# Patient Record
Sex: Female | Born: 1975 | Race: Black or African American | Hispanic: No | Marital: Married | Smoking: Never smoker
Health system: Southern US, Community
[De-identification: ages and names within clinical notes are randomized; demographics above are authoritative.]

## PROBLEM LIST (undated history)

## (undated) DIAGNOSIS — C50919 Malignant neoplasm of unspecified site of unspecified female breast: Secondary | ICD-10-CM

## (undated) DIAGNOSIS — D219 Benign neoplasm of connective and other soft tissue, unspecified: Secondary | ICD-10-CM

## (undated) DIAGNOSIS — D649 Anemia, unspecified: Secondary | ICD-10-CM

## (undated) HISTORY — PX: BREAST RECONSTRUCTION: SHX9

## (undated) HISTORY — PX: TUBAL LIGATION: SHX77

## (undated) HISTORY — PX: MASTECTOMY: SHX3

## (undated) HISTORY — PX: BREAST REDUCTION SURGERY: SHX8

## (undated) HISTORY — PX: GASTRIC BYPASS: SHX52

---
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
## 2012-01-25 ENCOUNTER — Encounter (HOSPITAL_COMMUNITY): Payer: Self-pay | Admitting: *Deleted

## 2012-01-25 ENCOUNTER — Emergency Department (HOSPITAL_COMMUNITY)
Admission: EM | Admit: 2012-01-25 | Discharge: 2012-01-25 | Disposition: A | Discharge status: Home / Self Care | Payer: Self-pay | Attending: Emergency Medicine | Admitting: Emergency Medicine

## 2012-01-25 DIAGNOSIS — Z853 Personal history of malignant neoplasm of breast: Secondary | ICD-10-CM | POA: Insufficient documentation

## 2012-01-25 DIAGNOSIS — N390 Urinary tract infection, site not specified: Secondary | ICD-10-CM | POA: Insufficient documentation

## 2012-01-25 HISTORY — DX: Malignant neoplasm of unspecified site of unspecified female breast: C50.919

## 2012-01-25 LAB — URINE MICROSCOPIC-ADD ON

## 2012-01-25 LAB — PREGNANCY, URINE: Preg Test, Ur: NEGATIVE

## 2012-01-25 LAB — URINALYSIS, ROUTINE W REFLEX MICROSCOPIC
Bilirubin Urine: NEGATIVE
Glucose, UA: NEGATIVE mg/dL
Ketones, ur: NEGATIVE mg/dL
Nitrite: NEGATIVE
Protein, ur: NEGATIVE mg/dL
Specific Gravity, Urine: 1.018 (ref 1.005–1.030)
Urobilinogen, UA: 1 mg/dL (ref 0.0–1.0)
pH: 7 (ref 5.0–8.0)

## 2012-01-25 MED ORDER — CEPHALEXIN 500 MG PO CAPS: 500.0000 mg | ORAL_CAPSULE | Freq: Four times a day (QID) | ORAL | Status: AC

## 2012-01-25 MED ORDER — PHENAZOPYRIDINE HCL 100 MG PO TABS
200.0000 mg | ORAL_TABLET | Freq: Once | ORAL | Status: AC
  Administered 2012-01-25: 200 mg via ORAL
  Filled 2012-01-25: qty 2

## 2012-01-25 MED ORDER — PHENAZOPYRIDINE HCL 200 MG PO TABS: 200.0000 mg | ORAL_TABLET | Freq: Three times a day (TID) | ORAL | Status: AC

## 2012-01-25 MED ORDER — CEPHALEXIN 250 MG PO CAPS
500.0000 mg | ORAL_CAPSULE | Freq: Once | ORAL | Status: AC
  Administered 2012-01-25: 500 mg via ORAL
  Filled 2012-01-25: qty 2

## 2012-01-25 NOTE — ED Provider Notes (Signed)
History    This chart was scribed for Suzi Roots, MD, MD by Smitty Pluck. The patient was seen in room STRE6 and the patient's care was started at 11:53AM.   CSN: [id number]  Arrival date & time 01/25/12  1123   First MD Initiated Contact with Patient 01/25/12 1149      Chief Complaint  Patient presents with  . Urinary Tract Infection    (Consider location/radiation/quality/duration/timing/severity/associated sxs/prior treatment) Patient is a 36 y.o. female presenting with urinary tract infection. The history is provided by the patient.  Urinary Tract Infection   Cheryl Holloway is a 36 y.o. female who presents to the Emergency Department complaining of moderate dysuria onset 1 week ago. Denies back pain, vomiting and nausea. Reports LMP was beginning of this month. Pt reports having vaginal spotting. Denies abdominal pain. Reports increased pressure when she voids. Symptoms have been constant since onset without radiation. Denies burning while urinating. Denies any other pain.  No abd pain. No vaginal discharge. No nv. No flank pain. No fever or chills. Hx uncomplicated uti yrs ago.     Past Medical History  Diagnosis Date  . Breast cancer     Past Surgical History  Procedure Date  . Mastectomy   . Breast reconstruction   . Breast reduction surgery     No family history on file.  History  Substance Use Topics  . Smoking status: Never Smoker   . Smokeless tobacco: Not on file  . Alcohol Use: Yes    OB History    Grav Para Term Preterm Abortions TAB SAB Ect Mult Living                  Review of Systems  Constitutional: Negative for fever and chills.  Respiratory: Negative for cough.   Gastrointestinal: Negative for nausea, vomiting and diarrhea.  Genitourinary: Positive for dysuria. Negative for urgency, frequency and flank pain.    Allergies  Review of patient's allergies indicates no known allergies.  Home Medications   Current Outpatient Rx    Name Route Sig Dispense Refill  . OMEGA-3 FATTY ACIDS 1000 MG PO CAPS Oral Take 1 g by mouth daily.    . IRON PO Oral Take 1 tablet by mouth daily.    . ADULT MULTIVITAMIN W/MINERALS CH Oral Take 1 tablet by mouth daily.    Marland Kitchen TAMOXIFEN CITRATE 20 MG PO TABS Oral Take 20 mg by mouth daily.    Marland Kitchen VITAMIN B-12 500 MCG PO TABS Oral Take 500 mcg by mouth daily.    Marland Kitchen VITAMIN E PO Oral Take 1 capsule by mouth daily.      BP 132/88  Pulse 69  Temp(Src) 98.2 F (36.8 C) (Oral)  Resp 16  SpO2 99%  Physical Exam  Nursing note and vitals reviewed. Constitutional: She is oriented to person, place, and time. She appears well-developed and well-nourished. No distress.  HENT:  Head: Normocephalic and atraumatic.  Eyes: Pupils are equal, round, and reactive to light.  Neck: Normal range of motion. Neck supple. No tracheal deviation present.  Cardiovascular: Normal rate.   Pulmonary/Chest: Effort normal. No respiratory distress.  Abdominal: Soft. Bowel sounds are normal. She exhibits no distension. There is no tenderness. There is no CVA tenderness.  Genitourinary:       No cva tenderness  Musculoskeletal: She exhibits no edema.  Neurological: She is alert and oriented to person, place, and time.  Skin: Skin is warm and dry.  Psychiatric: She has a  normal mood and affect. Her behavior is normal.    ED Course  Procedures (including critical care time) DIAGNOSTIC STUDIES: Oxygen Saturation is 99% on room air, normal by my interpretation.    COORDINATION OF CARE: 11:57AM EDP discusses pt ED treatment with pt.  12:41PM EDP orders medication: keflex 500 mg, pyridium 200 mg.  Labs Reviewed  URINALYSIS, ROUTINE W REFLEX MICROSCOPIC - Abnormal; Notable for the following:    APPearance CLOUDY (*)    Hgb urine dipstick TRACE (*)    Leukocytes, UA LARGE (*)    All other components within normal limits  URINE MICROSCOPIC-ADD ON - Abnormal; Notable for the following:    Squamous Epithelial / LPF  FEW (*)    Bacteria, UA FEW (*)    All other components within normal limits  PREGNANCY, URINE     MDM  I personally performed the services described in this documentation, which was scribed in my presence. The recorded information has been reviewed and considered. Suzi Roots, MD   Pos ua. Nkda. Keflex po. Pyridium po.   Discussed labs w pt.        Suzi Roots, MD 01/25/12 1314

## 2012-01-25 NOTE — ED Notes (Signed)
Patient states she has discomfort and pressure when she voids.  She reports sx for 1 week

## 2012-01-25 NOTE — Discharge Instructions (Signed)
Take antibiotic (keflex) as prescribed. Take pyridium as need for bladder spasm/pain. Take motrin or aleve as need. Follow up with primary care doctor in the next few days if symptoms fail to improve/resolve. Return to ER if worse, severe abdominal pain, vomiting, fevers, other concern.      Urinary Tract Infection Infections of the urinary tract can start in several places. A bladder infection (cystitis), a kidney infection (pyelonephritis), and a prostate infection (prostatitis) are different types of urinary tract infections (UTIs). They usually get better if treated with medicines (antibiotics) that kill germs. Take all the medicine until it is gone. You or your child may feel better in a few days, but TAKE ALL MEDICINE or the infection may not respond and may become more difficult to treat. HOME CARE INSTRUCTIONS   Drink enough water and fluids to keep the urine clear or pale yellow. Cranberry juice is especially recommended, in addition to large amounts of water.   Avoid caffeine, tea, and carbonated beverages. They tend to irritate the bladder.   Alcohol may irritate the prostate.   Only take over-the-counter or prescription medicines for pain, discomfort, or fever as directed by your caregiver.  To prevent further infections:  Empty the bladder often. Avoid holding urine for long periods of time.   After a bowel movement, women should cleanse from front to back. Use each tissue only once.   Empty the bladder before and after sexual intercourse.  FINDING OUT THE RESULTS OF YOUR TEST Not all test results are available during your visit. If your or your child's test results are not back during the visit, make an appointment with your caregiver to find out the results. Do not assume everything is normal if you have not heard from your caregiver or the medical facility. It is important for you to follow up on all test results. SEEK MEDICAL CARE IF:   There is back pain.   Your baby  is older than 3 months with a rectal temperature of 100.5 F (38.1 C) or higher for more than 1 day.   Your or your child's problems (symptoms) are no better in 3 days. Return sooner if you or your child is getting worse.  SEEK IMMEDIATE MEDICAL CARE IF:   There is severe back pain or lower abdominal pain.   You or your child develops chills.   You have a fever.   Your baby is older than 3 months with a rectal temperature of 102 F (38.9 C) or higher.   Your baby is 25 months old or younger with a rectal temperature of 100.4 F (38 C) or higher.   There is nausea or vomiting.   There is continued burning or discomfort with urination.  MAKE SURE YOU:   Understand these instructions.   Will watch your condition.   Will get help right away if you are not doing well or get worse.  Document Released: 05/12/2005 Document Revised: 07/22/2011 Document Reviewed: 12/15/2006 [facility] Patient Information 2012 [place], [place].

## 2013-08-20 ENCOUNTER — Emergency Department (HOSPITAL_COMMUNITY)
Admission: EM | Admit: 2013-08-20 | Discharge: 2013-08-20 | Disposition: A | Discharge status: Home / Self Care | Payer: Self-pay | Attending: Emergency Medicine | Admitting: Emergency Medicine

## 2013-08-20 ENCOUNTER — Encounter (HOSPITAL_COMMUNITY): Payer: Self-pay | Admitting: Emergency Medicine

## 2013-08-20 DIAGNOSIS — R61 Generalized hyperhidrosis: Secondary | ICD-10-CM | POA: Insufficient documentation

## 2013-08-20 DIAGNOSIS — C50919 Malignant neoplasm of unspecified site of unspecified female breast: Secondary | ICD-10-CM | POA: Insufficient documentation

## 2013-08-20 DIAGNOSIS — R634 Abnormal weight loss: Secondary | ICD-10-CM | POA: Insufficient documentation

## 2013-08-20 DIAGNOSIS — R5381 Other malaise: Secondary | ICD-10-CM | POA: Insufficient documentation

## 2013-08-20 DIAGNOSIS — R110 Nausea: Secondary | ICD-10-CM | POA: Insufficient documentation

## 2013-08-20 DIAGNOSIS — R5383 Other fatigue: Secondary | ICD-10-CM | POA: Insufficient documentation

## 2013-08-20 DIAGNOSIS — N644 Mastodynia: Secondary | ICD-10-CM | POA: Insufficient documentation

## 2013-08-20 DIAGNOSIS — Z3202 Encounter for pregnancy test, result negative: Secondary | ICD-10-CM | POA: Insufficient documentation

## 2013-08-20 LAB — URINALYSIS, ROUTINE W REFLEX MICROSCOPIC
BILIRUBIN URINE: NEGATIVE
GLUCOSE, UA: NEGATIVE mg/dL
Hgb urine dipstick: NEGATIVE
Ketones, ur: NEGATIVE mg/dL
LEUKOCYTES UA: NEGATIVE
NITRITE: NEGATIVE
PH: 6 (ref 5.0–8.0)
Protein, ur: NEGATIVE mg/dL
SPECIFIC GRAVITY, URINE: 1.017 (ref 1.005–1.030)
Urobilinogen, UA: 0.2 mg/dL (ref 0.0–1.0)

## 2013-08-20 LAB — COMPREHENSIVE METABOLIC PANEL
ALBUMIN: 2.8 g/dL — AB (ref 3.5–5.2)
ALK PHOS: 71 U/L (ref 39–117)
ALT: 53 U/L — ABNORMAL HIGH (ref 0–35)
AST: 114 U/L — AB (ref 0–37)
BUN: 9 mg/dL (ref 6–23)
CHLORIDE: 102 meq/L (ref 96–112)
CO2: 26 mEq/L (ref 19–32)
Calcium: 7.9 mg/dL — ABNORMAL LOW (ref 8.4–10.5)
Creatinine, Ser: 0.68 mg/dL (ref 0.50–1.10)
GFR calc Af Amer: >90 mL/min (ref >90)
GFR calc non Af Amer: >90 mL/min (ref >90)
Glucose, Bld: 99 mg/dL (ref 70–99)
POTASSIUM: 3.8 meq/L (ref 3.7–5.3)
Sodium: 140 mEq/L (ref 137–147)
TOTAL PROTEIN: 6.4 g/dL (ref 6.0–8.3)
Total Bilirubin: 0.5 mg/dL (ref 0.3–1.2)

## 2013-08-20 LAB — CBC WITH DIFFERENTIAL/PLATELET
BASOS ABS: 0 10*3/uL (ref 0.0–0.1)
BASOS PCT: 1 % (ref 0–1)
EOS ABS: 0 10*3/uL (ref 0.0–0.7)
Eosinophils Relative: 0 % (ref 0–5)
HCT: 31.5 % — ABNORMAL LOW (ref 36.0–46.0)
HEMOGLOBIN: 10.9 g/dL — AB (ref 12.0–15.0)
Lymphocytes Relative: 22 % (ref 12–46)
Lymphs Abs: 0.9 10*3/uL (ref 0.7–4.0)
MCH: 27.1 pg (ref 26.0–34.0)
MCHC: 34.6 g/dL (ref 30.0–36.0)
MCV: 78.4 fL (ref 78.0–100.0)
MONOS PCT: 8 % (ref 3–12)
Monocytes Absolute: 0.3 10*3/uL (ref 0.1–1.0)
NEUTROS ABS: 2.7 10*3/uL (ref 1.7–7.7)
NEUTROS PCT: 69 % (ref 43–77)
PLATELETS: 255 10*3/uL (ref 150–400)
RBC: 4.02 MIL/uL (ref 3.87–5.11)
RDW: 15.1 % (ref 11.5–15.5)
WBC: 3.9 10*3/uL — ABNORMAL LOW (ref 4.0–10.5)

## 2013-08-20 LAB — PREGNANCY, URINE: Preg Test, Ur: NEGATIVE

## 2013-08-20 LAB — LIPASE, BLOOD: LIPASE: 20 U/L (ref 11–59)

## 2013-08-20 NOTE — Discharge Instructions (Signed)
Fatigue °Fatigue is a feeling of tiredness, lack of energy, lack of motivation, or feeling tired all the time. Having enough rest, good nutrition, and reducing stress will normally reduce fatigue. Consult your caregiver if it persists. The nature of your fatigue will help your caregiver to find out its cause. The treatment is based on the cause.  °CAUSES  °There are many causes for fatigue. Most of the time, fatigue can be traced to one or more of your habits or routines. Most causes fit into one or more of three general areas. They are: °Lifestyle problems °· Sleep disturbances. °· Overwork. °· Physical exertion. °· Unhealthy habits. °· Poor eating habits or eating disorders. °· Alcohol and/or drug use . °· Lack of proper nutrition (malnutrition). °Psychological problems °· Stress and/or anxiety problems. °· Depression. °· Grief. °· Boredom. °Medical Problems or Conditions °· Anemia. °· Pregnancy. °· Thyroid gland problems. °· Recovery from major surgery. °· Continuous pain. °· Emphysema or asthma that is not well controlled °· Allergic conditions. °· Diabetes. °· Infections (such as mononucleosis). °· Obesity. °· Sleep disorders, such as sleep apnea. °· Heart failure or other heart-related problems. °· Cancer. °· Kidney disease. °· Liver disease. °· Effects of certain medicines such as antihistamines, cough and cold remedies, prescription pain medicines, heart and blood pressure medicines, drugs used for treatment of cancer, and some antidepressants. °SYMPTOMS  °The symptoms of fatigue include:  °· Lack of energy. °· Lack of drive (motivation). °· Drowsiness. °· Feeling of indifference to the surroundings. °DIAGNOSIS  °The details of how you feel help guide your caregiver in finding out what is causing the fatigue. You will be asked about your present and past health condition. It is important to review all medicines that you take, including prescription and non-prescription items. A thorough exam will be done.  You will be questioned about your feelings, habits, and normal lifestyle. Your caregiver may suggest blood tests, urine tests, or other tests to look for common medical causes of fatigue.  °TREATMENT  °Fatigue is treated by correcting the underlying cause. For example, if you have continuous pain or depression, treating these causes will improve how you feel. Similarly, adjusting the dose of certain medicines will help in reducing fatigue.  °HOME CARE INSTRUCTIONS  °· Try to get the required amount of good sleep every night. °· Eat a healthy and nutritious diet, and drink enough water throughout the day. °· Practice ways of relaxing (including yoga or meditation). °· Exercise regularly. °· Make plans to change situations that cause stress. Act on those plans so that stresses decrease over time. Keep your work and personal routine reasonable. °· Avoid street drugs and minimize use of alcohol. °· Start taking a daily multivitamin after consulting your caregiver. °SEEK MEDICAL CARE IF:  °· You have persistent tiredness, which cannot be accounted for. °· You have fever. °· You have unintentional weight loss. °· You have headaches. °· You have disturbed sleep throughout the night. °· You are feeling sad. °· You have constipation. °· You have dry skin. °· You have gained weight. °· You are taking any new or different medicines that you suspect are causing fatigue. °· You are unable to sleep at night. °· You develop any unusual swelling of your legs or other parts of your body. °SEEK IMMEDIATE MEDICAL CARE IF:  °· You are feeling confused. °· Your vision is blurred. °· You feel faint or pass out. °· You develop severe headache. °· You develop severe abdominal, pelvic, or   back pain. °· You develop chest pain, shortness of breath, or an irregular or fast heartbeat. °· You are unable to pass a normal amount of urine. °· You develop abnormal bleeding such as bleeding from the rectum or you vomit blood. °· You have thoughts  about harming yourself or committing suicide. °· You are worried that you might harm someone else. °MAKE SURE YOU:  °· Understand these instructions. °· Will watch your condition. °· Will get help right away if you are not doing well or get worse. °Document Released: 05/30/2007 Document Revised: 10/25/2011 Document Reviewed: 05/30/2007 °ExitCare® Patient Information ©2014 [employer]. ° ° ° °Emergency Department Resource Guide °1) Find a Doctor and Pay Out of Pocket °Although you won't have to find out who is covered by your insurance plan, it is a good idea to ask around and get recommendations. You will then need to call the office and see if the doctor you have chosen will accept you as a new patient and what types of options they offer for patients who are self-pay. Some doctors offer discounts or will set up payment plans for their patients who do not have insurance, but you will need to ask so you aren't surprised when you get to your appointment. ° °2) Contact Your Local Health Department °Not all health departments have doctors that can see patients for sick visits, but many do, so it is worth a call to see if yours does. If you don't know where your local health department is, you can check in your phone book. The CDC also has a tool to help you locate your state's health department, and many state websites also have listings of all of their local health departments. ° °3) Find a Walk-in Clinic °If your illness is not likely to be very severe or complicated, you may want to try a walk in clinic. These are popping up all over the country in pharmacies, drugstores, and shopping centers. They're usually staffed by nurse practitioners or physician assistants that have been trained to treat common illnesses and complaints. They're usually fairly quick and inexpensive. However, if you have serious medical issues or chronic medical problems, these are probably not your best option. ° °No Primary Care  Doctor: °- Call [facility] at  832-8000 - they can help you locate a primary care doctor that  accepts your insurance, provides certain services, etc. °- Physician Referral Service- 1-800-533-3463 ° °Chronic Pain Problems: °Organization         Address  Phone   Notes  ° [facility]  (336) 297-2271 Patients need to be referred by their primary care doctor.  ° °Medication Assistance: °Organization         Address  Phone   Notes  °[place] [facility] [address] °[place], [place] [address] (336) 641-8030 --Must be a resident of [place] °-- Must have NO insurance coverage whatsoever (no Medicaid/ Medicare, etc.) °-- The pt. MUST have a primary care doctor that directs their care regularly and follows them in the community °  °[facility]  (866) 331-1348   °[employer]  (888) 892-1162   ° °Agencies that provide inexpensive medical care: °Organization         Address  Phone   Notes  °[facility]  (336) 832-8035   °[facility]    (336) 832-7272   °[facility] [address] °[place], [place] [address] (336) 832-4777   °  [facility] [address], °[place] (336) 271-4999   °[facility]    (336) 373-0678   °[facility]    (336) 272-1050   °[facility] ° [address], [place] Phone:  (336) 832-4444, Fax:  (336) 832-4440 Hours of Operation:  9 am - 6 pm, M-F.  Also accepts Medicaid/Medicare and self-pay.  °[facility] ° [address], [place] Phone: (336) 832-3150, Fax: (336) 832-3151. Hours of Operation:  8:30 am - 5:30 pm, M-F.  Also accepts Medicaid and self-pay.  °[facility] [place] [address], [place] Phone: (336) 878-6027   °[facility] [address], [place], [place] (336)723-1848, Ext. 123 Mondays & Thursdays: 7-9 AM.  First 15 patients are seen on a first  come, first serve basis. °  ° °Medicaid-accepting [place] Providers: ° °Organization         Address  Phone   Notes  °[facility] [address], [place] (336) 641-2100 Also accepts self-pay patients.  °[facility] [address], [place] ° (336) 856-9996   °[facility] [address], [place] (336) 288-8857   °[facility] [address] [place] [address], [place] (336) 299-7000   °Veita Bland [address], [place]  ° (336) 373-1557 Only accepts [place] Access Medicaid patients after they have their name applied to their card.  ° °Self-Pay (no insurance) in [place]: ° °Organization         Address  Phone   Notes  °Sickle Cell Patients, [facility] [address], [place] (336) 832-1970   °[facility] [address], [place] (336) 832-4400   °[facility] ° [address], [place] (336) 992-4800   °[facility]/Dr. Osei-Bonsu ° [address] [place] [address], [place] or [address], [place] (336) 841-8500 Phone number for both [place] and [place] locations is the same.  °[facility] [address], [place] (336) 299-0000   °[facility] [address] [place] [address], [place] or [address] (336) 852-7530 °(336) 878-2260   °[facility] [address], [place] (336) 350-1642, phone; (336) 294-5005, fax Sees patients 1st and 3rd Saturday of every month.  Must not qualify for public or private insurance (i.e. Medicaid, Medicare, [place] Health Choice, Veterans' Benefits) • Household income should be no more than 200% of the poverty level •The clinic cannot treat you if you are pregnant or think you are pregnant • Sexually transmitted diseases are not treated at the clinic.  ° ° °Dental Care: °Organization          Address  Phone  Notes  °[facility] [address],  (336) 641-6152 Accepts children up to age 21 who are enrolled in Medicaid or [place] Health Choice; pregnant women with a Medicaid card; and children who have applied for Medicaid or [place] Health Choice, but were declined, whose parents can pay a reduced fee at time of service.  °[facility] [place]  [address], [place] (336) 641-7733 Accepts children up to age 21 who are enrolled in Medicaid or  Health Choice; pregnant women with a Medicaid card; and   children who have applied for Medicaid or [place] Health Choice, but were declined, whose parents can pay a reduced fee at time of service.  °[facility] ° [address], [place] (336) 641-4533 Patients are seen by appointment only. Walk-ins are not accepted. [facility] will see patients 18 years of age and older. °Monday - Tuesday (8am-5pm) °Most Wednesdays (8:30-5pm) °$30 per visit, cash only  °[facility] ° [address], [place] (336) 641-4533 Patients are seen by appointment only. Walk-ins are not accepted. [facility] will see patients 18 years of age and older. °One Wednesday Evening (Monthly: Volunteer Based).  $30 per visit, cash only  °[facility]  (919) 537-3737 for adults; Children under age 4, call Graduate Pediatric Dentistry at (919) [phone]. Children aged 4-14, please call (919) [phone] to request a pediatric application. ° Dental services are provided in all areas of dental care including fillings, crowns and bridges, complete and partial dentures, implants, gum treatment, root canals, and extractions. Preventive care is also provided. Treatment is provided to both adults and children. °Patients are selected via a lottery and there is often a waiting list. °  °[facility] [address], °[place] ° (336) 763-8833 [email or web address] °  °[facility] [address], [place], [place] (336)723-1848, Ext. 123 Second and Fourth Thursday of each month, opens at 6:30 AM; Clinic ends at 9 AM.  Patients are seen on a first-come first-served basis, and a limited number are seen during each clinic.  ° °[facility] ° [address], [place], [place] (336) 723-7904   Eligibility Requirements °You must have lived in [place], [place], or [place] counties for at least the last three months. °  You cannot be eligible for state or federal sponsored healthcare insurance, including Veterans Administration, Medicaid, or Medicare. °  You generally cannot be eligible for healthcare insurance through your employer.  °  How to apply: °Eligibility screenings are held every Tuesday and Wednesday afternoon from 1:00 pm until 4:00 pm. You do not need an appointment for the interview!  °[facility] [address], [place], [place] 336-631-2330   °[facility]  336-342-8273   °[facility]  336-703-3100   °[facility]  336-570-6415   ° °Behavioral Health Resources in the Community: °Intensive Outpatient Programs °Organization         Address  Phone  Notes  °[facility] [address], [place], [place] 336-878-6098   °[facility] [address], [place], [place] 336-832-9800   °[facility] [address], [place], [place] ° 336-882-2125   °[facility] [address],  °[place],  1-800-853-5163 or 336-641-4981   °Substance Abuse Resources °Organization         Address  Phone  Notes  °[facility]  336-882-2125   °[facility]  336-784-9470   °[facility]  336-285-9073   °[facility]  336-845-3988   °Residential & Outpatient Substance Abuse Program  1-800-659-3381   °Psychological  Services °Organization         Address  Phone  Notes  °[facility]  336- 832-9600   °[facility]  336- 378-7881   °[facility] [address], [place] 1-800-853-5163 or 336-641-4981   ° °Mobile Crisis Teams °Organization           Address  Phone  Notes  °[facility], Mobile Crisis Care Unit  1-877-626-1772   °Assertive °[facility] ° [address] [place], [place] 336-834-9664   °Sharon DeEsch [address] °[place] 336-554-5454   ° °Self-Help/Support Groups °Organization         Address  Phone             Notes  °[facility] - variety of support groups  336- 373-1402 Call for more information  °Narcotics Anonymous (NA), [facility] [address], °[place]  2 meetings at this location  ° °Residential Treatment Programs °Organization         Address  Phone  Notes  °[facility] [address],    °[place]  1-866-801-8205   °[facility] ° [address], [place], [place] 704-293-8524   °[facility] [address], [place] 336-845-3988 Admissions: 8am-3pm M-F  °[facility] [address]    °[place], [place] 336-841-1104   °[facility] [address], [place], [place] 336-379-7146   °[facility] [address]  °[place], [place] 336-285-9073   °[facility] - Intensive Outpatient [address], [place], [place] 336-852-3033   °[facility] [address]  °[place], [place] 1-877-615-2722 or 336-784-9470   °[facility] [address] [place], [place] 336-227-7417 Accepts Medicaid  °[facility] [address]  ° [place] 1-800-659-3381 Substance Abuse/Addiction Treatment  ° °[place] Behavioral Health Resources °Organization         Address  Phone  Notes  °[facility]  (888)  581-9988   °Julie Brannon, PhD [address] [place], [place]   (336) 349-5553 or (336) 951-0000   °[facility]   [address] °[place], [place] (336) 349-4454   °[facility] [address], [place], [place] (336) 342-8316 Insurance/Medicaid/sponsorship through [facility]  °[facility] [address]                                    [place], [place] (336) 342-8316 Therapy/tele-psych/case  °[facility] [address]  ° [place], [place] (336) 349-2233    °Dr. Arfeen  (336) 349-4544   °[facility] [place]  [employer] [place] [facility] 1) [address], [place] °2) [address], [place] °3)  [address], [place] (336) 349-3220 °(336) 342-7768 ° °(336) 342-8140   °[place] Child [facility] (336) 342-1394 or (336) 342-3537 (After Hours)    ° °  °

## 2013-08-20 NOTE — ED Notes (Signed)
Pt sts hx of breast CA and now having weight loss, breast pain, nausea and not feeling well

## 2013-08-20 NOTE — ED Provider Notes (Signed)
CSN: [id number]     Arrival date & time 08/20/13  0747 History   First MD Initiated Contact with Patient 08/20/13 [phone]     Chief Complaint  Patient presents with  . Weight Loss  . Breast Pain   (Consider location/radiation/quality/duration/timing/severity/associated sxs/prior Treatment) Patient is a 38 y.o. female presenting with general illness.  Illness Quality:  Fatigue Severity:  Moderate Onset quality:  Gradual Duration: several months. Timing:  Constant Progression:  Worsening Chronicity:  New Context:  Hx of breast CA, diagnosed 2011, remission 2012, has not followed up since moving to [place]. Relieved by:  Nothing Worsened by:  Nothing Associated symptoms: nausea   Associated symptoms: no abdominal pain, no chest pain, no fever, no shortness of breath and no vomiting   Associated symptoms comment:  Sweats, 15 lb weight loss over several months, bilateral breast pain   Past Medical History  Diagnosis Date  . Breast cancer    Past Surgical History  Procedure Laterality Date  . Mastectomy    . Breast reconstruction    . Breast reduction surgery     History reviewed. No pertinent family history. History  Substance Use Topics  . Smoking status: Never Smoker   . Smokeless tobacco: Not on file  . Alcohol Use: Yes   OB History   Grav Para Term Preterm Abortions TAB SAB Ect Mult Living                 Review of Systems  Constitutional: Negative for fever.  Respiratory: Negative for shortness of breath.   Cardiovascular: Negative for chest pain.  Gastrointestinal: Positive for nausea. Negative for vomiting and abdominal pain.  All other systems reviewed and are negative.    Allergies  Review of patient's allergies indicates no known allergies.  Home Medications   Current Outpatient Rx  Name  Route  Sig  Dispense  Refill  . fish oil-omega-3 fatty acids 1000 MG capsule   Oral   Take 1 g by mouth daily.         . IRON PO   Oral   Take 1 tablet by  mouth daily.         . Multiple Vitamin (MULTIVITAMIN WITH MINERALS) TABS   Oral   Take 1 tablet by mouth daily.         . vitamin B-12 (CYANOCOBALAMIN) 500 MCG tablet   Oral   Take 500 mcg by mouth daily.         Marland Kitchen VITAMIN E PO   Oral   Take 1 capsule by mouth daily.          BP 146/79  Pulse 64  Temp(Src) 98.2 F (36.8 C) (Oral)  Resp 17  Wt 177 lb (80.287 kg)  SpO2 98% Physical Exam  Nursing note and vitals reviewed. Constitutional: She is oriented to person, place, and time. She appears well-developed and well-nourished. No distress.  HENT:  Head: Normocephalic and atraumatic.  Mouth/Throat: Oropharynx is clear and moist.  Eyes: Conjunctivae are normal. Pupils are equal, round, and reactive to light. No scleral icterus.  Neck: Neck supple.  Cardiovascular: Normal rate, regular rhythm, normal heart sounds and intact distal pulses.   No murmur heard. Pulmonary/Chest: Effort normal and breath sounds normal. No stridor. No respiratory distress. She has no rales.  Abdominal: Soft. Bowel sounds are normal. She exhibits no distension. There is no tenderness.  Musculoskeletal: Normal range of motion.  Neurological: She is alert and oriented to person, place, and time.  Skin: Skin is warm and dry. No rash noted.  Psychiatric: She has a normal mood and affect. Her behavior is normal.    ED Course  Procedures (including critical care time) Labs Review Labs Reviewed  CBC WITH DIFFERENTIAL - Abnormal; Notable for the following:    WBC 3.9 (*)    Hemoglobin 10.9 (*)    HCT 31.5 (*)    All other components within normal limits  COMPREHENSIVE METABOLIC PANEL - Abnormal; Notable for the following:    Calcium 7.9 (*)    Albumin 2.8 (*)    AST 114 (*)    ALT 53 (*)    All other components within normal limits  LIPASE, BLOOD  URINALYSIS, ROUTINE W REFLEX MICROSCOPIC  PREGNANCY, URINE   Imaging Review No results found.  EKG Interpretation    Date/Time:  Monday  August 20 2013 08:00:06 EST Ventricular Rate:  67 PR Interval:  138 QRS Duration: 84 QT Interval:  376 QTC Calculation: 397 R Axis:   59 Text Interpretation:  Normal sinus rhythm Normal ECG No old tracing to compare Confirmed by [facility]  MD, TREY ([id number]) on 08/20/2013 10:50:10 AM            MDM   1. Fatigue    38 year old female with a history of breast cancer presenting with several months of fatigue and a 15 pound weight loss. She is primarily concerned that her cancer has returned. She is well appearing, in no distress, nontoxic.  Exam notable for post reconstructive breast changes with mild tenderness of her right breast primarily, without detectable masses. Her symptoms have all been present for several months. Her tests show mild anemia, for which she has been taking iron. Her LFTs are mildly elevated, but she has no right upper quadrant abdominal pain. She was advised to followup with the primary doctor as soon as possible to have these tests rechecked and for further chronic health maintenance, especially given her past history.  Houston Siren, MD 08/20/13 [phone]

## 2014-01-10 ENCOUNTER — Other Ambulatory Visit: Payer: Self-pay | Admitting: Internal Medicine

## 2014-01-10 DIAGNOSIS — Z853 Personal history of malignant neoplasm of breast: Secondary | ICD-10-CM

## 2014-01-10 DIAGNOSIS — N6315 Unspecified lump in the right breast, overlapping quadrants: Secondary | ICD-10-CM

## 2014-01-10 DIAGNOSIS — N6310 Unspecified lump in the right breast, unspecified quadrant: Principal | ICD-10-CM

## 2014-01-10 DIAGNOSIS — Z9889 Other specified postprocedural states: Secondary | ICD-10-CM

## 2014-01-10 DIAGNOSIS — R634 Abnormal weight loss: Secondary | ICD-10-CM

## 2014-01-15 ENCOUNTER — Ambulatory Visit
Admission: RE | Admit: 2014-01-15 | Discharge: 2014-01-15 | Disposition: A | Discharge status: Home / Self Care | Payer: 59 | Source: Ambulatory Visit | Attending: Internal Medicine | Admitting: Internal Medicine

## 2014-01-15 DIAGNOSIS — Z853 Personal history of malignant neoplasm of breast: Secondary | ICD-10-CM

## 2014-01-15 DIAGNOSIS — R634 Abnormal weight loss: Secondary | ICD-10-CM

## 2014-01-15 MED ORDER — IOHEXOL 300 MG/ML  SOLN
80.0000 mL | Freq: Once | INTRAMUSCULAR | Status: AC | PRN
  Administered 2014-01-15: 80 mL via INTRAVENOUS

## 2014-01-16 ENCOUNTER — Telehealth: Payer: Self-pay | Admitting: *Deleted

## 2014-01-16 NOTE — Telephone Encounter (Signed)
Called pt and confirmed 01/25/14 appt.  Mailed welcoming packet & intake form to pt.  Asked Dawn to call referring office to make them aware.  Took paperwork to HIM for chart for Dr. Marko Plume.

## 2014-01-18 ENCOUNTER — Other Ambulatory Visit: Payer: Self-pay

## 2014-01-24 ENCOUNTER — Other Ambulatory Visit: Payer: Self-pay | Admitting: *Deleted

## 2014-01-24 ENCOUNTER — Telehealth: Payer: Self-pay | Admitting: *Deleted

## 2014-01-24 DIAGNOSIS — Z853 Personal history of malignant neoplasm of breast: Secondary | ICD-10-CM | POA: Insufficient documentation

## 2014-01-24 NOTE — Telephone Encounter (Signed)
Left message for Katie at Dr. Glenna Durand office requesting past history records.

## 2014-01-25 ENCOUNTER — Other Ambulatory Visit: Payer: Self-pay | Admitting: Obstetrics and Gynecology

## 2014-01-25 ENCOUNTER — Ambulatory Visit: Payer: 59

## 2014-01-25 ENCOUNTER — Encounter: Payer: Self-pay | Admitting: Oncology

## 2014-01-25 ENCOUNTER — Other Ambulatory Visit (HOSPITAL_COMMUNITY)
Admission: RE | Admit: 2014-01-25 | Discharge: 2014-01-25 | Disposition: A | Discharge status: Home / Self Care | Payer: 59 | Source: Ambulatory Visit | Attending: Obstetrics and Gynecology | Admitting: Obstetrics and Gynecology

## 2014-01-25 ENCOUNTER — Other Ambulatory Visit (HOSPITAL_BASED_OUTPATIENT_CLINIC_OR_DEPARTMENT_OTHER): Payer: 59

## 2014-01-25 ENCOUNTER — Telehealth: Payer: Self-pay | Admitting: Oncology

## 2014-01-25 ENCOUNTER — Ambulatory Visit (HOSPITAL_BASED_OUTPATIENT_CLINIC_OR_DEPARTMENT_OTHER): Payer: 59 | Admitting: Oncology

## 2014-01-25 VITALS — BP 129/87 | HR 69 | Temp 98.4°F | Resp 18 | Wt 156.7 lb

## 2014-01-25 DIAGNOSIS — Z9884 Bariatric surgery status: Secondary | ICD-10-CM

## 2014-01-25 DIAGNOSIS — D539 Nutritional anemia, unspecified: Secondary | ICD-10-CM

## 2014-01-25 DIAGNOSIS — Z853 Personal history of malignant neoplasm of breast: Secondary | ICD-10-CM

## 2014-01-25 DIAGNOSIS — Z1151 Encounter for screening for human papillomavirus (HPV): Secondary | ICD-10-CM | POA: Insufficient documentation

## 2014-01-25 DIAGNOSIS — Z01419 Encounter for gynecological examination (general) (routine) without abnormal findings: Secondary | ICD-10-CM | POA: Insufficient documentation

## 2014-01-25 DIAGNOSIS — D649 Anemia, unspecified: Secondary | ICD-10-CM

## 2014-01-25 DIAGNOSIS — Z113 Encounter for screening for infections with a predominantly sexual mode of transmission: Secondary | ICD-10-CM | POA: Insufficient documentation

## 2014-01-25 DIAGNOSIS — N644 Mastodynia: Secondary | ICD-10-CM

## 2014-01-25 DIAGNOSIS — C50911 Malignant neoplasm of unspecified site of right female breast: Secondary | ICD-10-CM

## 2014-01-25 LAB — COMPREHENSIVE METABOLIC PANEL (CC13)
ALBUMIN: 3 g/dL — AB (ref 3.5–5.0)
ALK PHOS: 87 U/L (ref 40–150)
ALT: 62 U/L — ABNORMAL HIGH (ref 0–55)
ANION GAP: 9 meq/L (ref 3–11)
AST: 118 U/L — ABNORMAL HIGH (ref 5–34)
BUN: 8.5 mg/dL (ref 7.0–26.0)
CALCIUM: 7.6 mg/dL — AB (ref 8.4–10.4)
CHLORIDE: 105 meq/L (ref 98–109)
CO2: 25 meq/L (ref 22–29)
Creatinine: 0.8 mg/dL (ref 0.6–1.1)
GLUCOSE: 205 mg/dL — AB (ref 70–140)
POTASSIUM: 4.2 meq/L (ref 3.5–5.1)
SODIUM: 138 meq/L (ref 136–145)
TOTAL PROTEIN: 6.3 g/dL — AB (ref 6.4–8.3)
Total Bilirubin: 0.52 mg/dL (ref 0.20–1.20)

## 2014-01-25 LAB — CBC WITH DIFFERENTIAL/PLATELET
BASO%: 2.2 % — ABNORMAL HIGH (ref 0.0–2.0)
BASOS ABS: 0.1 10*3/uL (ref 0.0–0.1)
EOS ABS: 0 10*3/uL (ref 0.0–0.5)
EOS%: 0 % (ref 0.0–7.0)
HCT: 32 % — ABNORMAL LOW (ref 34.8–46.6)
LYMPH%: 32 % (ref 14.0–49.7)
MCH: 25.8 pg (ref 25.1–34.0)
MCHC: 33.1 g/dL (ref 31.5–36.0)
MCV: 77.9 fL — ABNORMAL LOW (ref 79.5–101.0)
MONO#: 0.3 10*3/uL (ref 0.1–0.9)
MONO%: 8.1 % (ref 0.0–14.0)
NEUT%: 57.7 % (ref 38.4–76.8)
NEUTROS ABS: 2.4 10*3/uL (ref 1.5–6.5)
Platelets: 301 10*3/uL (ref 145–400)
RBC: 4.11 10*6/uL (ref 3.70–5.45)
RDW: 16.5 % — AB (ref 11.2–14.5)
WBC: 4.1 10*3/uL (ref 3.9–10.3)
lymph#: 1.3 10*3/uL (ref 0.9–3.3)

## 2014-01-25 NOTE — Progress Notes (Signed)
[facility] NEW PATIENT EVALUATION   Name: Cheryl Holloway Date: 01/25/2014 MRN: [id number] DOB: April 29, 1976  REFERRING PHYSICIAN: K.Cheryl Holloway CC: Cheryl Holloway, Cheryl Holloway   REASON FOR REFERRAL: question of recurrent breast cancer   HISTORY OF PRESENT ILLNESS:Cheryl Holloway is a 38 y.o. female who is seen in consultation, together with her mother, at the request of  Cheryl Holloway, due to history of right breast cancer, weight loss, pain at right reconstructed breast and lab abnormalities. History is from partial records available from her breast cancer treatment in [place], Cheryl Holloway office notes and history from patient/ mother now. Outside records all reviewed by MD and will be scanned into this EMR.  ONCOLOGIC HISTORY Patient found mass in right breast adjacent to nipple in 04-2009 at age 38. This was 1.8 cm moderately differentiated invasive ductal carcinoma with micrometastais<24m in 1/3 sentinel nodes, associated high grade DCIS, surgical margins widely negative, ER+ PR +, HER 2 negative by FISH, angiolymphatic invasion present at right mastectomy with 3 sentinel nodes ([phone] [facility]-2009, [facility] staging was [id number])pMx.  [place] Cheryl Nettersradiation oncologist Cheryl ShLucy ChrisShe had adjuvant taxotere cytoxan x 4 cycles from Dec 2010 thru 09-26-2009, then tamoxifen from 2011 until "2 years ago", so ~ 2013 when she stopped this due to lack of insurance. She recalls that she tolerated tamoxifen without difficulty, apparently was on Effexor for hot flashes then. She also may have been on zometa q 6 months per outside records, but does not recall this. She tells me that she was BRCA 1 and 2 negative (not in records received). Oncotype Dx from 07-2009 had breast cancer recurrence score of 13. She had implant on right, with significant discomfort there  requiring [place] discomfort described as clawing or pulling under the implant for at least 2 years. In the few notes available from previous physicians, she used percocet for this discomfort. She has never had MRI evaluation of the present implant.   [facility] however only the diagnostic bilateral mammograms is available, as I have spoken directly with that office during patient's visit today. [name] is to follow up with me on Mon 01-28-14. The mammograms, read by Cheryl BeIsaiah Blakesdescribe extremely dense breast tissue and a 1.8 cm oval mass with circumscribed margin and calcification in right breast, in area of palpable mass upper outer reconstruction which patient has recently noticed. Patient tells me that radiologist was not clear if this was the implant and did not want to risk damaging the implant with needle biopsy then.  Patient continues to have regular menses, lasting 7 days and described as very heavy. She is established with Cheryl EvThurnell Loseor gyn.  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 Only labs otherwise from [place] are CBCs on chemo flow sheets, including counts day of first chemo with WBC 5.2, Hgb 9.4 and platelets 330k. She was transfused total of 6 units of PRBCs thru the 4 cycles of chemo. Last CBC [place] was 11-2009 (2 mo after completion of chemo) with WBC 7.7, Hgb 8.7 and plt 268k. On recent labs from Cheryl HuLysle RubensCMET was normal  with exception of alb 3.2 and AST 43, including normal glucose at 86, BUN 10, creat 0.73, AP 62. TSH on 01-10-14 was normal at 2.40. CEA was slightly elevated at 6.2. CBC from 01-10-14 had WBC 4.0, Hgb 9.8, MCV 82 and plt 225k. NOTE SHE IS POST  GASTRIC BYPASS in ~ 2009; she tells me that she has never had B12 injections or IV iron since the bypass. She is on oral iron and B12, which she may not absob due to the bypass. Weight prior to bypass was 230 lbs.      REVIEW OF SYSTEMS: Chest pains under right reconstructed breast >2 years, initially intermittent and now continuous, uses heating pad or motrin prn. Weight down 50 lbs in 3 months despite good appetite, including egg and sausage this AM and 6" chicken sub for lunch; drinks mostly water. No HA or other neurologic symptoms. No corrective lenses. Occasional minimal sinus symptoms. No difficulty hearing. No pain other than at reconstructed right breast. No respiratory or cardiac symptoms. LMP one month ago. No other bleeding. Bowels and bladder ok. Muscle spasms and cramps fingers and legs x 3 months. No hx blood clots.  No fevers, only occasional hot flashes, no night sweats. Remainder of full 10 point review of systems negative.   ALLERGIES: Review of patient's allergies indicates no known allergies.  PAST MEDICAL/ SURGICAL HISTORY:    G4P3 with children ages 32, 19, 67 Right mastectomy with reconstruction 07-02-2009 + additional surgeries for implant x 3. Left reduction mammoplasty Csections x3 Gastric bypass 2009, weight prior to surgery 230 lb  CURRENT MEDICATIONS: reviewed as listed now in EMR    SOCIAL HISTORY: moved to [place] from [place] area due to husband in [employer]; subsequently divorced, relocated to [facility] and has remarried. 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 Never smoker. Children are not in [place]. Mother has relocated to [facility] from [employer]:  Mother DM 3 children healthy 3 half siblings on father's side NO hx gyn cancer in maternal aunt as originally thought          PHYSICAL EXAM:  weight is 156 lb 11.2 oz (71.079 kg). Her oral temperature is 98.4 F (36.9 C). Her blood pressure is 129/87 and her pulse is 69. Her respiration is 18.   Alert, pleasant, cooperative lady looks stated age, not in any acute discomfort, well developed and appears well nourished. Good historian, mother very supportive.  HEENT: normal hair pattern. PERRL, not icteric. Mucous membranes and conjunctivae pale. Oral mucosa and posterior pharynx clear. Neck supple without thyroid mass or JVD.  RESPIRATORY: lungs clear to A and P anteriorly and posteriorly, no use of accessory muscles.  CARDIAC/ VASCULAR: heart RRR without murmur or gallop. Clear heart sounds. Peripheral pulses symmetrical and intact  ABDOMEN: soft, not tender, not distended, well healed surgical incisions. No appreciable HSM or mass. Normally active bowel sounds.  LYMPH NODES: No cervical, supraclavicular, axillary or inguinal adenopathy  BREASTS: right mastectomy with implant, no skin findings of concern, not tender to palpation. In Upper outer quadrant is smoothly rounded bulge ~ 2 cm diameter which is slightly compressible, not firm, feels cystic or possibly bulge in the implant, not tender. Left breast with well healed incisions from reduction mammoplasty, no dominant mass, no skin or nipple findings of concern. Axillae without mass or adenopathy appreciated.  NEUROLOGIC:CN, motor, sensory, cerebellar nonfocal  SKIN: no rash,ecchymosis, petechiae  MUSCULOSKELETAL: back nontender. No swelling either UE. LE without edema, cords,  tenderness. Nailbeds pale.. Good ROM right shoulder    LABORATORY DATA:  Results for orders placed in visit on 01/25/14 (from the past 48 hour(s))  CBC WITH DIFFERENTIAL     Status: Abnormal   Collection Time    01/25/14  3:21 PM      Result Value Ref Range   WBC 4.1  3.9 - 10.3 10e3/uL   NEUT# 2.4  1.5 - 6.5 10e3/uL   HGB 10.6 Repeated and Verified (*) 11.6 - 15.9 g/dL   HCT 32.0 (*) 34.8 - 46.6 %   Platelets 301  145 - 400 10e3/uL   MCV 77.9 (*) 79.5 - 101.0 fL   MCH 25.8  25.1 - 34.0 pg   MCHC 33.1  31.5 - 36.0 g/dL   RBC 4.11  3.70 - 5.45  10e6/uL   RDW 16.5 (*) 11.2 - 14.5 %   lymph# 1.3  0.9 - 3.3 10e3/uL   MONO# 0.3  0.1 - 0.9 10e3/uL   Eosinophils Absolute 0.0  0.0 - 0.5 10e3/uL   Basophils Absolute 0.1  0.0 - 0.1 10e3/uL   NEUT% 57.7  38.4 - 76.8 %   LYMPH% 32.0  14.0 - 49.7 %   MONO% 8.1  0.0 - 14.0 %   EOS% 0.0  0.0 - 7.0 %   BASO% 2.2 (*) 0.0 - 2.0 %  COMPREHENSIVE METABOLIC PANEL (HY85)     Status: Abnormal   Collection Time    01/25/14  3:22 PM      Result Value Ref Range   Sodium 138  136 - 145 mEq/L   Potassium 4.2  3.5 - 5.1 mEq/L   Chloride 105  98 - 109 mEq/L   CO2 25  22 - 29 mEq/L   Glucose 205 (*) 70 - 140 mg/dl   BUN 8.5  7.0 - 26.0 mg/dL   Creatinine 0.8  0.6 - 1.1 mg/dL   Total Bilirubin 0.52  0.20 - 1.20 mg/dL   Alkaline Phosphatase 87  40 - 150 U/L   AST 118 (*) 5 - 34 U/L   ALT 62 (*) 0 - 55 U/L   Total Protein 6.3 (*) 6.4 - 8.3 g/dL   Albumin 3.0 (*) 3.5 - 5.0 g/dL   Calcium 7.6 (*) 8.4 - 10.4 mg/dL   Anion Gap 9  3 - 11 mEq/L     Chemistries drawn just after patient had eaten; note glucose not elevated on recent labs by Cheryl Holloway  PATHOLOGY: From 06-27-2009 [facility] Pathology, [place], [place] (note first letter or digit of accession # difficult to read on faxed copy, may be 0?) information as above and to be scanned into this EMR  RADIOGRAPHY: CT CHEST, ABDOMEN, AND PELVIS WITH CONTRAST  TECHNIQUE:  Multidetector CT imaging of the chest, abdomen and pelvis was  performed following the standard protocol during bolus  administration of intravenous contrast.  CONTRAST: 18m OMNIPAQUE IOHEXOL 300 MG/ML SOLN. Small amount of  contrast extravasation within the right arm. Approximately 10-15 cc  of the saline infiltrated into the arm. The patient was evaluated  with Cheryl. MJobe Igo Normal pulses, capillary refill and sensation.  COMPARISON: None.  FINDINGS:  CT CHEST FINDINGS  Lungs are clear. No effusions. No nodules or masses.  Heart is normal size. Aorta is normal  caliber.  No mediastinal, hilar, or axillary adenopathy. Scattered nodularity  throughout the anterior mediastinum felt represent residual thymus.  Postoperative changes within the right breast. Chest wall soft  tissues are  unremarkable.  No acute bony abnormality or focal bone lesion.  CT ABDOMEN AND PELVIS FINDINGS  Diffuse fatty infiltration of the liver. No focal abnormality.  Gallbladder, spleen, pancreas, adrenals and left kidney are  unremarkable. There is mild prominence of the right renal collecting  system and proximal ureter. No visible stones in the ureter.  Uterus is mildly enlarged. Probable fibroids within the uterus  including exophytic fibroid anteriorly measuring approximately 4.8  cm. No free fluid, free air or adenopathy.  Moderate stool burden in the colon. Small bowel is decompressed.  Prior gastric bypass. Aorta is normal caliber.  Scattered mediastinal and retroperitoneal lymph nodes, none  pathologically enlarged.  No acute bony abnormality or focal bone lesion.  IMPRESSION:  No evidence of metastatic disease in the chest, abdomen or pelvis.  No acute cardiopulmonary disease.  Fatty infiltration of the liver.  Mild prominence of the right renal collecting system and proximal to  mid right ureter, difficult to follow in the pelvis. This may be  related to mass effect as the ureter passes into the pelvis related  to the right ovary and enlarged fibroid uterus. No visible stones.  Moderate stool in the colon.    Bilateral diagnostic mammograms [name]  01-11-14 to be scanned into EMR Right breast US report from 01-11-14 [name] not available today but has been requested.  CT CAP [facility] 10-10-09 to be scanned into EMR  PACs images for newest CT and reports of other imaging reviewed by MD now.   DISCUSSION:  Not clear at this point if any active cancer.  Primary problems from my perspective are 1) discomfort at reconstructed right breast with  new smoothly rounded area upper outer at edge of implant, 2) weight loss reported of 50 lbs in 3 months despite good appetite 3) anemia and 4) muscle spasms hands and legs. Right breast pain/ breast cancer hx: I will get right breast US report from [facility] and have ordered breast MRI, which will evaluate implant, chest wall behind implant and left breast as well as regional nodes. If not metastatic would suggest resuming tamoxifen. Weight loss: will follow weights on same scales. Note TSH normal and appetite excellent. ?if HIV testing appropriate. Anemia: she may be unable to absorb po iron and po B12 due to gastric bypass, and continues to have heavy menses. Due to time of visit today, she will need to come [facility] lab next week for iron studies and B12. If low we will begin parenteral supplementation. Muscle spasms: may be due to free water intake. Suggested gatorade for now.      IMPRESSION / PLAN:  1.history of T1N31mM0 right breast cancer diagnosed 06-2009 at age [age]and premenopausal, ER PR +, HER 2 -, BRCA reportedly negative. Post right mastectomy with three sentinel nodes, adjuvant taxotere carboplatin x 4 thru 09-2009, ~ 2  years of tamoxifen thru 2013. Reestablishing with medical oncology now.  2.discomfort related to right breast implant or other and new palpable area laterally: MRI breast 3.reported weight loss unclear etiology: see above 4.anemia: at least since 2010, with transfusions then. Check iron and B12 levels as above. 5.post gastric bypass 2009, at which time she weighed 230 lbs 6.slight elevation in CEA unclear etiology 7. Elevated glucose just after eating today, normal by other recent chemistries 8.elevated transaminases with fatty liver by CT. Note transfusions, ? Need hepatitis studies     Patient and her mother have had questions answered to their satisfaction and are in  agreement with plan above. They can contact this office for questions or concerns at any time  prior to next scheduled visit in ~ 2 weeks.  Time spent  60 min, including >50% discussion and coordination of care.  No antiemetics, treatment plan or consent needed at this time.  LIVESAY,LENNIS P, MD 01/25/2014 9:08 PM

## 2014-01-25 NOTE — Progress Notes (Signed)
Checked in new patient with no financial issues. She has appt card. She has not seen the dr yet.

## 2014-01-25 NOTE — Telephone Encounter (Signed)
per pof to sch pt appt-per LL to have Melissa to open sch for 6/26-Meliss gave me 1:30 time for pt-need to sch MRI @ GI closed for the day will sch on 6/15-adv pt i will call  with time & date for MRI-pt understood-gave pt copy of sch

## 2014-01-27 DIAGNOSIS — C50919 Malignant neoplasm of unspecified site of unspecified female breast: Secondary | ICD-10-CM | POA: Insufficient documentation

## 2014-01-27 DIAGNOSIS — Z9884 Bariatric surgery status: Secondary | ICD-10-CM | POA: Insufficient documentation

## 2014-01-27 DIAGNOSIS — C50911 Malignant neoplasm of unspecified site of right female breast: Secondary | ICD-10-CM | POA: Insufficient documentation

## 2014-01-27 DIAGNOSIS — Z9889 Other specified postprocedural states: Secondary | ICD-10-CM | POA: Insufficient documentation

## 2014-01-27 DIAGNOSIS — D500 Iron deficiency anemia secondary to blood loss (chronic): Secondary | ICD-10-CM | POA: Insufficient documentation

## 2014-01-28 ENCOUNTER — Telehealth: Payer: Self-pay | Admitting: Oncology

## 2014-01-28 NOTE — Telephone Encounter (Signed)
per pof to sch MRI-cld and sch w/GI-appt time 6/22 @4 -cld pt to give time & date-pt understood-adv of other appts as well

## 2014-01-29 LAB — CYTOLOGY - PAP

## 2014-01-30 ENCOUNTER — Other Ambulatory Visit (HOSPITAL_BASED_OUTPATIENT_CLINIC_OR_DEPARTMENT_OTHER): Payer: 59

## 2014-01-30 DIAGNOSIS — D539 Nutritional anemia, unspecified: Secondary | ICD-10-CM

## 2014-01-30 DIAGNOSIS — Z853 Personal history of malignant neoplasm of breast: Secondary | ICD-10-CM

## 2014-01-30 DIAGNOSIS — D649 Anemia, unspecified: Secondary | ICD-10-CM

## 2014-01-30 LAB — IRON AND TIBC CHCC
%SAT: 63 % — ABNORMAL HIGH (ref 21–57)
IRON: 103 ug/dL (ref 41–142)
TIBC: 164 ug/dL — AB (ref 236–444)
UIBC: 61 ug/dL — ABNORMAL LOW (ref 120–384)

## 2014-01-30 LAB — FERRITIN CHCC: FERRITIN: 95 ng/mL (ref 9–269)

## 2014-01-31 LAB — CANCER ANTIGEN 27.29: CA 27.29: 25 U/mL (ref 0–39)

## 2014-01-31 LAB — VITAMIN B12: Vitamin B-12: 741 pg/mL (ref 211–911)

## 2014-02-01 ENCOUNTER — Encounter: Payer: Self-pay | Admitting: Oncology

## 2014-02-01 NOTE — Progress Notes (Signed)
Medical Oncology  Preliminary [place] report from [facility] 01-11-2014 received and will be scanned into this EMR. In summary: 1.5 cm oval mass right breast, anechoic, appears to be directly associated with and possibly communicating with implant. No increase in vascularity by color flow imaging. "Probably benign. This is not in an area for which percutaneous biopsy is recommended, especially since it may represent a small implant herniation"  L.Avalyn Molino, MD

## 2014-02-04 ENCOUNTER — Ambulatory Visit
Admission: RE | Admit: 2014-02-04 | Discharge: 2014-02-04 | Disposition: A | Discharge status: Home / Self Care | Payer: 59 | Source: Ambulatory Visit | Attending: Oncology | Admitting: Oncology

## 2014-02-04 DIAGNOSIS — Z853 Personal history of malignant neoplasm of breast: Secondary | ICD-10-CM

## 2014-02-04 MED ORDER — GADOBENATE DIMEGLUMINE 529 MG/ML IV SOLN
14.0000 mL | Freq: Once | INTRAVENOUS | Status: AC | PRN
  Administered 2014-02-04: 14 mL via INTRAVENOUS

## 2014-02-07 ENCOUNTER — Other Ambulatory Visit: Payer: Self-pay | Admitting: Oncology

## 2014-02-08 ENCOUNTER — Telehealth: Payer: Self-pay

## 2014-02-08 ENCOUNTER — Telehealth: Payer: Self-pay | Admitting: Oncology

## 2014-02-08 ENCOUNTER — Ambulatory Visit (HOSPITAL_BASED_OUTPATIENT_CLINIC_OR_DEPARTMENT_OTHER): Payer: 59 | Admitting: Oncology

## 2014-02-08 ENCOUNTER — Encounter: Payer: Self-pay | Admitting: Oncology

## 2014-02-08 VITALS — BP 126/73 | HR 76 | Temp 98.2°F | Resp 18 | Ht 66.0 in | Wt 159.0 lb

## 2014-02-08 DIAGNOSIS — R7989 Other specified abnormal findings of blood chemistry: Secondary | ICD-10-CM

## 2014-02-08 DIAGNOSIS — Z170 Estrogen receptor positive status [ER+]: Secondary | ICD-10-CM

## 2014-02-08 DIAGNOSIS — R079 Chest pain, unspecified: Secondary | ICD-10-CM

## 2014-02-08 DIAGNOSIS — D649 Anemia, unspecified: Secondary | ICD-10-CM

## 2014-02-08 DIAGNOSIS — Z9884 Bariatric surgery status: Secondary | ICD-10-CM

## 2014-02-08 DIAGNOSIS — K7689 Other specified diseases of liver: Secondary | ICD-10-CM

## 2014-02-08 DIAGNOSIS — C50919 Malignant neoplasm of unspecified site of unspecified female breast: Secondary | ICD-10-CM

## 2014-02-08 DIAGNOSIS — C50911 Malignant neoplasm of unspecified site of right female breast: Secondary | ICD-10-CM

## 2014-02-08 NOTE — Telephone Encounter (Signed)
[facility] PT APPT SCHEDULE FOR AUG. APPT W/DR SANGER SCHEDULED BY [facility] - PT AWARE OF D/T.

## 2014-02-08 NOTE — Patient Instructions (Signed)
We will make referral to plastic surgeon for removal of the implant: Dr Lyndee Leo [facility] Friday July 10 @ 9:30. Office phone [phone], [place], [address]    You should be seen back by Lavone Orn to consider hepatitis testing if he feels appropriate.  Dr Marko Plume will see you back in 2-3 months to follow up this and the other issues.

## 2014-02-08 NOTE — Telephone Encounter (Signed)
Faxed office note dated 01-25-14, insurance card, demographics, Dr. Mariana Kaufman NPI number, and MRI of Breast from 02-05-14 attention Nichole.

## 2014-02-08 NOTE — Progress Notes (Signed)
OFFICE PROGRESS NOTE   02/08/2014   Physicians:K.Nanda Quinton Referral made to Dr Theodoro Kos  INTERVAL HISTORY:  Patient is seen, together with mother, in follow up of concerns from new patient consultation on 01-25-14, having had breast MRI 02-05-14 and further lab work resulted. The MRI was done because of history of right breast cancer, apparent changes at implant and ongoing significant discomfort at right reconstructed breast, but fortunately does not show any evidence of local recurrence on right and no findings of concern for malignancy in left breast or any adenopathy. The MRI was not ordered with silicone sensitive sequences, however there is probable capsular cyst or implant herniation in area of the palpable change laterally. CA 2729 from 01-30-14 returned in normal range at 25. Even with gastric bypass, B12 returned at 741, iron at 103, %sat 63, and ferritin 95.  Patient has no new or different complaints since seen last. The right chest still has pulling and painful discomfort at and just superior to the right breast reconstruction. She has been eating potatoes and has gained 2.5 lbs by our scales.She has no new pain.   ONCOLOGIC HISTORY Patient found mass in right breast adjacent to nipple in 04-2009 at age 38. 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 Path staging was [id number])pMx. [place] onc[place] JaTor Nettersradiation oncologist Essam ShLucy ChrisShe had adjuvant taxotere cytoxan x 4 cycles from Dec 2010 thru 09-26-2009, then tamoxifen from 2011 until "2 years ago", so ~  2013 when she stopped this due to lack of insurance. She recalls that she tolerated tamoxifen without difficulty, apparently was on Effexor for hot flashes then. She also may have been on zometa q 6 months per outside records, but does not recall this. She tells me that she was BRCA 1 and 2 negative (not in records received). Oncotype Dx from 07-2009 had breast cancer recurrence score of 13.  She had implant on right, with significant discomfort there requiring [place] discomfort described as clawing or pulling under the implant for at least 2 years. In the few notes available from previous physicians, she used percocet for this discomfort. She has never had MRI evaluation of the present implant.   Review of systems as above, also: No fever, no SOB, no change in left breast, appetite still good. Remainder of 10 point Review of Systems negative.  Objective:  Vital signs in last 24 hours:  BP 126/73  Pulse 76  Temp(Src) 98.2 F (36.8 C) (Oral)  Resp 18  Ht 5' 6" (1.676 m)  Wt 159 lb (72.122 kg)  BMI 25.68 kg/m2  LMP 12/30/2013   Alert, oriented and appropriate. Ambulatory without difficulty, NAD. No alopecia  HEENT:PERRL, sclerae not icteric. Oral mucosa moist without lesions, posterior pharynx clear.  Neck supple. No JVD.  Lymphatics:no cervical,suraclavicular, axillary or inguinal adenopathy Resp: clear to auscultation bilaterally and normal percussion bilaterally Cardio: regular rate and rhythm. No gallop. GI: soft, nontender, not distended, no mass or organomegaly. Normally active bowel sounds.  Musculoskeletal/ Extremities: without pitting edema, cords, tenderness. No swelling UE. Neuro: no peripheral neuropathy. Otherwise nonfocal Skin without rash, ecchymosis, petechiae  Breasts: Right reconstruction with unchanged smooth bulge laterally, no skin findings of concern. Left breast with well healed scars from reduction  mammoplasty, without dominant mass, skin or nipple findings. Axillae benign. Lab Results:  Results for orders placed in visit on 01/30/14  FERRITIN [facility]      Result Value Ref Range   Ferritin 95  9 - 269 ng/ml  IRON AND TIBC [facility]      Result Value Ref Range   Iron 103  41 - 142 ug/dL   TIBC 164 (*) 236 - 444 ug/dL   UIBC 61 (*) 120 - 384 ug/dL   %SAT 63 (*) 21 - 57 %  VITAMIN B12      Result Value Ref Range   Vitamin B-12 741  211 - 911 pg/mL  CANCER ANTIGEN 27.29      Result Value Ref Range   CA 27.29 25  0 - 39 U/mL     Studies/Results: BILATERAL BREAST MRI WITH AND WITHOUT CONTRAST 02-05-14.  FINDINGS:  Breast composition: Patient is status post right mastectomy with  implant reconstruction. The left breast demonstrates heterogeneous  fibroglandular tissue.  Background parenchymal enhancement: There is a moderate degree of  background parenchymal enhancement with scattered foci of non mass  enhancement with benign MR features in the left breast.  Right breast: Status post right mastectomy with silicone implant  reconstruction. Silicone sensitive sequences are not included in  today's study to evaluate for implant integrity. In the region of  palpable concern along the upper-outer implant capsule, note is made  of a probable capsular cyst or implant herniation (a form of  intracapsular rupture) which is a benign finding. No abnormal  enhancement.  Left breast: No abnormal mass or suspicious enhancement.  Lymph nodes: No abnormal appearing lymph nodes.  Ancillary findings: None.  IMPRESSION:  1. Status post right mastectomy without MRI findings of new or  recurrent malignancy.  2. 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 This is a benign  finding. If further evaluation is clinically indicated or if there  is desire for implant replacement, noncontrast breast MR with  implant protocol may be considered.   3. No axillary adenopathy.  RECOMMENDATION:  1. There is a probable intracapsular rupture along the upper-outer  implant capsule the region of palpable concern. If further  evaluation is clinically indicated or if there is desire for implant  replacement, a noncontrast breast MR with implant protocol may be  considered.  2. Recommend annual diagnostic mammography.   MRI report discussed now as above.  Medications: I have reviewed the patient's current medications. OK to continue po iron and B12, tho absorption may not be adequate due to the gastric bypass, but levels all ok now.  DISCUSSION: Patient and mother are relieved that we have not found any evidence of cancer. It seems likely that local discomfort is due to the breast implant, which she wants removed; she does not want any other reconstruction now. I have spoken with general surgery, with recommendation that this be removed by plastic surgery, and have made referral to Dr Theodoro Kos, new patient appointment 02-22-14 @ 0930 ([address], phone [phone], fax [phone]). I have recommended that she see Dr Lysle Rubens again, for consideration of hepatitis testing, with elevated LFTs and evidence of fatty infiltration of liver by CT 01-15-14. We have mentioned possible resumption of tamoxifen after above. I will see her back in 2-3 months, at  which time we can consider the tamoxifen. She will need to continue follow up with medical oncology for the history of breast cancer.  Assessment/Plan: 1.No evidence of recurrent or second primary breast cancer by MRI, but suggestion of implant rupture. It seems likely that present right chest discomfort is related to the implant, which she wants removed. Referral to Dr Migdalia Dk as above. She does not want further reconstruction now. 2.T1N32m M0 right breast cancer diagnosed 06-2009 at age 38 ER PR +, HER 2-, BRCA reportedly negative. Treated with right mastectomy with 3 sentinel nodes, adjuvant  taxotere, carboplatin x 4 thru 09-2009 and ~ 2 years of tamoxifen thru 2013. [facility]. 3.recent weight loss unclear etiology, tho she has gained 2.5 lbs by our scales in past 2 weeks 4.post gastric bypass 2009, at which time she weighed 230 lbs 5.anemia since 2010: B12 and iron normal. Will follow up at next visit. 6.elevated LFTs which may be related to fatty liver, but has not had hepatitis testing (?)   Patient and mother had all questions answered to their satisfaction and are in agreement with plan above. They understand that I will let other physicians know these findings and plan.    LIVESAY,LENNIS P, MD   02/08/2014, 9:30 PM

## 2014-02-11 NOTE — Telephone Encounter (Signed)
Faxed Office note dated 02-08-14 to Dr. Steele Sizer at [phone] attention Elmyra Ricks for appointment 02-22-14 as requested by Dr. Marko Plume.

## 2014-02-18 ENCOUNTER — Encounter: Payer: Self-pay | Admitting: *Deleted

## 2014-02-18 NOTE — Progress Notes (Signed)
[place] Psychosocial Distress Screening Clinical Social Work  Clinical Social Work was referred by distress screening protocol.  The patient scored a 5 on the Psychosocial Distress Thermometer which indicates moderate distress. Clinical Social Worker phoned pt to assess for distress and other psychosocial needs. Pt was not aware of resources available to assist her, such as Support Groups, [employer] and CSWs. CSW discussed resources in detail. Pt felt since her appointment things had improved somewhat. Pt reports to have a good support network and was interested to hear about resources at [facility]. She agrees to phone CSWs as needed.   ONCBCN DISTRESS SCREENING 02/08/2014  Screening Type Initial Screening  Elta [place] the number that describes how much distress you have been experiencing in the past week 5  Practical problem type Work/school  Emotional problem type Nervousness/Anxiety;Adjusting to illness;Adjusting to appearance changes  Spiritual/Religous concerns type Relating to God  Physical Problem type Pain;Sleep/insomnia;Loss of appetitie;Constipation/diarrhea;Tingling hands/feet;Skin dry/itchy;Swollen arms/legs  Physician notified of physical symptoms Yes  Other Distaress screening of level 5 sends a referral to [place] Worker follow up needed: No.  Loren Racer, LCSW Clinical Social Worker [facility] [place] for [facility] Wednesday, Thursday and Friday Phone: [phone] Fax: [phone]

## 2014-02-22 DIAGNOSIS — T8543XA Leakage of breast prosthesis and implant, initial encounter: Secondary | ICD-10-CM | POA: Insufficient documentation

## 2014-03-18 ENCOUNTER — Other Ambulatory Visit: Payer: Self-pay | Admitting: Oncology

## 2014-03-18 DIAGNOSIS — C50911 Malignant neoplasm of unspecified site of right female breast: Secondary | ICD-10-CM

## 2014-03-18 DIAGNOSIS — D649 Anemia, unspecified: Secondary | ICD-10-CM

## 2014-03-19 ENCOUNTER — Telehealth: Payer: Self-pay | Admitting: *Deleted

## 2014-03-19 NOTE — Telephone Encounter (Signed)
Received office visit note and lab from Dr. Glenna Durand office dated 02/14/14. Placed on Dr. Mariana Kaufman desk for review.

## 2014-03-19 NOTE — Telephone Encounter (Signed)
Message copied by Christa See on Tue Mar 19, 2014  9:17 AM ------      Message from: Gordy Levan      Created: Mon Mar 18, 2014  3:41 PM       To see LL on 8-5.      Please call Dr Denton Ar Husain's office and see if they have seen her since my last visit 6-26 - I'd like note and lab results if so.      thanks ------

## 2014-03-20 ENCOUNTER — Encounter: Payer: Self-pay | Admitting: Oncology

## 2014-03-20 ENCOUNTER — Ambulatory Visit (HOSPITAL_BASED_OUTPATIENT_CLINIC_OR_DEPARTMENT_OTHER): Payer: 59 | Admitting: Oncology

## 2014-03-20 ENCOUNTER — Other Ambulatory Visit (HOSPITAL_BASED_OUTPATIENT_CLINIC_OR_DEPARTMENT_OTHER): Payer: 59

## 2014-03-20 VITALS — BP 121/81 | HR 69 | Temp 98.3°F | Resp 18 | Ht 66.0 in | Wt 155.9 lb

## 2014-03-20 DIAGNOSIS — D649 Anemia, unspecified: Secondary | ICD-10-CM

## 2014-03-20 DIAGNOSIS — R634 Abnormal weight loss: Secondary | ICD-10-CM

## 2014-03-20 DIAGNOSIS — Z853 Personal history of malignant neoplasm of breast: Secondary | ICD-10-CM

## 2014-03-20 DIAGNOSIS — C50919 Malignant neoplasm of unspecified site of unspecified female breast: Secondary | ICD-10-CM

## 2014-03-20 DIAGNOSIS — C50911 Malignant neoplasm of unspecified site of right female breast: Secondary | ICD-10-CM

## 2014-03-20 LAB — COMPREHENSIVE METABOLIC PANEL (CC13)
ALBUMIN: 2.7 g/dL — AB (ref 3.5–5.0)
ALK PHOS: 67 U/L (ref 40–150)
ALT: 28 U/L (ref 0–55)
ANION GAP: 8 meq/L (ref 3–11)
AST: 33 U/L (ref 5–34)
BUN: 7 mg/dL (ref 7.0–26.0)
CHLORIDE: 110 meq/L — AB (ref 98–109)
CO2: 27 meq/L (ref 22–29)
Calcium: 8.2 mg/dL — ABNORMAL LOW (ref 8.4–10.4)
Creatinine: 0.7 mg/dL (ref 0.6–1.1)
GLUCOSE: 103 mg/dL (ref 70–140)
POTASSIUM: 4.2 meq/L (ref 3.5–5.1)
SODIUM: 145 meq/L (ref 136–145)
TOTAL PROTEIN: 5.9 g/dL — AB (ref 6.4–8.3)
Total Bilirubin: 0.32 mg/dL (ref 0.20–1.20)

## 2014-03-20 LAB — CBC WITH DIFFERENTIAL/PLATELET
BASO%: 0.6 % (ref 0.0–2.0)
BASOS ABS: 0 10*3/uL (ref 0.0–0.1)
EOS ABS: 0 10*3/uL (ref 0.0–0.5)
EOS%: 0.2 % (ref 0.0–7.0)
HCT: 31.1 % — ABNORMAL LOW (ref 34.8–46.6)
HEMOGLOBIN: 9.8 g/dL — AB (ref 11.6–15.9)
LYMPH#: 1.6 10*3/uL (ref 0.9–3.3)
LYMPH%: 38.3 % (ref 14.0–49.7)
MCH: 25.6 pg (ref 25.1–34.0)
MCHC: 31.4 g/dL — ABNORMAL LOW (ref 31.5–36.0)
MCV: 81.5 fL (ref 79.5–101.0)
MONO#: 0.3 10*3/uL (ref 0.1–0.9)
MONO%: 7.9 % (ref 0.0–14.0)
NEUT#: 2.2 10*3/uL (ref 1.5–6.5)
NEUT%: 53 % (ref 38.4–76.8)
Platelets: 270 10*3/uL (ref 145–400)
RBC: 3.81 10*6/uL (ref 3.70–5.45)
RDW: 15.7 % — ABNORMAL HIGH (ref 11.2–14.5)
WBC: 4.2 10*3/uL (ref 3.9–10.3)

## 2014-03-20 NOTE — Progress Notes (Signed)
OFFICE PROGRESS NOTE   03/20/2014   Physicians:K.Turner Daniels Sanger, Rolm Bookbinder   INTERVAL HISTORY:  Patient is seen, alone for visit, in follow up of history of right breast cancer, with ongoing problems related to right breast implant. She had consultation with Dr Migdalia Dk, who agrees with removing the right implant, and is to see Dr Donne Hazel on 03-21-14 as she is interested in left mastectomy at time of right implant removal. She would like to resume tamoxifen, this stopped by patient ~ 2 years ago due to lack of insurance then.  She also saw Dr Lysle Rubens in 02-2014, with notation that hepatitis screen and HIV are negative; he feels LFT changes are related to fatty liver. Patient continues to experience frequent bowel movements, as often as 5x daily for past ~ 4 months, generally immediately after she eats, tho not watery stools. She has had 3 bowel movements by time of early afternoon visit here today. Appetite at times is good, including good po intake for 3 meals yesterday, but usually 1-2 days weekly she has no appetite and has to force herself to eat broth. She has lost 3 lbs since I saw her 02-08-14, BMI 25.2. Note TSH normal by Dr Lysle Rubens 01-10-2014. She denies overt bleeding or increased symptoms from anemia.   ONCOLOGIC HISTORY Patient found mass in right breast adjacent to nipple in 04-2009 at age 80. This was 1.8 cm moderately differentiated invasive ductal carcinoma with micrometastais<37m in 1/3 sentinel nodes, associated high grade DCIS, surgical margins widely negative, ER+ PR +, HER 2 negative by FISH, angiolymphatic invasion present at right mastectomy with 3 sentinel nodes ([phone]-2009, [facility]. Path staging was [id number])pMx. [place] JaTor Nettersradiation oncologist Essam ShLucy ChrisShe had adjuvant taxotere  cytoxan x 4 cycles from Dec 2010 thru 09-26-2009, then tamoxifen from 2011 until "2 years ago", so ~ 2013 when she stopped this due to lack of insurance. She recalls that she tolerated tamoxifen without difficulty, apparently was on Effexor for hot flashes then. She also may have been on zometa q 6 months per outside records, but does not recall this. She tells me that she was BRCA 1 and 2 negative (not in records received). Oncotype Dx from 07-2009 had breast cancer recurrence score of 13.  She had implant on right, with significant discomfort there requiring total of [place] had continuous discomfort described as clawing or pulling under the implant for at least 2 years. In the few notes available from previous physicians, she used percocet for this discomfort. MRI breast bilateral 01-25-14 showed probable intracapsular rupture of right silicone implant and no evidence of malignancy or adenopathy bilaterally.    Review of systems as above, also: No GERD or epigastric pain. Bladder ok. No swelling LE. No SOB, cough, chest pain. Remainder of 10 point Review of Systems negative.  Objective:  Vital signs in last 24 hours:  BP 121/81  Pulse 69  Temp(Src) 98.3 F (36.8 C) (Oral)  Resp 18  Ht 5' 6"  (1.676 m)  Wt 155 lb 14.4 oz (70.716 kg)  BMI 25.18 kg/m2 weight is down 3 lbs.  Alert, oriented and appropriate. Easily ambulatory, in no apparent distress.   HEENT:PERRL, sclerae not icteric. Oral mucosa moist without lesions, posterior pharynx clear.  Neck supple. No JVD.  Lymphatics:no cervical,suraclavicular, axillaryl adenopathy Resp: clear to auscultation bilaterally and normal  percussion bilaterally Cardio: regular rate and rhythm. No gallop. GI: soft, nontender including epigastrium, not distended, no mass or organomegaly. Normally active bowel sounds.  Musculoskeletal/ Extremities: without pitting edema, cords, tenderness Neuro: nonfocal. PSYCH  normal mood and affect Skin without rash, ecchymosis, petechiae Right reconstructed breast with soft asymmetry laterally as previously, related to implant, otherwise no skin findings of concern and no evidence of local recurrence. Left breast without dominant mass, skin or nipple findings. Axillae benign.   Lab Results:  Results for orders placed in visit on 03/20/14  CBC WITH DIFFERENTIAL      Result Value Ref Range   WBC 4.2  3.9 - 10.3 10e3/uL   NEUT# 2.2  1.5 - 6.5 10e3/uL   HGB 9.8 (*) 11.6 - 15.9 g/dL   HCT 31.1 (*) 34.8 - 46.6 %   Platelets 270  145 - 400 10e3/uL   MCV 81.5  79.5 - 101.0 fL   MCH 25.6  25.1 - 34.0 pg   MCHC 31.4 (*) 31.5 - 36.0 g/dL   RBC 3.81  3.70 - 5.45 10e6/uL   RDW 15.7 (*) 11.2 - 14.5 %   lymph# 1.6  0.9 - 3.3 10e3/uL   MONO# 0.3  0.1 - 0.9 10e3/uL   Eosinophils Absolute 0.0  0.0 - 0.5 10e3/uL   Basophils Absolute 0.0  0.0 - 0.1 10e3/uL   NEUT% 53.0  38.4 - 76.8 %   LYMPH% 38.3  14.0 - 49.7 %   MONO% 7.9  0.0 - 14.0 %   EOS% 0.2  0.0 - 7.0 %   BASO% 0.6  0.0 - 2.0 %  COMPREHENSIVE METABOLIC PANEL (DP82)      Result Value Ref Range   Sodium 145  136 - 145 mEq/L   Potassium 4.2  3.5 - 5.1 mEq/L   Chloride 110 (*) 98 - 109 mEq/L   CO2 27  22 - 29 mEq/L   Glucose 103  70 - 140 mg/dl   BUN 7.0  7.0 - 26.0 mg/dL   Creatinine 0.7  0.6 - 1.1 mg/dL   Total Bilirubin 0.32  0.20 - 1.20 mg/dL   Alkaline Phosphatase 67  40 - 150 U/L   AST 33  5 - 34 U/L   ALT 28  0 - 55 U/L   Total Protein 5.9 (*) 6.4 - 8.3 g/dL   Albumin 2.7 (*) 3.5 - 5.0 g/dL   Calcium 8.2 (*) 8.4 - 10.4 mg/dL   Anion Gap 8  3 - 11 mEq/L    Hemoglobin electrophoresis pending Hemoccult cards x 3 pending.  Studies/Results:  No results found.  Medications: I have reviewed the patient's current medications. Patient states she has taken fish oil x 2 years, with GI symptoms only in last 4 months, however I have asked her to stop fish oil to see if that helps symptoms.  DISCUSSION:  Patient will keep apt with Dr Donne Hazel on 03-21-14. She will call this office in a week to let me know if GI symptoms have improved off of fish oil; if not, she needs referral to [facility] GI.   Assessment/Plan: 1.T1N9m M0 right breast cancer diagnosed 06-2009 at age 38 ER PR +, HER 2-, BRCA reportedly negative. Treated with right mastectomy with 3 sentinel nodes, adjuvant taxotere, carboplatin x 4 thru 09-2009 and ~ 2 years of tamoxifen thru 2013. [facility] Will resume tamoxifen in near future. 2.chronic discomfort right breast since silicone implant, which appears  to have some leak by MRI. Dr Migdalia Dk plans to remove implant without further reconstruction, possibly to coordinate this with left mastectomy. 3.recent weight loss unclear etiology tho frequent bowel movements and intermittent anorexia. Trial off fish oil, but if not resolved suggest referral to GI. HIV negative per Dr Lysle Rubens. 4.post gastric bypass 2009, at which time she weighed 230 lbs  5.anemia since 2010: B12 and iron normal. Hgb lower today. Hemoglobin electrophoresis pending, hemoccults pending (tho not iron deficient by last labs). Has not had bone marrow exam. Not more symptomatic. 6.previously elevated LFTs which may be related to fatty liver, hepatitis negative per Dr Lysle Rubens. LFTs improved today.  Will follow up GI symptoms off fish oil, results of hgb electrophoresis and HCs pending, and schedule for right breast implant removal/ possible left mastectomy. Will decide when to resume tamoxifen and my next visit pending above.   Williamson Cavanah P, MD   03/20/2014, 2:16 PM

## 2014-03-21 ENCOUNTER — Ambulatory Visit (INDEPENDENT_AMBULATORY_CARE_PROVIDER_SITE_OTHER): Payer: 59 | Admitting: General Surgery

## 2014-03-21 ENCOUNTER — Other Ambulatory Visit (INDEPENDENT_AMBULATORY_CARE_PROVIDER_SITE_OTHER): Payer: Self-pay | Admitting: General Surgery

## 2014-03-21 ENCOUNTER — Encounter (INDEPENDENT_AMBULATORY_CARE_PROVIDER_SITE_OTHER): Payer: Self-pay | Admitting: General Surgery

## 2014-03-21 VITALS — BP 120/80 | HR 68 | Temp 97.8°F | Ht 65.0 in | Wt 152.0 lb

## 2014-03-21 DIAGNOSIS — C50919 Malignant neoplasm of unspecified site of unspecified female breast: Secondary | ICD-10-CM

## 2014-03-21 DIAGNOSIS — C50911 Malignant neoplasm of unspecified site of right female breast: Secondary | ICD-10-CM

## 2014-03-21 NOTE — Progress Notes (Addendum)
Patient ID: Cheryl Holloway, female   DOB: 06-30-1976, 38 y.o.   MRN: [id number]  Chief Complaint  Patient presents with  . Other    HPI Cheryl Holloway is a 38 y.o. female.  Referred by Dr Theodoro Kos HPI This is a 38 year old female who presents as a new consult for possible left prophylactic mastectomy. Her history involves a right breast cancer at age 66. She had no family history. She states that she was tested for BRCA1 and 2 and this was negative. She underwent a right mastectomy and sentinel lymph node biopsy for a T1 N36micM0 cancer. This was estrogen and progesterone receptor positive and HER-2/neu was not amplified. She then had adjuvant chemotherapy. She had tamoxifen until 2011 and she stopped this due to her insurance. She did tolerate this before. Her oncotype previously was 13. She did not receive radiation. She is at a total of 4 surgeries on the right breast due to issues with an implant. She has significant discomfort there and felt like she had a mass there now. She very much wants this implant removed. She is on since undergone an MRI which shows probable rupture of the implant. She has been seen by Dr. Migdalia Dk who is planning on removing the implant. She does not want any more construction. The patient desires to have a left mastectomy also.  Past Medical History  Diagnosis Date  . Breast cancer     Past Surgical History  Procedure Laterality Date  . Mastectomy    . Breast reconstruction    . Breast reduction surgery    . Gastric bypass      History reviewed. No pertinent family history.  Social History History  Substance Use Topics  . Smoking status: Never Smoker   . Smokeless tobacco: Not on file  . Alcohol Use: Yes    No Known Allergies  Current Outpatient Prescriptions  Medication Sig Dispense Refill  . fish oil-omega-3 fatty acids 1000 MG capsule Take 1 g by mouth daily.      . Ibuprofen-Diphenhydramine HCl (ADVIL PM) 200-25 MG CAPS Take 1 capsule by  mouth at bedtime as needed.      . IRON PO Take 1 tablet by mouth daily.      . Multiple Vitamin (MULTIVITAMIN WITH MINERALS) TABS Take 1 tablet by mouth daily.      Marland Kitchen OVER THE COUNTER MEDICATION 1 tablet daily of  Potassium      . vitamin B-12 (CYANOCOBALAMIN) 500 MCG tablet Take 500 mcg by mouth daily.      Marland Kitchen VITAMIN E PO Take 400 Units by mouth daily.        No current facility-administered medications for this visit.    Review of Systems Review of Systems  Constitutional: Negative for fever, chills and unexpected weight change.  HENT: Negative for congestion, hearing loss, sore throat, trouble swallowing and voice change.   Eyes: Negative for visual disturbance.  Respiratory: Negative for cough and wheezing.   Cardiovascular: Negative for chest pain, palpitations and leg swelling.  Gastrointestinal: Negative for nausea, vomiting, abdominal pain, diarrhea, constipation, blood in stool, abdominal distention and anal bleeding.  Genitourinary: Negative for hematuria, vaginal bleeding and difficulty urinating.  Musculoskeletal: Negative for arthralgias.  Skin: Negative for rash and wound.  Neurological: Negative for seizures, syncope and headaches.  Hematological: Negative for adenopathy. Does not bruise/bleed easily.  Psychiatric/Behavioral: Negative for confusion.    Blood pressure 120/80, pulse 68, temperature 97.8 F (36.6 C), height $RemoveBe'5\' 5"'MGQnhBROK$  (1.651 m), weight  152 lb (68.947 kg).  Physical Exam Physical Exam  Vitals reviewed. Constitutional: She appears well-developed and well-nourished.  Neck: Neck supple.  Cardiovascular: Normal rate, regular rhythm and normal heart sounds.   Pulmonary/Chest: Effort normal and breath sounds normal. She has no wheezes. She has no rales. Right breast exhibits tenderness. Right breast exhibits no mass. Left breast exhibits no inverted nipple, no mass, no nipple discharge, no skin change and no tenderness. Breasts are asymmetrical (right sided  implant in place).    Lymphadenopathy:    She has no cervical adenopathy.    She has no axillary adenopathy.       Right: No supraclavicular adenopathy present.       Left: No supraclavicular adenopathy present.    Data Reviewed EXAM:  BILATERAL BREAST MRI WITH AND WITHOUT CONTRAST  TECHNIQUE:  Multiplanar, multisequence MR images of both breasts were obtained  prior to and following the intravenous administration of 58ml of  MultiHance.  THREE-DIMENSIONAL MR IMAGE RENDERING ON INDEPENDENT WORKSTATION:  Three-dimensional MR images were rendered by post-processing of the  original MR data on an independent workstation. The  three-dimensional MR images were interpreted, and findings are  reported in the following complete MRI report for this study. Three  dimensional images were evaluated at the independent DynaCad  workstation  COMPARISON: Previous exams  FINDINGS:  Breast composition: Patient is status post right mastectomy with  implant reconstruction. The left breast demonstrates heterogeneous  fibroglandular tissue.  Background parenchymal enhancement: There is a moderate degree of  background parenchymal enhancement with scattered foci of non mass  enhancement with benign MR features in the left breast.  Right breast: Status post right mastectomy with silicone implant  reconstruction. Silicone sensitive sequences are not included in  today's study to evaluate for implant integrity. In the region of  palpable concern along the upper-outer implant capsule, note is made  of a probable capsular cyst or implant herniation (a form of  intracapsular rupture) which is a benign finding. No abnormal  enhancement.  Left breast: No abnormal mass or suspicious enhancement.  Lymph nodes: No abnormal appearing lymph nodes.  Ancillary findings: None.  IMPRESSION:  1. Status post right mastectomy without MRI findings of new or  recurrent malignancy.  2. 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 This is a benign  finding. If further evaluation is clinically indicated or if there  is desire for implant replacement, noncontrast breast MR with  implant protocol may be considered.  3. No axillary adenopathy.  RECOMMENDATION:  1. There is a probable intracapsular rupture along the upper-outer  implant capsule the region of palpable concern. If further  evaluation is clinically indicated or if there is desire for implant  replacement, a noncontrast breast MR with implant protocol may be  considered.  2. Recommend annual diagnostic mammography.  BI-RADS CATEGORY 2: Benign.   Assessment    [id number] right breast cancer Ruptured implant     Plan    Left prophylactic mastectomy  On discussion with her today due to her fact that her genetic testing is negative there is non-oncologic indication to do a left mastectomy. I do not think this will increase her survival. I also told her this will not prevent her from developing breast cancer on the left side there is up to a 5% chance that she could still have breast cancer after a mastectomy. However she is going to have the right implant removed  and she has decided that she would like to have symmetry. I think it is reasonable given her age to consider this. We discussed a left total mastectomy with removal of the nipple areolar complex. Again she does not desire reconstruction. We discussed the usage of drains as well as some of the issues postoperatively. The risks include but are not limited to bleeding, infection, reoperation for hematoma, this may not be completely flat and may have some wrinkles and some excess skin. Were going to check with her insurance and then I will try to schedule this in combination with Dr. Migdalia Dk.    I dont think sentinel node is indicated given normal exam and mri.  Risk of lymphedema from surgery is more than risk of having an invasive  cancer.    Rana Adorno 03/21/2014, 10:17 AM

## 2014-03-22 LAB — HEMOGLOBINOPATHY EVALUATION
HEMOGLOBIN OTHER: 0 %
Hgb A2 Quant: 3.2 % (ref 2.2–3.2)
Hgb A: 95.9 % — ABNORMAL LOW (ref 96.8–97.8)
Hgb F Quant: 0.9 % (ref 0.0–2.0)
Hgb S Quant: 0 %

## 2014-03-26 ENCOUNTER — Other Ambulatory Visit: Payer: Self-pay

## 2014-03-26 DIAGNOSIS — D649 Anemia, unspecified: Secondary | ICD-10-CM

## 2014-03-26 DIAGNOSIS — C50911 Malignant neoplasm of unspecified site of right female breast: Secondary | ICD-10-CM

## 2014-03-28 ENCOUNTER — Other Ambulatory Visit: Payer: Self-pay | Admitting: Oncology

## 2014-03-28 ENCOUNTER — Telehealth: Payer: Self-pay | Admitting: *Deleted

## 2014-03-28 ENCOUNTER — Ambulatory Visit (HOSPITAL_BASED_OUTPATIENT_CLINIC_OR_DEPARTMENT_OTHER): Payer: 59

## 2014-03-28 DIAGNOSIS — D649 Anemia, unspecified: Secondary | ICD-10-CM

## 2014-03-28 DIAGNOSIS — C50911 Malignant neoplasm of unspecified site of right female breast: Secondary | ICD-10-CM

## 2014-03-28 LAB — FECAL OCCULT BLOOD, GUAIAC: Occult Blood: POSITIVE

## 2014-03-28 NOTE — Telephone Encounter (Signed)
Patient called and left VM that she dropped off stool sample at the lab and wanted to make sure they got it. Call patient back and let her know that Dr. Marko Plume is out of the office today and we will followup with her regarding results tomorrow or early next week. Patient appreciate of call.

## 2014-03-29 ENCOUNTER — Other Ambulatory Visit: Payer: Self-pay | Admitting: Oncology

## 2014-03-29 DIAGNOSIS — Z9884 Bariatric surgery status: Secondary | ICD-10-CM

## 2014-03-29 DIAGNOSIS — D649 Anemia, unspecified: Secondary | ICD-10-CM

## 2014-03-29 NOTE — Progress Notes (Signed)
Medical Oncology  Hemoccult cards returned, one positive for occult blood. RN to be in touch with patient to let her know that I am making referral to [facility]. Also need to know how diarrhea is off of fish oil, and if surgery is scheduled. POF entered for GI referral and for return apt to myself in Sept with lab.  Godfrey Pick, MD

## 2014-04-01 ENCOUNTER — Telehealth: Payer: Self-pay

## 2014-04-01 ENCOUNTER — Other Ambulatory Visit: Payer: Self-pay | Admitting: Oncology

## 2014-04-01 NOTE — Telephone Encounter (Signed)
Told Ms. Plater the information as noted below by Dr. Marko Plume.  Told her that her Hgb electrophoresis lab results were normalTold Ms. Plater to call Dr. Mariana Kaufman nurse next week if she has not received an appointment with [facility]. Ms. Gershon Mussel states that the diarrhea is better off fish ooil.  She is having diarrhea 4-6 times a week instead of 5-6 times a day.  She will remain off the fish oil . Her breast surgery is 04-25-14 with Dr. Migdalia Dk and Donne Hazel.

## 2014-04-01 NOTE — Telephone Encounter (Signed)
Message copied by Baruch Merl on Mon Apr 01, 2014  2:53 PM ------      Message from: Gordy Levan      Created: Fri Mar 29, 2014  5:50 PM       Labs seen and need follow up: please let her know that one of the stool cards does have some blood. I would like her to see GI and have put in referral to [facility] since PCP is with [facility]. Please ask how diarrhea is off fish oil, and if breast surgery is scheduled. ------

## 2014-04-02 ENCOUNTER — Telehealth: Payer: Self-pay | Admitting: Dietician

## 2014-04-02 NOTE — Telephone Encounter (Signed)
Brief Outpatient Oncology Nutrition Note  Patient has been identified to be at risk on malnutrition screen.  Wt Readings from Last 10 Encounters:  03/21/14 152 lb (68.947 kg)  03/20/14 155 lb 14.4 oz (70.716 kg)  02/08/14 159 lb (72.122 kg)  01/25/14 156 lb 11.2 oz (71.079 kg)  08/20/13 177 lb (80.287 kg)      Called patient due to weight loss.  Patient with hx or right breast cancer at age 38.  Patient of Dr. Marko Plume.  Patient for elective mastectomy of left breast and removal of implant on right in September.  Patient was not available.  Message left with contact information for the [facility].  Antonieta Iba, RD, LDN

## 2014-04-03 ENCOUNTER — Telehealth: Payer: Self-pay | Admitting: Oncology

## 2014-04-03 NOTE — Telephone Encounter (Signed)
, °

## 2014-04-10 ENCOUNTER — Encounter (INDEPENDENT_AMBULATORY_CARE_PROVIDER_SITE_OTHER): Payer: Self-pay

## 2014-04-17 ENCOUNTER — Encounter (HOSPITAL_BASED_OUTPATIENT_CLINIC_OR_DEPARTMENT_OTHER): Payer: Self-pay | Admitting: *Deleted

## 2014-04-23 ENCOUNTER — Other Ambulatory Visit: Payer: Self-pay | Admitting: Plastic Surgery

## 2014-04-23 NOTE — H&P (Signed)
Cheryl Holloway is an 38 y.o. female.   Chief Complaint: Left breast cancer HPI:The patient is a 37 yrs old bf here for a history and physical for removal of a ruptured right silicone breast implant. 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 She was treated with a right mastectomy with 3 sentinel nodes. She received chemotherapy followed by tamoxifen and no radiation. She underwent a right breast reconstruction with silicone implant placement and NAC recontruction. She is not sure about the size. She had a left breast reduction/mastopexy. She complains of tightness, clawing and pain in the right breast area. She had 3 surgeries to try to help the issue but still has the pain. At this point she wants to have a left mastecomy and removal of the right implant without reconstruction. She appears to have some pectoralis coverage of the implant. There is some distortion and hard areas noted at the right breast. She does not want any implants and understands she may be losing her NAP as well.  Past Medical History  Diagnosis Date  . Breast cancer   . Anemia     takes iron    Past Surgical History  Procedure Laterality Date  . Breast reconstruction    . Breast reduction surgery    . Gastric bypass    . Tubal ligation    . Cesarean section      x3  . Mastectomy      right    No family history on file. Social History:  reports that she has never smoked. She does not have any smokeless tobacco history on file. She reports that she drinks alcohol. She reports that she does not use illicit drugs.  Allergies: No Known Allergies   (Not in a hospital admission)  No results found for this or any previous visit (from the past 48 hour(s)). No results found.  Review of Systems  Constitutional: Negative.   HENT: Negative.   Eyes: Negative.   Respiratory: Negative.   Cardiovascular: Negative.    Gastrointestinal: Negative.   Genitourinary: Negative.   Musculoskeletal: Negative.   Skin: Negative.     Last menstrual period 03/27/2014. Physical Exam  Constitutional: She is oriented to person, place, and time. She appears well-developed and well-nourished.  HENT:  Head: Normocephalic and atraumatic.  Eyes: Conjunctivae and EOM are normal. Pupils are equal, round, and reactive to light.  Cardiovascular: Normal rate.   Respiratory: Effort normal.  Musculoskeletal: Normal range of motion.  Neurological: She is alert and oriented to person, place, and time.  Skin: Skin is warm.  Psychiatric: She has a normal mood and affect. Her behavior is normal. Judgment and thought content normal.     Assessment/Plan Removal of right breast implant and capsulectomy (No implant being placed).  [place] 04/23/2014, 11:40 AM

## 2014-04-24 ENCOUNTER — Encounter (HOSPITAL_BASED_OUTPATIENT_CLINIC_OR_DEPARTMENT_OTHER)
Admission: RE | Admit: 2014-04-24 | Discharge: 2014-04-24 | Disposition: A | Discharge status: Home / Self Care | Payer: 59 | Source: Ambulatory Visit | Attending: General Surgery | Admitting: General Surgery

## 2014-04-24 LAB — BASIC METABOLIC PANEL
Anion gap: 9 (ref 5–15)
BUN: 7 mg/dL (ref 6–23)
CHLORIDE: 107 meq/L (ref 96–112)
CO2: 24 meq/L (ref 19–32)
CREATININE: 0.73 mg/dL (ref 0.50–1.10)
Calcium: 8.3 mg/dL — ABNORMAL LOW (ref 8.4–10.5)
GFR calc Af Amer: >90 mL/min (ref >90)
GFR calc non Af Amer: >90 mL/min (ref >90)
GLUCOSE: 119 mg/dL — AB (ref 70–99)
Potassium: 4.4 mEq/L (ref 3.7–5.3)
Sodium: 140 mEq/L (ref 137–147)

## 2014-04-24 LAB — CBC WITH DIFFERENTIAL/PLATELET
BASOS PCT: 1 % (ref 0–1)
Basophils Absolute: 0 10*3/uL (ref 0.0–0.1)
EOS ABS: 0 10*3/uL (ref 0.0–0.7)
Eosinophils Relative: 0 % (ref 0–5)
HEMATOCRIT: 27.7 % — AB (ref 36.0–46.0)
Hemoglobin: 9 g/dL — ABNORMAL LOW (ref 12.0–15.0)
Lymphocytes Relative: 34 % (ref 12–46)
Lymphs Abs: 1.3 10*3/uL (ref 0.7–4.0)
MCH: 25.4 pg — AB (ref 26.0–34.0)
MCHC: 32.5 g/dL (ref 30.0–36.0)
MCV: 78 fL (ref 78.0–100.0)
MONO ABS: 0.4 10*3/uL (ref 0.1–1.0)
MONOS PCT: 9 % (ref 3–12)
Neutro Abs: 2.2 10*3/uL (ref 1.7–7.7)
Neutrophils Relative %: 56 % (ref 43–77)
Platelets: 228 10*3/uL (ref 150–400)
RBC: 3.55 MIL/uL — ABNORMAL LOW (ref 3.87–5.11)
RDW: 15.5 % (ref 11.5–15.5)
WBC: 3.9 10*3/uL — ABNORMAL LOW (ref 4.0–10.5)

## 2014-04-25 ENCOUNTER — Ambulatory Visit (HOSPITAL_BASED_OUTPATIENT_CLINIC_OR_DEPARTMENT_OTHER): Payer: 59 | Admitting: Anesthesiology

## 2014-04-25 ENCOUNTER — Encounter (HOSPITAL_COMMUNITY)
Admission: RE | Disposition: A | Discharge status: Home / Self Care | Payer: Self-pay | Source: Ambulatory Visit | Attending: General Surgery

## 2014-04-25 ENCOUNTER — Encounter (HOSPITAL_BASED_OUTPATIENT_CLINIC_OR_DEPARTMENT_OTHER): Payer: 59 | Admitting: Anesthesiology

## 2014-04-25 ENCOUNTER — Encounter (HOSPITAL_BASED_OUTPATIENT_CLINIC_OR_DEPARTMENT_OTHER): Payer: Self-pay | Admitting: *Deleted

## 2014-04-25 ENCOUNTER — Inpatient Hospital Stay (HOSPITAL_BASED_OUTPATIENT_CLINIC_OR_DEPARTMENT_OTHER)
Admission: RE | Admit: 2014-04-25 | Discharge: 2014-04-29 | DRG: 584 | Disposition: A | Discharge status: Home / Self Care | Payer: 59 | Source: Ambulatory Visit | Attending: General Surgery | Admitting: General Surgery

## 2014-04-25 DIAGNOSIS — Z9010 Acquired absence of unspecified breast and nipple: Secondary | ICD-10-CM

## 2014-04-25 DIAGNOSIS — Z4001 Encounter for prophylactic removal of breast: Secondary | ICD-10-CM | POA: Diagnosis not present

## 2014-04-25 DIAGNOSIS — Z853 Personal history of malignant neoplasm of breast: Secondary | ICD-10-CM

## 2014-04-25 DIAGNOSIS — D62 Acute posthemorrhagic anemia: Secondary | ICD-10-CM | POA: Diagnosis not present

## 2014-04-25 DIAGNOSIS — Z9221 Personal history of antineoplastic chemotherapy: Secondary | ICD-10-CM | POA: Diagnosis not present

## 2014-04-25 DIAGNOSIS — T8549XA Other mechanical complication of breast prosthesis and implant, initial encounter: Secondary | ICD-10-CM | POA: Diagnosis present

## 2014-04-25 DIAGNOSIS — Z9884 Bariatric surgery status: Secondary | ICD-10-CM | POA: Diagnosis not present

## 2014-04-25 DIAGNOSIS — Y831 Surgical operation with implant of artificial internal device as the cause of abnormal reaction of the patient, or of later complication, without mention of misadventure at the time of the procedure: Secondary | ICD-10-CM | POA: Diagnosis present

## 2014-04-25 DIAGNOSIS — Z01812 Encounter for preprocedural laboratory examination: Secondary | ICD-10-CM

## 2014-04-25 HISTORY — DX: Anemia, unspecified: D64.9

## 2014-04-25 HISTORY — PX: BREAST IMPLANT REMOVAL: SHX5361

## 2014-04-25 HISTORY — PX: SIMPLE MASTECTOMY WITH AXILLARY SENTINEL NODE BIOPSY: SHX6098

## 2014-04-25 LAB — CBC
HCT: 20.5 % — ABNORMAL LOW (ref 36.0–46.0)
HEMOGLOBIN: 6.6 g/dL — AB (ref 12.0–15.0)
MCH: 25.1 pg — ABNORMAL LOW (ref 26.0–34.0)
MCHC: 32.2 g/dL (ref 30.0–36.0)
MCV: 77.9 fL — AB (ref 78.0–100.0)
PLATELETS: 166 10*3/uL (ref 150–400)
RBC: 2.63 MIL/uL — AB (ref 3.87–5.11)
RDW: 15.6 % — ABNORMAL HIGH (ref 11.5–15.5)
WBC: 9.5 10*3/uL (ref 4.0–10.5)

## 2014-04-25 LAB — PREPARE RBC (CROSSMATCH)

## 2014-04-25 LAB — MRSA PCR SCREENING: MRSA by PCR: NEGATIVE

## 2014-04-25 LAB — POCT HEMOGLOBIN-HEMACUE: Hemoglobin: 6.4 g/dL — CL (ref 12.0–15.0)

## 2014-04-25 LAB — ABO/RH: ABO/RH(D): A POS

## 2014-04-25 SURGERY — SIMPLE MASTECTOMY
Anesthesia: General | Laterality: Right

## 2014-04-25 MED ORDER — FENTANYL CITRATE 0.05 MG/ML IJ SOLN
INTRAMUSCULAR | Status: AC
  Filled 2014-04-25: qty 2

## 2014-04-25 MED ORDER — SODIUM CHLORIDE 0.9 % IR SOLN
Status: DC | PRN
  Administered 2014-04-25: 09:00:00

## 2014-04-25 MED ORDER — CEFAZOLIN SODIUM-DEXTROSE 2-3 GM-% IV SOLR
INTRAVENOUS | Status: AC
  Filled 2014-04-25: qty 50

## 2014-04-25 MED ORDER — SODIUM CHLORIDE 0.9 % IV SOLN
INTRAVENOUS | Status: DC
  Administered 2014-04-25: 100 mL/h via INTRAVENOUS
  Administered 2014-04-26 (×2): via INTRAVENOUS

## 2014-04-25 MED ORDER — MIDAZOLAM HCL 5 MG/5ML IJ SOLN
INTRAMUSCULAR | Status: DC | PRN
  Administered 2014-04-25: 2 mg via INTRAVENOUS

## 2014-04-25 MED ORDER — SUCCINYLCHOLINE CHLORIDE 20 MG/ML IJ SOLN
INTRAMUSCULAR | Status: DC | PRN
  Administered 2014-04-25: 100 mg via INTRAVENOUS

## 2014-04-25 MED ORDER — PROPOFOL 10 MG/ML IV BOLUS
INTRAVENOUS | Status: DC | PRN
  Administered 2014-04-25: 20 mg via INTRAVENOUS
  Administered 2014-04-25: 180 mg via INTRAVENOUS

## 2014-04-25 MED ORDER — BUPIVACAINE-EPINEPHRINE 0.25% -1:200000 IJ SOLN
INTRAMUSCULAR | Status: DC | PRN
  Administered 2014-04-25: 5 mL

## 2014-04-25 MED ORDER — ONDANSETRON HCL 4 MG/2ML IJ SOLN
4.0000 mg | Freq: Four times a day (QID) | INTRAMUSCULAR | Status: DC | PRN
  Administered 2014-04-25 – 2014-04-27 (×4): 4 mg via INTRAVENOUS
  Filled 2014-04-25 (×4): qty 2

## 2014-04-25 MED ORDER — LIDOCAINE HCL (CARDIAC) 20 MG/ML IV SOLN
INTRAVENOUS | Status: DC | PRN
  Administered 2014-04-25: 40 mg via INTRAVENOUS
  Administered 2014-04-25: 60 mg via INTRAVENOUS

## 2014-04-25 MED ORDER — HYDROMORPHONE HCL PF 1 MG/ML IJ SOLN
INTRAMUSCULAR | Status: AC
  Filled 2014-04-25: qty 1

## 2014-04-25 MED ORDER — CEFAZOLIN SODIUM-DEXTROSE 2-3 GM-% IV SOLR
2.0000 g | Freq: Three times a day (TID) | INTRAVENOUS | Status: AC
  Administered 2014-04-25 – 2014-04-26 (×3): 2 g via INTRAVENOUS
  Filled 2014-04-25 (×4): qty 50

## 2014-04-25 MED ORDER — BUPIVACAINE-EPINEPHRINE (PF) 0.25% -1:200000 IJ SOLN
INTRAMUSCULAR | Status: AC
  Filled 2014-04-25: qty 30

## 2014-04-25 MED ORDER — FENTANYL CITRATE 0.05 MG/ML IJ SOLN
50.0000 ug | INTRAMUSCULAR | Status: DC | PRN
Start: 2014-04-25 — End: 2014-04-25
  Administered 2014-04-25: 100 ug via INTRAVENOUS

## 2014-04-25 MED ORDER — LACTATED RINGERS IV SOLN
INTRAVENOUS | Status: DC
  Administered 2014-04-25: 12:00:00 via INTRAVENOUS

## 2014-04-25 MED ORDER — ACETAMINOPHEN 10 MG/ML IV SOLN
INTRAVENOUS | Status: AC
  Filled 2014-04-25: qty 100

## 2014-04-25 MED ORDER — LABETALOL HCL 5 MG/ML IV SOLN
INTRAVENOUS | Status: DC | PRN
  Administered 2014-04-25: 5 mg via INTRAVENOUS
  Administered 2014-04-25: 10 mg via INTRAVENOUS
  Administered 2014-04-25: 5 mg via INTRAVENOUS

## 2014-04-25 MED ORDER — MIDAZOLAM HCL 2 MG/2ML IJ SOLN
INTRAMUSCULAR | Status: AC
Start: 2014-04-25 — End: 2014-04-25
  Filled 2014-04-25: qty 2

## 2014-04-25 MED ORDER — OXYCODONE HCL 5 MG/5ML PO SOLN
5.0000 mg | Freq: Once | ORAL | Status: AC | PRN
Start: 2014-04-25 — End: 2014-04-25

## 2014-04-25 MED ORDER — ONDANSETRON HCL 4 MG/2ML IJ SOLN: 4.0000 mg | Freq: Once | INTRAMUSCULAR | Status: DC | PRN

## 2014-04-25 MED ORDER — DEXAMETHASONE SODIUM PHOSPHATE 4 MG/ML IJ SOLN: INTRAMUSCULAR | Status: DC | PRN

## 2014-04-25 MED ORDER — DEXAMETHASONE SODIUM PHOSPHATE 4 MG/ML IJ SOLN
INTRAMUSCULAR | Status: DC | PRN
  Administered 2014-04-25: 10 mg via INTRAVENOUS

## 2014-04-25 MED ORDER — OXYCODONE HCL 5 MG PO TABS
5.0000 mg | ORAL_TABLET | Freq: Once | ORAL | Status: AC | PRN
  Administered 2014-04-25: 5 mg via ORAL

## 2014-04-25 MED ORDER — FENTANYL CITRATE 0.05 MG/ML IJ SOLN
INTRAMUSCULAR | Status: DC | PRN
  Administered 2014-04-25: 50 ug via INTRAVENOUS
  Administered 2014-04-25: 25 ug via INTRAVENOUS
  Administered 2014-04-25 (×2): 50 ug via INTRAVENOUS
  Administered 2014-04-25: 25 ug via INTRAVENOUS
  Administered 2014-04-25 (×4): 50 ug via INTRAVENOUS

## 2014-04-25 MED ORDER — MIDAZOLAM HCL 2 MG/2ML IJ SOLN
INTRAMUSCULAR | Status: AC
  Filled 2014-04-25: qty 2

## 2014-04-25 MED ORDER — METOCLOPRAMIDE HCL 5 MG/ML IJ SOLN
INTRAMUSCULAR | Status: DC | PRN
  Administered 2014-04-25: 10 mg via INTRAVENOUS

## 2014-04-25 MED ORDER — ACETAMINOPHEN 10 MG/ML IV SOLN
INTRAVENOUS | Status: DC | PRN
  Administered 2014-04-25: 1000 mg via INTRAVENOUS

## 2014-04-25 MED ORDER — ONDANSETRON HCL 4 MG/2ML IJ SOLN
INTRAMUSCULAR | Status: DC | PRN
  Administered 2014-04-25: 4 mg via INTRAVENOUS

## 2014-04-25 MED ORDER — SODIUM CHLORIDE 0.9 % IV SOLN: Freq: Once | INTRAVENOUS | Status: DC

## 2014-04-25 MED ORDER — BUPIVACAINE HCL (PF) 0.25 % IJ SOLN
INTRAMUSCULAR | Status: AC
  Filled 2014-04-25: qty 30

## 2014-04-25 MED ORDER — MIDAZOLAM HCL 2 MG/2ML IJ SOLN
1.0000 mg | INTRAMUSCULAR | Status: DC | PRN
  Administered 2014-04-25: 2 mg via INTRAVENOUS

## 2014-04-25 MED ORDER — HYDROMORPHONE HCL PF 1 MG/ML IJ SOLN
0.2500 mg | INTRAMUSCULAR | Status: DC | PRN
  Administered 2014-04-25 (×3): 0.5 mg via INTRAVENOUS

## 2014-04-25 MED ORDER — ACETAMINOPHEN 325 MG PO TABS: 650.0000 mg | ORAL_TABLET | Freq: Four times a day (QID) | ORAL | Status: DC | PRN

## 2014-04-25 MED ORDER — DIAZEPAM 2 MG PO TABS: 2.0000 mg | ORAL_TABLET | Freq: Three times a day (TID) | ORAL | Status: DC | PRN

## 2014-04-25 MED ORDER — HYDROMORPHONE HCL PF 1 MG/ML IJ SOLN
1.0000 mg | INTRAMUSCULAR | Status: DC | PRN
  Administered 2014-04-25 – 2014-04-28 (×14): 1 mg via INTRAVENOUS
  Administered 2014-04-28: 0.5 mg via INTRAVENOUS
  Administered 2014-04-29 (×2): 1 mg via INTRAVENOUS
  Filled 2014-04-25 (×17): qty 1

## 2014-04-25 MED ORDER — CEFAZOLIN SODIUM-DEXTROSE 2-3 GM-% IV SOLR
2.0000 g | INTRAVENOUS | Status: AC
  Administered 2014-04-25: 2 g via INTRAVENOUS

## 2014-04-25 MED ORDER — 0.9 % SODIUM CHLORIDE (POUR BTL) OPTIME
TOPICAL | Status: DC | PRN
  Administered 2014-04-25: 1000 mL

## 2014-04-25 MED ORDER — ACETAMINOPHEN 650 MG RE SUPP: 650.0000 mg | Freq: Four times a day (QID) | RECTAL | Status: DC | PRN

## 2014-04-25 MED ORDER — LACTATED RINGERS IV SOLN
INTRAVENOUS | Status: DC
Start: 2014-04-25 — End: 2014-04-25
  Administered 2014-04-25 (×2): via INTRAVENOUS

## 2014-04-25 MED ORDER — FENTANYL CITRATE 0.05 MG/ML IJ SOLN
INTRAMUSCULAR | Status: AC
  Filled 2014-04-25: qty 4

## 2014-04-25 MED ORDER — FENTANYL CITRATE 0.05 MG/ML IJ SOLN
INTRAMUSCULAR | Status: AC
  Filled 2014-04-25: qty 6

## 2014-04-25 MED ORDER — OXYCODONE HCL 5 MG PO TABS
5.0000 mg | ORAL_TABLET | ORAL | Status: DC | PRN
  Administered 2014-04-25 – 2014-04-26 (×4): 5 mg via ORAL
  Administered 2014-04-26 – 2014-04-28 (×5): 10 mg via ORAL
  Filled 2014-04-25 (×2): qty 1
  Filled 2014-04-25 (×2): qty 2
  Filled 2014-04-25 (×3): qty 1
  Filled 2014-04-25 (×3): qty 2

## 2014-04-25 SURGICAL SUPPLY — 93 items
APPLIER CLIP 11 MED OPEN (CLIP)
APPLIER CLIP 9.375 MED OPEN (MISCELLANEOUS)
BAG DECANTER FOR FLEXI CONT (MISCELLANEOUS) IMPLANT
BINDER BREAST LRG (GAUZE/BANDAGES/DRESSINGS) ×4 IMPLANT
BINDER BREAST MEDIUM (GAUZE/BANDAGES/DRESSINGS) ×4 IMPLANT
BINDER BREAST XLRG (GAUZE/BANDAGES/DRESSINGS) IMPLANT
BINDER BREAST XXLRG (GAUZE/BANDAGES/DRESSINGS) IMPLANT
BIOPATCH RED 1 DISK 7.0 (GAUZE/BANDAGES/DRESSINGS) ×6 IMPLANT
BIOPATCH RED 1IN DISK 7.0MM (GAUZE/BANDAGES/DRESSINGS) ×2
BLADE CLIPPER SURG (BLADE) IMPLANT
BLADE HEX COATED 2.75 (ELECTRODE) IMPLANT
BLADE SURG 10 STRL SS (BLADE) ×8 IMPLANT
BLADE SURG 15 STRL LF DISP TIS (BLADE) ×6 IMPLANT
BLADE SURG 15 STRL SS (BLADE) ×6
BNDG GAUZE ELAST 4 BULKY (GAUZE/BANDAGES/DRESSINGS) IMPLANT
CANISTER SUCT 1200ML W/VALVE (MISCELLANEOUS) ×8 IMPLANT
CHLORAPREP W/TINT 26ML (MISCELLANEOUS) ×4 IMPLANT
CLIP APPLIE 11 MED OPEN (CLIP) IMPLANT
CLIP APPLIE 9.375 MED OPEN (MISCELLANEOUS) IMPLANT
CLOSURE WOUND 1/2 X4 (GAUZE/BANDAGES/DRESSINGS) ×2
COVER MAYO STAND STRL (DRAPES) ×4 IMPLANT
COVER TABLE BACK 60X90 (DRAPES) ×4 IMPLANT
DECANTER SPIKE VIAL GLASS SM (MISCELLANEOUS) IMPLANT
DERMABOND ADVANCED (GAUZE/BANDAGES/DRESSINGS) ×6
DERMABOND ADVANCED .7 DNX12 (GAUZE/BANDAGES/DRESSINGS) ×6 IMPLANT
DRAIN CHANNEL 19F RND (DRAIN) ×8 IMPLANT
DRAPE LAPAROSCOPIC ABDOMINAL (DRAPES) ×8 IMPLANT
DRSG PAD ABDOMINAL 8X10 ST (GAUZE/BANDAGES/DRESSINGS) ×12 IMPLANT
ELECT BLADE 4.0 EZ CLEAN MEGAD (MISCELLANEOUS) ×4
ELECT BLADE 6.5 .24CM SHAFT (ELECTRODE) IMPLANT
ELECT REM PT RETURN 9FT ADLT (ELECTROSURGICAL) ×4
ELECTRODE BLDE 4.0 EZ CLN MEGD (MISCELLANEOUS) ×2 IMPLANT
ELECTRODE REM PT RTRN 9FT ADLT (ELECTROSURGICAL) ×2 IMPLANT
EVACUATOR SILICONE 100CC (DRAIN) ×8 IMPLANT
GAUZE SPONGE 4X4 12PLY STRL (GAUZE/BANDAGES/DRESSINGS) ×4 IMPLANT
GLOVE BIO SURGEON STRL SZ 6.5 (GLOVE) ×9 IMPLANT
GLOVE BIO SURGEON STRL SZ7 (GLOVE) ×4 IMPLANT
GLOVE BIO SURGEONS STRL SZ 6.5 (GLOVE) ×3
GLOVE BIOGEL PI IND STRL 7.0 (GLOVE) ×4 IMPLANT
GLOVE BIOGEL PI IND STRL 7.5 (GLOVE) ×4 IMPLANT
GLOVE BIOGEL PI IND STRL 8 (GLOVE) ×2 IMPLANT
GLOVE BIOGEL PI INDICATOR 7.0 (GLOVE) ×4
GLOVE BIOGEL PI INDICATOR 7.5 (GLOVE) ×4
GLOVE BIOGEL PI INDICATOR 8 (GLOVE) ×2
GLOVE ECLIPSE 6.5 STRL STRAW (GLOVE) ×8 IMPLANT
GLOVE EXAM NITRILE MD LF STRL (GLOVE) ×4 IMPLANT
GOWN STRL REUS W/ TWL LRG LVL3 (GOWN DISPOSABLE) ×10 IMPLANT
GOWN STRL REUS W/ TWL XL LVL3 (GOWN DISPOSABLE) ×2 IMPLANT
GOWN STRL REUS W/TWL LRG LVL3 (GOWN DISPOSABLE) ×10
GOWN STRL REUS W/TWL XL LVL3 (GOWN DISPOSABLE) ×2
IV NS 1000ML (IV SOLUTION)
IV NS 1000ML BAXH (IV SOLUTION) IMPLANT
IV NS 500ML (IV SOLUTION)
IV NS 500ML BAXH (IV SOLUTION) IMPLANT
KIT FILL SYSTEM UNIVERSAL (SET/KITS/TRAYS/PACK) IMPLANT
NDL SAFETY ECLIPSE 18X1.5 (NEEDLE) IMPLANT
NEEDLE HYPO 18GX1.5 SHARP (NEEDLE)
NEEDLE HYPO 25X1 1.5 SAFETY (NEEDLE) ×4 IMPLANT
NS IRRIG 1000ML POUR BTL (IV SOLUTION) ×4 IMPLANT
PACK BASIN DAY SURGERY FS (CUSTOM PROCEDURE TRAY) ×4 IMPLANT
PENCIL BUTTON HOLSTER BLD 10FT (ELECTRODE) ×4 IMPLANT
PIN SAFETY STERILE (MISCELLANEOUS) ×4 IMPLANT
SHEET MEDIUM DRAPE 40X70 STRL (DRAPES) ×4 IMPLANT
SLEEVE SCD COMPRESS KNEE MED (MISCELLANEOUS) ×4 IMPLANT
SPONGE GAUZE 4X4 12PLY STER LF (GAUZE/BANDAGES/DRESSINGS) IMPLANT
SPONGE LAP 18X18 X RAY DECT (DISPOSABLE) ×16 IMPLANT
SPONGE LAP 4X18 X RAY DECT (DISPOSABLE) IMPLANT
STAPLER VISISTAT 35W (STAPLE) ×4 IMPLANT
STOCKINETTE IMPERVIOUS LG (DRAPES) IMPLANT
STRIP CLOSURE SKIN 1/2X4 (GAUZE/BANDAGES/DRESSINGS) ×6 IMPLANT
SUT ETHILON 2 0 FS 18 (SUTURE) ×8 IMPLANT
SUT MNCRL AB 3-0 PS2 18 (SUTURE) ×8 IMPLANT
SUT MNCRL AB 4-0 PS2 18 (SUTURE) ×8 IMPLANT
SUT MON AB 5-0 PS2 18 (SUTURE) ×8 IMPLANT
SUT PDS AB 2-0 CT2 27 (SUTURE) IMPLANT
SUT PROLENE 3 0 PS 2 (SUTURE) IMPLANT
SUT SILK 2 0 SH (SUTURE) ×4 IMPLANT
SUT SILK 3 0 PS 1 (SUTURE) ×4 IMPLANT
SUT VIC AB 3-0 SH 27 (SUTURE) ×2
SUT VIC AB 3-0 SH 27X BRD (SUTURE) ×2 IMPLANT
SUT VICRYL 3-0 CR8 SH (SUTURE) ×4 IMPLANT
SUT VICRYL 4-0 PS2 18IN ABS (SUTURE) ×4 IMPLANT
SWAB COLLECTION DEVICE MRSA (MISCELLANEOUS) IMPLANT
SYR 50ML LL SCALE MARK (SYRINGE) IMPLANT
SYR BULB IRRIGATION 50ML (SYRINGE) ×4 IMPLANT
SYR CONTROL 10ML LL (SYRINGE) ×4 IMPLANT
TOWEL OR 17X24 6PK STRL BLUE (TOWEL DISPOSABLE) ×8 IMPLANT
TOWEL OR NON WOVEN STRL DISP B (DISPOSABLE) IMPLANT
TUBE ANAEROBIC SPECIMEN COL (MISCELLANEOUS) IMPLANT
TUBE CONNECTING 20'X1/4 (TUBING) ×1
TUBE CONNECTING 20X1/4 (TUBING) ×3 IMPLANT
UNDERPAD 30X30 INCONTINENT (UNDERPADS AND DIAPERS) ×8 IMPLANT
YANKAUER SUCT BULB TIP NO VENT (SUCTIONS) ×4 IMPLANT

## 2014-04-25 NOTE — H&P (View-Only) (Signed)
Cheryl Holloway is an 38 y.o. female.   Chief Complaint: Left breast cancer HPI:The patient is a 38 yrs old bf here for a history and physical for removal of a ruptured right silicone breast implant. 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 She was treated with a right mastectomy with 3 sentinel nodes. She received chemotherapy followed by tamoxifen and no radiation. She underwent a right breast reconstruction with silicone implant placement and NAC recontruction. She is not sure about the size. She had a left breast reduction/mastopexy. She complains of tightness, clawing and pain in the right breast area. She had 3 surgeries to try to help the issue but still has the pain. At this point she wants to have a left mastecomy and removal of the right implant without reconstruction. She appears to have some pectoralis coverage of the implant. There is some distortion and hard areas noted at the right breast. She does not want any implants and understands she may be losing her NAP as well.  Past Medical History  Diagnosis Date  . Breast cancer   . Anemia     takes iron    Past Surgical History  Procedure Laterality Date  . Breast reconstruction    . Breast reduction surgery    . Gastric bypass    . Tubal ligation    . Cesarean section      x3  . Mastectomy      right    No family history on file. Social History:  reports that she has never smoked. She does not have any smokeless tobacco history on file. She reports that she drinks alcohol. She reports that she does not use illicit drugs.  Allergies: No Known Allergies   (Not in a hospital admission)  No results found for this or any previous visit (from the past 48 hour(s)). No results found.  Review of Systems  Constitutional: Negative.   HENT: Negative.   Eyes: Negative.   Respiratory: Negative.   Cardiovascular: Negative.    Gastrointestinal: Negative.   Genitourinary: Negative.   Musculoskeletal: Negative.   Skin: Negative.     Last menstrual period 03/27/2014. Physical Exam  Constitutional: She is oriented to person, place, and time. She appears well-developed and well-nourished.  HENT:  Head: Normocephalic and atraumatic.  Eyes: Conjunctivae and EOM are normal. Pupils are equal, round, and reactive to light.  Cardiovascular: Normal rate.   Respiratory: Effort normal.  Musculoskeletal: Normal range of motion.  Neurological: She is alert and oriented to person, place, and time.  Skin: Skin is warm.  Psychiatric: She has a normal mood and affect. Her behavior is normal. Judgment and thought content normal.     Assessment/Plan Removal of right breast implant and capsulectomy (No implant being placed).  [place] 04/23/2014, 11:40 AM

## 2014-04-25 NOTE — Progress Notes (Signed)
OK per Dr Linna Caprice to start IV on right side

## 2014-04-25 NOTE — Progress Notes (Signed)
2Assisted Dr. Linna Caprice with left, ultrasound guided, pectoralis block. Side rails up, monitors on throughout procedure. See vital signs in flow sheet. Tolerated Procedure well.

## 2014-04-25 NOTE — Op Note (Addendum)
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 female born on 06/18/1976, is here for treatment of a ruptured right breast implant with capsular contracture.    PROCEDURE DETAILS:  The patient was taken to the operating room on the morning of the surgery and given a general anesthetic. General surgery performed a left mastectomy.  The patient was then rendered to the plastic surgery service.  New drapes were placed around the right breast.  Local anesthetic was injected into the incision site from the previous surgery. The #10 blade was used to dissect down to the capsule.  The capsule was freed from the soft tissue, chest wall and superficial tissue.  The implant and capsule was removed completely.  Hemostasis was achieved with electrocautery.  The pocket was irrigated with antibiotic solution.  The pectoralis muscle was very thin and was preserved.  The excess skin was excised and the edges brought together.  The deep layers were closed with 3-0 Monocryl.  A drain was placed and secured with a 3-0 Silk.  The following tissue was closed with 4-0 and 5-0 Monocryl.  Dermabond was applied.  The patient tolerated the procedure well.  There were no complications.  The patient was allowed to wake from anesthesia and taken to the recovery room in stable condition.  The implant was a 528 cc silicone implant.

## 2014-04-25 NOTE — Anesthesia Postprocedure Evaluation (Signed)
  Anesthesia Post-op Note  Patient: Recruitment consultant  Procedure(s) Performed: Procedure(s): LEFT TOTAL  MASTECTOMY (PROPHYLACTIC) (Left) REMOVAL BREAST IMPLANT WITH CAPSULECTOMY (Right)  Patient Location: PACU  Anesthesia Type:General and block  Level of Consciousness: awake and alert   Airway and Oxygen Therapy: Patient Spontanous Breathing  Post-op Pain: moderate  Post-op Assessment: Post-op Vital signs reviewed, Patient's Cardiovascular Status Stable and Respiratory Function Stable  Post-op Vital Signs: Reviewed  Filed Vitals:   04/25/14 1306  BP: 131/73  Pulse: 68  Temp: 36.4 C  Resp: 18    Complications: No apparent anesthesia complications

## 2014-04-25 NOTE — H&P (Signed)
This is a 38 year old female who presents as a new consult for possible left prophylactic mastectomy. Her history involves a right breast cancer at age 69. She had no family history. She states that she was tested for BRCA1 and 2 and this was negative. 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 This was estrogen and progesterone receptor positive and HER-2/neu was not amplified. She then had adjuvant chemotherapy. She had tamoxifen until 2011 and she stopped this due to her insurance. She did tolerate this before. Her oncotype previously was 13. She did not receive radiation. She is at a total of 4 surgeries on the right breast due to issues with an implant. She has significant discomfort there and felt like she had a mass there now. She very much wants this implant removed. She is on since undergone an MRI which shows probable rupture of the implant. She has been seen by Dr. SMigdalia Dkwho is planning on removing the implant. She does not want any more reconstruction. The patient desires to have a left mastectomy also.   Past Medical History   Diagnosis  Date   .  Breast cancer     Past Surgical History   Procedure  Laterality  Date   .  Mastectomy     .  Breast reconstruction     .  Breast reduction surgery     .  Gastric bypass     History reviewed. No pertinent family history.  Social History  History   Substance Use Topics   .  Smoking status:  Never Smoker   .  Smokeless tobacco:  Not on file   .  Alcohol Use:  Yes   No Known Allergies  Current Outpatient Prescriptions   Medication  Sig  Dispense  Refill   .  fish oil-omega-3 fatty acids 1000 MG capsule  Take 1 g by mouth daily.     .  Ibuprofen-Diphenhydramine HCl (ADVIL PM) 200-25 MG CAPS  Take 1 capsule by mouth at bedtime as needed.     .  IRON PO  Take 1 tablet by mouth daily.     .  Multiple Vitamin (MULTIVITAMIN WITH MINERALS) TABS  Take 1 tablet by mouth daily.     .Marland Kitchen OVER THE  COUNTER MEDICATION  1 tablet daily of Potassium     .  vitamin B-12 (CYANOCOBALAMIN) 500 MCG tablet  Take 500 mcg by mouth daily.     .Marland Kitchen VITAMIN E PO  Take 400 Units by mouth daily.      No current facility-administered medications for this visit.   Review of Systems  Review of Systems  Constitutional: Negative for fever, chills and unexpected weight change.  HENT: Negative for congestion, hearing loss, sore throat, trouble swallowing and voice change.  Eyes: Negative for visual disturbance.  Respiratory: Negative for cough and wheezing.  Cardiovascular: Negative for chest pain, palpitations and leg swelling.  Gastrointestinal: Negative for nausea, vomiting, abdominal pain, diarrhea, constipation, blood in stool, abdominal distention and anal bleeding.   Physical Exam  Physical Exam  Vitals reviewed.  Constitutional: She appears well-developed and well-nourished.  Neck: Neck supple.  Cardiovascular: Normal rate, regular rhythm and normal heart sounds.  Pulmonary/Chest: Effort normal and breath sounds normal. She has no wheezes. She has no rales. Right breast exhibits tenderness. Right breast exhibits no mass. Left breast exhibits no inverted nipple, no mass, no nipple discharge, no skin change and no tenderness. Breasts  are asymmetrical (right sided implant in place).    Lymphadenopathy:  She has no cervical adenopathy.  She has no axillary adenopathy.  Right: No supraclavicular adenopathy present.  Left: No supraclavicular adenopathy present.   Data Reviewed  EXAM:  BILATERAL BREAST MRI WITH AND WITHOUT CONTRAST  TECHNIQUE:  Multiplanar, multisequence MR images of both breasts were obtained  prior to and following the intravenous administration of 52m of  MultiHance.  THREE-DIMENSIONAL MR IMAGE RENDERING ON INDEPENDENT WORKSTATION:  Three-dimensional MR images were rendered by post-processing of the  original MR data on an independent workstation. The  three-dimensional MR  images were interpreted, and findings are  reported in the following complete MRI report for this study. Three  dimensional images were evaluated at the independent DynaCad  workstation  COMPARISON: Previous exams  FINDINGS:  Breast composition: Patient is status post right mastectomy with  implant reconstruction. The left breast demonstrates heterogeneous  fibroglandular tissue.  Background parenchymal enhancement: There is a moderate degree of  background parenchymal enhancement with scattered foci of non mass  enhancement with benign MR features in the left breast.  Right breast: Status post right mastectomy with silicone implant  reconstruction. Silicone sensitive sequences are not included in  today's study to evaluate for implant integrity. In the region of  palpable concern along the upper-outer implant capsule, note is made  of a probable capsular cyst or implant herniation (a form of  intracapsular rupture) which is a benign finding. No abnormal  enhancement.  Left breast: No abnormal mass or suspicious enhancement.  Lymph nodes: No abnormal appearing lymph nodes.  Ancillary findings: None.  IMPRESSION:  1. Status post right mastectomy without MRI findings of new or  recurrent malignancy.  2. 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 This is a benign  finding. If further evaluation is clinically indicated or if there  is desire for implant replacement, noncontrast breast MR with  implant protocol may be considered.  3. No axillary adenopathy.  RECOMMENDATION:  1. There is a probable intracapsular rupture along the upper-outer  implant capsule the region of palpable concern. If further  evaluation is clinically indicated or if there is desire for implant  replacement, a noncontrast breast MR with implant protocol may be  considered.  2. Recommend annual diagnostic mammography.  BI-RADS CATEGORY 2: Benign.     Assessment  T1N122m0 right breast cancer  Ruptured implant   Plan  Left prophylactic mastectomy  On discussion with her today due to her fact that her genetic testing is negative there is non-oncologic indication to do a left mastectomy. I do not think this will increase her survival. I also told her this will not prevent her from developing breast cancer on the left side there is up to a 5% chance that she could still have breast cancer after a mastectomy. However she is going to have the right implant removed and she has decided that she would like to have symmetry. I think it is reasonable given her age to consider this. We discussed a left total mastectomy with removal of the nipple areolar complex. Again she does not desire reconstruction. We discussed the usage of drains as well as some of the issues postoperatively. The risks include but are not limited to bleeding, infection, reoperation for hematoma, this may not be completely flat and may have some wrinkles and some excess skin.  I dont think sentinel node is indicated given normal exam  and mri. Risk of lymphedema from surgery is more than risk of having an invasive cancer.

## 2014-04-25 NOTE — Progress Notes (Signed)
Received report from Sport and exercise psychologist at [facility]. PRBCs prepared per MD orders. Awaiting pt to arrive to unit via EMS.

## 2014-04-25 NOTE — Transfer of Care (Signed)
Immediate Anesthesia Transfer of Care Note  Patient: Cheryl Holloway  Procedure(s) Performed: Procedure(s) (LRB): LEFT TOTAL  MASTECTOMY (PROPHYLACTIC) (Left) REMOVAL BREAST IMPLANT WITH CAPSULECTOMY (Right)  Patient Location: PACU  Anesthesia Type: General  Level of Consciousness: awake, alert  and oriented  Airway & Oxygen Therapy: Patient Spontanous Breathing and Patient connected to face mask oxygen  Post-op Assessment: Report given to PACU RN and Post -op Vital signs reviewed and stable  Post vital signs: Reviewed and stable  Complications: No apparent anesthesia complications

## 2014-04-25 NOTE — Interval H&P Note (Signed)
History and Physical Interval Note:  04/25/2014 7:26 AM  Cheryl Holloway  has presented today for surgery, with the diagnosis of history right breast cancer,ruptured right breast implant  The various methods of treatment have been discussed with the patient and family. After consideration of risks, benefits and other options for treatment, the patient has consented to  Procedure(s): LEFT TOTAL  MASTECTOMY (PROPHYLACTIC) (Left) REMOVAL BREAST IMPLANTS and capsulectomy (Right) as a surgical intervention .  The patient's history has been reviewed, patient examined, no change in status, stable for surgery.  I have reviewed the patient's chart and labs.  Questions were answered to the patient's satisfaction.     SANGER,CLAIRE

## 2014-04-25 NOTE — Brief Op Note (Signed)
04/25/2014  9:47 AM  PATIENT:  Cheryl Holloway  38 y.o. female  PRE-OPERATIVE DIAGNOSIS:  History right breast cancer,ruptured right breast implant  POST-OPERATIVE DIAGNOSIS:  History right breast cancer,ruptured right breast implant  PROCEDURE:  Procedure(s): LEFT TOTAL  MASTECTOMY (PROPHYLACTIC) (Left) REMOVAL BREAST IMPLANT WITH CAPSULECTOMY (Right)  SURGEON:  Surgeon(s) and Role: Panel 1:    * Rolm Bookbinder, MD - Primary  Panel 2:    * Theodoro Kos, DO - Primary  PHYSICIAN ASSISTANT: Shawn Rayburn, PA  ASSISTANTS: none   ANESTHESIA:   general  EBL:  Total I/O In: 1300 [I.V.:1300] Out: -   BLOOD ADMINISTERED:none  DRAINS: one  LOCAL MEDICATIONS USED:  MARCAINE     SPECIMEN:  Source of Specimen:  implant, capsule and skin  DISPOSITION OF SPECIMEN:  PATHOLOGY  COUNTS:  YES  TOURNIQUET:  * No tourniquets in log *  DICTATION: .Dragon Dictation  PLAN OF CARE: PACU and possible overnight stay  PATIENT DISPOSITION:  PACU - hemodynamically stable.   Delay start of Pharmacological VTE agent (>24hrs) due to surgical blood loss or risk of bleeding: no

## 2014-04-25 NOTE — Discharge Instructions (Signed)
[facility] [place] °[phone] ° °MASTECTOMY: POST OP INSTRUCTIONS ° °Always review your discharge instruction sheet given to you by the facility where your surgery was performed. °IF YOU HAVE DISABILITY OR FAMILY LEAVE FORMS, YOU MUST BRING THEM TO THE OFFICE FOR PROCESSING.   °DO NOT GIVE THEM TO YOUR DOCTOR. °A prescription for pain medication may be given to you upon discharge.  Take your pain medication as prescribed, if needed.  If narcotic pain medicine is not needed, then you may take acetaminophen (Tylenol), naprosyn (Alleve) or ibuprofen (Advil) as needed. °1. Take your usually prescribed medications unless otherwise directed. °2. If you need a refill on your pain medication, please contact your pharmacy.  They will contact our office to request authorization.  Prescriptions will not be filled after 5pm or on week-ends. °3. You should follow a light diet the first few days after arrival home, such as soup and crackers, etc.  Resume your normal diet the day after surgery. °4. Most patients will experience some swelling and bruising on the chest and underarm.  Ice packs will help.  Swelling and bruising can take several days to resolve. Wear the binder day and night until you return to the office.  °5. It is common to experience some constipation if taking pain medication after surgery.  Increasing fluid intake and taking a stool softener (such as Colace) will usually help or prevent this problem from occurring.  A mild laxative (Milk of Magnesia or Miralax) should be taken according to package instructions if there are no bowel movements after 48 hours. °6. Unless discharge instructions indicate otherwise, leave your bandage dry and in place until your next appointment in 3-5 days.  You may take a limited sponge bath.  No tube baths or showers until the drains are removed.  You may have steri-strips (small skin tapes) in place directly over the incision.  These strips should be left on the  skin for 7-10 days. If you have glue it will come off in next couple week.  Any sutures will be removed at an office visit °7. DRAINS:  If you have drains in place, it is important to keep a list of the amount of drainage produced each day in your drains.  Before leaving the hospital, you should be instructed on drain care.  Call our office if you have any questions about your drains. I will remove your drains when they put out less than 30 cc or ml for 2 consecutive days. °8. ACTIVITIES:  You may resume regular (light) daily activities beginning the next day--such as daily self-care, walking, climbing stairs--gradually increasing activities as tolerated.  You may have sexual intercourse when it is comfortable.  Refrain from any heavy lifting or straining until approved by your doctor. °a. You may drive when you are no longer taking prescription pain medication, you can comfortably wear a seatbelt, and you can safely maneuver your car and apply brakes. °b. RETURN TO WORK:  __________________________________________________________ °9. You should see your doctor in the office for a follow-up appointment approximately 3-5 days after your surgery.  Your doctor’s nurse will typically make your follow-up appointment when she calls you with your pathology report.  Expect your pathology report 3-4business days after surgery. °10. OTHER INSTRUCTIONS: ______________________________________________________________________________________________ ____________________________________________________________________________________________ °WHEN TO CALL YOUR DR WAKEFIELD: °1. Fever over 101.0 °2. Nausea and/or vomiting °3. Extreme swelling or bruising °4. Continued bleeding from incision. °5. Increased pain, redness, or drainage from the incision. °The clinic staff is available   to answer your questions during regular business hours.  Please dont hesitate to call and ask to speak to one of the nurses for clinical concerns.  If  you have a medical emergency, go to the nearest emergency room or call 911.  A surgeon from [facility] is always on call at the hospital. [address] [place], [place], [place]  [address] ? [address], [place], [place]   [address] [phone] ? [phone] ? FAX [phone] Web site: [email or web address]   Post Anesthesia Home Care Instructions  Activity: Get plenty of rest for the remainder of the day. A responsible adult should stay with you for 24 hours following the procedure.  For the next 24 hours, DO NOT: -Drive a car -Paediatric nurse -Drink alcoholic beverages -Take any medication unless instructed by your physician -Make any legal decisions or sign important papers.  Meals: Start with liquid foods such as gelatin or soup. Progress to regular foods as tolerated. Avoid greasy, spicy, heavy foods. If nausea and/or vomiting occur, drink only clear liquids until the nausea and/or vomiting subsides. Call your physician if vomiting continues.  Special Instructions/Symptoms: Your throat may feel dry or sore from the anesthesia or the breathing tube placed in your throat during surgery. If this causes discomfort, gargle with warm salt water. The discomfort should disappear within 24 hours.  About my Jackson-Pratt Bulb Drain  What is a Jackson-Pratt bulb? A Jackson-Pratt is a soft, round device used to collect drainage. It is connected to a long, thin drainage catheter, which is held in place by one or two small stiches near your surgical incision site. When the bulb is squeezed, it forms a vacuum, forcing the drainage to empty into the bulb.  Emptying the Jackson-Pratt bulb- To empty the bulb: 1. Release the plug on the top of the bulb. 2. Pour the bulb's contents into a measuring container which your nurse will provide. 3. Record the time emptied and amount of drainage. Empty the drain(s) as often as your     doctor or nurse  recommends.  Date                  Time                    Amount (Drain 1)                 Amount (Drain 2)  _____________________________________________________________________  _____________________________________________________________________  _____________________________________________________________________  _____________________________________________________________________  _____________________________________________________________________  _____________________________________________________________________  _____________________________________________________________________  _____________________________________________________________________  Squeezing the Jackson-Pratt Bulb- To squeeze the bulb: 1. Make sure the plug at the top of the bulb is open. 2. Squeeze the bulb tightly in your fist. You will hear air squeezing from the bulb. 3. Replace the plug while the bulb is squeezed. 4. Use a safety pin to attach the bulb to your clothing. This will keep the catheter from     pulling at the bulb insertion site.  When to call your doctor- Call your doctor if:  Drain site becomes red, swollen or hot.  You have a fever greater than 101 degrees F.  There is oozing at the drain site.  Drain falls out (apply a guaze bandage over the drain hole and secure it with tape).  Drainage increases daily not related to activity patterns. (You will usually have more drainage when you are active than when you are resting.)  Drainage has a bad odor.

## 2014-04-25 NOTE — Op Note (Signed)
Preoperative diagnosis: history right breast cancer Postoperative diagnosis: same as above Procedure: left total mastectomy Surgeon: Dr Serita Grammes Anesthesia: GETA EBL: minimal Drains: 2 19 Fr Blake drains to mastectomy space Specimen: left breast marked short superior, long lateral Complications: none Disposition case turned over to Dr Migdalia Dk for right side  Indications: This is a 78 yof with prior history of right breast cancer with difficulty with implants.  She is undergoing removal of her implant and desires a left prophylactic mastectomy.    Procedure: After informed consent was obtained the patient was taken to the operating room.  She was given cefazolin. She had SCDs in place.  She was placed under general anesthesia without complication.  She was then prepped and draped in the standard sterile surgical fashion.  A surgical timeout was performed.    I made an elliptical incision encompassing the nipple areolar complex.  She had a prior reduction scar.  I then made flaps to clavicle, sternum, inframammary crease and the latissiumus laterally. This was somewhat difficult due to prior scarring. I then removed the breast and fascia from the pectoralis muscle.  Irrigation was performed. Hemostasis was observed. I then inserted 2 19Fr Blake drains and secured them with 2-0 nylon suture.  I then closed with 3-0 vicryl and 4-0 monocryl.  Dermabond and steristrips were applied.  She tolerated this portion well. Drains were functional. Skin was viable.  The case was then turned over to Dr Migdalia Dk for the right side.

## 2014-04-25 NOTE — Progress Notes (Signed)
Pt arrived to unit accompanied by EMS. VS stable. Pt oriented to unit.

## 2014-04-25 NOTE — Anesthesia Postprocedure Evaluation (Signed)
Anesthesia Post Note  Patient: Cheryl Holloway  Procedure(s) Performed: Procedure(s) (LRB): LEFT TOTAL  MASTECTOMY (PROPHYLACTIC) (Left) REMOVAL BREAST IMPLANT WITH CAPSULECTOMY (Right)  Anesthesia type: general  Patient location: PACU  Post pain: Pain level controlled  Post assessment: Patient's Cardiovascular Status Stable  Last Vitals:  Filed Vitals:   04/25/14 1400  BP: 113/83  Pulse: 84  Temp: 36.1 C  Resp:     Post vital signs: Reviewed and stable  Level of consciousness: sedated  Complications: No apparent anesthesia complications

## 2014-04-25 NOTE — Anesthesia Preprocedure Evaluation (Signed)
Anesthesia Evaluation  Patient identified by MRN, date of birth, ID band Patient awake    Reviewed: Allergy & Precautions, H&P , NPO status , Patient's Chart, lab work & pertinent test results  Airway Mallampati: II TM Distance: >3 FB Neck ROM: Full    Dental  (+) Teeth Intact, Dental Advisory Given   Pulmonary  breath sounds clear to auscultation        Cardiovascular Rhythm:Regular Rate:Normal     Neuro/Psych    GI/Hepatic   Endo/Other    Renal/GU      Musculoskeletal   Abdominal   Peds  Hematology   Anesthesia Other Findings   Reproductive/Obstetrics                           Anesthesia Physical Anesthesia Plan  ASA: II  Anesthesia Plan: General   Post-op Pain Management:    Induction: Intravenous  Airway Management Planned: Oral ETT  Additional Equipment:   Intra-op Plan:   Post-operative Plan: Extubation in OR  Informed Consent: I have reviewed the patients History and Physical, chart, labs and discussed the procedure including the risks, benefits and alternatives for the proposed anesthesia with the patient or authorized representative who has indicated his/her understanding and acceptance.   Dental advisory given  Plan Discussed with: CRNA and Anesthesiologist  Anesthesia Plan Comments:         Anesthesia Quick Evaluation

## 2014-04-25 NOTE — Anesthesia Procedure Notes (Addendum)
Procedure Name: Intubation Date/Time: 04/25/2014 7:39 AM Performed by: Mechele Claude Pre-anesthesia Checklist: Patient identified, Emergency Drugs available, Suction available and Patient being monitored Patient Re-evaluated:Patient Re-evaluated prior to inductionOxygen Delivery Method: Circle System Utilized Preoxygenation: Pre-oxygenation with 100% oxygen Intubation Type: IV induction Ventilation: Mask ventilation without difficulty Laryngoscope Size: Mac and 3 Grade View: Grade I Tube type: Oral Number of attempts: 1 Airway Equipment and Method: stylet and oral airway Placement Confirmation: ETT inserted through vocal cords under direct vision,  positive ETCO2 and breath sounds checked- equal and bilateral Secured at: 21 cm Tube secured with: Tape Dental Injury: Teeth and Oropharynx as per pre-operative assessment     Anesthesia Regional Block:  Pectoralis block  Pre-Anesthetic Checklist: ,, timeout performed, Correct Patient, Correct Site, Correct Laterality, Correct Procedure, Correct Position, site marked, Risks and benefits discussed, pre-op evaluation,  At surgeon's request and post-op pain management  Laterality: Left  Prep: chloraprep       Needles:  Injection technique: Single-shot     Needle Length: 9cm 9 cm Needle Gauge: 22 and 22 G    Additional Needles:  Procedures: ultrasound guided (picture in chart) Pectoralis block Narrative:  Start time: 04/25/2014 7:10 AM End time: 04/25/2014 7:15 AM Injection made incrementally with aspirations every 5 mL.  Performed by: Personally   Additional Notes: 25 cc 0.5% Marcaine 1:200 Epi

## 2014-04-25 NOTE — Progress Notes (Signed)
Patient ID: Cheryl Holloway, female   DOB: 02-13-1976, 38 y.o.   MRN: [id number] Patient with nl vital signs, no tachycardia and nl bp, volume out of all three drains serosang and high, no real hematoma under flaps on either side.  Had some blood around drains on both side also.  hct 20.5 down preop.  I don't think needs to return to or right now. Both sides were difficult due to scarring. I do think she needs transfusion and I wrote for 2 units, transfer to [place] for closer monitoring. Will follow drains, monitor now, recheck cbc after tranfusion.

## 2014-04-26 ENCOUNTER — Encounter (HOSPITAL_BASED_OUTPATIENT_CLINIC_OR_DEPARTMENT_OTHER): Payer: Self-pay | Admitting: General Surgery

## 2014-04-26 LAB — CBC
HCT: 27.5 % — ABNORMAL LOW (ref 36.0–46.0)
Hemoglobin: 9.4 g/dL — ABNORMAL LOW (ref 12.0–15.0)
MCH: 27.7 pg (ref 26.0–34.0)
MCHC: 34.2 g/dL (ref 30.0–36.0)
MCV: 81.1 fL (ref 78.0–100.0)
PLATELETS: 149 10*3/uL — AB (ref 150–400)
RBC: 3.39 MIL/uL — AB (ref 3.87–5.11)
RDW: 15.2 % (ref 11.5–15.5)
WBC: 6.8 10*3/uL (ref 4.0–10.5)

## 2014-04-26 NOTE — Plan of Care (Signed)
Problem: Phase I Progression Outcomes Goal: Tolerating diet Outcome: Not Progressing Vomited at Supper time tonight

## 2014-04-26 NOTE — Progress Notes (Signed)
Utilization review completed. Zamarian Scarano, RN, BSN. 

## 2014-04-26 NOTE — Progress Notes (Signed)
1 Day Post-Op  Subjective: Feels better hungry  Objective: Vital signs in last 24 hours: Temp:  [96.9 F (36.1 C)-98 F (36.7 C)] 97.3 F (36.3 C) (09/11 0340) Pulse Rate:  [53-94] 79 (09/11 0340) Resp:  [4-18] 14 (09/11 0340) BP: (105-165)/(59-94) 113/61 mmHg (09/11 0340) SpO2:  [96 %-100 %] 99 % (09/11 0340)    Intake/Output from previous day: 09/10 0701 - 09/11 0700 In: 3570 [P.O.:450; I.V.:2400; Blood:670; IV Piggyback:50] Out: 1469.5 [Urine:700; Drains:769.5] Intake/Output this shift: Total I/O In: 1220 [I.V.:500; Blood:670; IV Piggyback:50] Out: 230 [Drains:230]  cta bilaterally rrr Chest without hematoma, drains with serosang fluid, left side with less bloody drainage, some ecchymosis in axilla  Lab Results:   Recent Labs  04/25/14 1517 04/25/14 1523 04/26/14 0345  WBC 9.5  --  6.8  HGB 6.6* 6.4* 9.4*  HCT 20.5*  --  27.5*  PLT 166  --  149*   BMET  Recent Labs  04/24/14 1400  NA 140  K 4.4  CL 107  CO2 24  GLUCOSE 119*  BUN 7  CREATININE 0.73  CALCIUM 8.3*    Anti-infectives: Anti-infectives   Start     Dose/Rate Route Frequency Ordered Stop   04/25/14 1400  ceFAZolin (ANCEF) IVPB 2 g/50 mL premix     2 g 100 mL/hr over 30 Minutes Intravenous 3 times per day 04/25/14 1244 04/26/14 1359   04/25/14 0928  polymyxin B 500,000 Units, bacitracin 50,000 Units in sodium chloride irrigation 0.9 % 500 mL irrigation  Status:  Discontinued       As needed 04/25/14 0948 04/25/14 1021   04/25/14 0609  ceFAZolin (ANCEF) IVPB 2 g/50 mL premix     2 g 100 mL/hr over 30 Minutes Intravenous On call to O.R. 04/25/14 6269 04/25/14 0732      Assessment/Plan: POD 1 left mastectomy, right removal implant/capsulectomy  1. Pain controlled 2. pulm toilet 3. Bled at surgery now appears this is better, I do not think there is hematoma to evacuate. Due to bleeding, and symptoms I did give transfusion last night as we discussed. She has responded appropriately.   She started out anemia.  Will monitor today, recheck cbc in am and hopefully home tomorrow 4. scds only   [facility] 04/26/2014

## 2014-04-26 NOTE — Clinical Documentation Improvement (Signed)
04/26/14  Patient post OR with H/H decrease 9.0/27.7 to 6.6/20.5 requiring 2u PRBC. Please add diagnosis treated to Notes to reflect severity of illness and risk of mortality. Thank you.  Possible Clinical Conditions?   Expected Acute Blood Loss Anemia  Acute Blood Loss Anemia  Acute on chronic blood loss anemia  Chronic blood loss anemia  Precipitous drop in Hematocrit  Other Condition  Thank You, Ezekiel Ina ,RN Clinical Documentation Specialist:  [phone]  [employer] Information Management

## 2014-04-26 NOTE — Progress Notes (Signed)
1 Day Post-Op (Late entry- Patient was seen at 7:45 am) Subjective: Patient reports feeling better this am. Tolerated transfusion well last pm.  Pain is under good control overall.  Bilateral breast flaps with some minimal bruising , but no hematoma or signs of infection.  JP drain OP is still bloody, but improving overnight.  Hgb up to 9.4 following transfusion.   Objective: Vital signs in last 24 hours: Temp:  [96.9 F (36.1 C)-98 F (36.7 C)] 97.5 F (36.4 C) (09/11 0800) Pulse Rate:  [68-94] 86 (09/11 0745) Resp:  [7-20] 20 (09/11 0745) BP: (105-144)/(54-94) 120/54 mmHg (09/11 0745) SpO2:  [96 %-100 %] 100 % (09/11 0745)    Intake/Output from previous day: 09/10 0701 - 09/11 0700 In: 3800 [P.O.:450; I.V.:2580; Blood:670; IV Piggyback:100] Out: 1519.5 [Urine:700; Drains:819.5] Intake/Output this shift: Total I/O In: 200 [I.V.:200] Out: 35 [Drains:35]  General appearance: alert, cooperative and no distress Resp: clear to auscultation bilaterally Cardio: regular rate and rhythm Bilateral breast flaps without signs of hematoma or infection. Some mild bruising, right skin greater than left.    Lab Results:   Recent Labs  04/25/14 1517 04/25/14 1523 04/26/14 0345  WBC 9.5  --  6.8  HGB 6.6* 6.4* 9.4*  HCT 20.5*  --  27.5*  PLT 166  --  149*   BMET  Recent Labs  04/24/14 1400  NA 140  K 4.4  CL 107  CO2 24  GLUCOSE 119*  BUN 7  CREATININE 0.73  CALCIUM 8.3*   PT/INR No results found for this basename: LABPROT, INR,  in the last 72 hours ABG No results found for this basename: PHART, PCO2, PO2, HCO3,  in the last 72 hours  Studies/Results: No results found.  Anti-infectives: Anti-infectives   Start     Dose/Rate Route Frequency Ordered Stop   04/25/14 1400  ceFAZolin (ANCEF) IVPB 2 g/50 mL premix     2 g 100 mL/hr over 30 Minutes Intravenous 3 times per day 04/25/14 1244 04/26/14 0717   04/25/14 0928  polymyxin B 500,000 Units, bacitracin 50,000  Units in sodium chloride irrigation 0.9 % 500 mL irrigation  Status:  Discontinued       As needed 04/25/14 0948 04/25/14 1021   04/25/14 0609  ceFAZolin (ANCEF) IVPB 2 g/50 mL premix     2 g 100 mL/hr over 30 Minutes Intravenous On call to O.R. 04/25/14 0609 04/25/14 0732      Assessment/Plan: s/p Procedure(s): LEFT TOTAL  MASTECTOMY (PROPHYLACTIC) (Left) REMOVAL BREAST IMPLANT WITH CAPSULECTOMY (Right) Transfer out to floor bed per Dr. Orma Flaming OOB Hopefully home tomorrow if continues to do well.   LOS: 1 day    [facility] [phone]

## 2014-04-27 LAB — CBC
HCT: 21.9 % — ABNORMAL LOW (ref 36.0–46.0)
HEMATOCRIT: 24.7 % — AB (ref 36.0–46.0)
HEMOGLOBIN: 7.3 g/dL — AB (ref 12.0–15.0)
Hemoglobin: 8.3 g/dL — ABNORMAL LOW (ref 12.0–15.0)
MCH: 28 pg (ref 26.0–34.0)
MCH: 27.2 pg (ref 26.0–34.0)
MCHC: 33.6 g/dL (ref 30.0–36.0)
MCHC: 33.3 g/dL (ref 30.0–36.0)
MCV: 83.4 fL (ref 78.0–100.0)
MCV: 81.7 fL (ref 78.0–100.0)
Platelets: 131 10*3/uL — ABNORMAL LOW (ref 150–400)
Platelets: 155 10*3/uL (ref 150–400)
RBC: 2.96 MIL/uL — ABNORMAL LOW (ref 3.87–5.11)
RBC: 2.68 MIL/uL — AB (ref 3.87–5.11)
RDW: 15.7 % — ABNORMAL HIGH (ref 11.5–15.5)
RDW: 16.1 % — ABNORMAL HIGH (ref 11.5–15.5)
WBC: 5.6 10*3/uL (ref 4.0–10.5)
WBC: 5.4 10*3/uL (ref 4.0–10.5)

## 2014-04-27 LAB — PREPARE RBC (CROSSMATCH)

## 2014-04-27 MED ORDER — ONDANSETRON HCL 4 MG PO TABS
4.0000 mg | ORAL_TABLET | Freq: Four times a day (QID) | ORAL | Status: DC | PRN
  Administered 2014-04-28 (×2): 4 mg via ORAL
  Filled 2014-04-27 (×2): qty 1

## 2014-04-27 MED ORDER — SODIUM CHLORIDE 0.9 % IV SOLN
Freq: Once | INTRAVENOUS | Status: AC
  Administered 2014-04-27: 18:00:00 via INTRAVENOUS

## 2014-04-27 NOTE — Progress Notes (Signed)
Pt c/o pain and continuous hiccups.  Surgical site assessed and there appears to be a small "lump" under skin.  NP notified and new orders placed. Syliva Overman

## 2014-04-27 NOTE — Progress Notes (Signed)
MD on call paged regarding drop in hemoglobin.  Will continue to monitor. Syliva Overman

## 2014-04-27 NOTE — Progress Notes (Signed)
Seen and agree  

## 2014-04-27 NOTE — Progress Notes (Signed)
Patient ID: Cheryl Holloway, female   DOB: 11/17/1975, 38 y.o.   MRN: [id number]     [facility]      [address] [place], [place] [address]    Phone: [phone] FAX: [phone]     Subjective: Pain well controlled.  Tolerating POs.  Had a BM yesterday.  Ambulating in hallways.  VSS.  Afebrile.  Post transfusion hgb 9.4--->8.3  Objective:  Vital signs:  Filed Vitals:   04/26/14 1453 04/26/14 1711 04/26/14 2213 04/27/14 0623  BP: 144/67 143/85 143/76 126/62  Pulse: 73 87 90 92  Temp: 97.7 F (36.5 C) 97.4 F (36.3 C) 97.4 F (36.3 C) 98.2 F (36.8 C)  TempSrc: Oral Oral Oral Oral  Resp:  16 17 17   Height: 5\' 5"  (1.651 m)     Weight: 158 lb (71.668 kg)     SpO2: 98% 100% 100% 99%    Last BM Date: 04/26/14  Intake/Output   Yesterday:  09/11 0701 - 09/12 0700 In: 642 [P.O.:442; I.V.:200] Out: 1595 [Urine:1400; Drains:195] This shift:  Total I/O In: 240 [P.O.:240] Out: 1100 [Urine:1100]   Physical Exam: General: Pt awake/alert/oriented x4 in no acute distress Chest: cta. No chest wall pain w good excursion Breasts: incisions are c/d/i, steri-strips on the left. Ecchymosis noted on the left.  3 drains with serosanguinous output.  CV:  Pulses intact.  Regular rhythm MS: Normal AROM mjr joints.  No obvious deformity Abdomen: Soft.  Nondistended.  Non tender. No evidence of peritonitis.  No incarcerated hernias. Ext:  SCDs BLE.  No mjr edema.  No cyanosis Skin: No petechiae / purpura   Problem List:   Active Problems:   S/P mastectomy    Results:   Labs: Results for orders placed during the hospital encounter of 04/25/14 (from the past 48 hour(s))  CBC     Status: Abnormal   Collection Time    04/25/14  3:17 PM      Result Value Ref Range   WBC 9.5  4.0 - 10.5 K/uL   RBC 2.63 (*) 3.87 - 5.11 MIL/uL   Hemoglobin 6.6 (*) 12.0 - 15.0 g/dL   Comment: REPEATED TO VERIFY     SPECIMEN CHECKED FOR CLOTS   DELTA CHECK NOTED     CRITICAL RESULT CALLED TO, READ BACK BY AND VERIFIED WITH:     A SPILLERS,RN 1626 04/25/14 WBOND   HCT 20.5 (*) 36.0 - 46.0 %   MCV 77.9 (*) 78.0 - 100.0 fL   MCH 25.1 (*) 26.0 - 34.0 pg   MCHC 32.2  30.0 - 36.0 g/dL   RDW 15.6 (*) 11.5 - 15.5 %   Platelets 166  150 - 400 K/uL   Comment: REPEATED TO VERIFY     SPECIMEN CHECKED FOR CLOTS     DELTA CHECK NOTED  POCT HEMOGLOBIN-HEMACUE     Status: Abnormal   Collection Time    04/25/14  3:23 PM      Result Value Ref Range   Hemoglobin 6.4 (*) 12.0 - 15.0 g/dL  TYPE AND SCREEN     Status: None   Collection Time    04/25/14  4:40 PM      Result Value Ref Range   ABO/RH(D) A POS     Antibody Screen NEG     Sample Expiration 04/28/2014     Unit Number [id number]     Blood Component Type RED CELLS,LR     Unit division 00  Status of Unit ISSUED,FINAL     Transfusion Status OK TO TRANSFUSE     Crossmatch Result Compatible     Unit Number [id number]     Blood Component Type RED CELLS,LR     Unit division 00     Status of Unit ISSUED,FINAL     Transfusion Status OK TO TRANSFUSE     Crossmatch Result Compatible    ABO/RH     Status: None   Collection Time    04/25/14  4:40 PM      Result Value Ref Range   ABO/RH(D) A POS    PREPARE RBC (CROSSMATCH)     Status: None   Collection Time    04/25/14  6:00 PM      Result Value Ref Range   Order Confirmation ORDER PROCESSED BY BLOOD BANK    MRSA PCR SCREENING     Status: None   Collection Time    04/25/14  6:55 PM      Result Value Ref Range   MRSA by PCR NEGATIVE  NEGATIVE   Comment:            The GeneXpert MRSA Assay (FDA     approved for NASAL specimens     only), is one component of a     comprehensive MRSA colonization     surveillance program. It is not     intended to diagnose MRSA     infection nor to guide or     monitor treatment for     MRSA infections.  CBC     Status: Abnormal   Collection Time    04/26/14  3:45 AM      Result  Value Ref Range   WBC 6.8  4.0 - 10.5 K/uL   RBC 3.39 (*) 3.87 - 5.11 MIL/uL   Hemoglobin 9.4 (*) 12.0 - 15.0 g/dL   Comment: POST TRANSFUSION SPECIMEN   HCT 27.5 (*) 36.0 - 46.0 %   MCV 81.1  78.0 - 100.0 fL   MCH 27.7  26.0 - 34.0 pg   MCHC 34.2  30.0 - 36.0 g/dL   RDW 15.2  11.5 - 15.5 %   Platelets 149 (*) 150 - 400 K/uL  CBC     Status: Abnormal   Collection Time    04/27/14  2:19 AM      Result Value Ref Range   WBC 5.6  4.0 - 10.5 K/uL   RBC 2.96 (*) 3.87 - 5.11 MIL/uL   Hemoglobin 8.3 (*) 12.0 - 15.0 g/dL   HCT 24.7 (*) 36.0 - 46.0 %   MCV 83.4  78.0 - 100.0 fL   MCH 28.0  26.0 - 34.0 pg   MCHC 33.6  30.0 - 36.0 g/dL   RDW 15.7 (*) 11.5 - 15.5 %   Platelets 131 (*) 150 - 400 K/uL    Imaging / Studies: No results found.  Medications / Allergies:  Scheduled Meds:  Continuous Infusions: . sodium chloride 50 mL/hr at 04/26/14 1521   PRN Meds:.acetaminophen, acetaminophen, diazepam, HYDROmorphone (DILAUDID) injection, ondansetron, oxyCODONE  Antibiotics: Anti-infectives   Start     Dose/Rate Route Frequency Ordered Stop   04/25/14 1400  ceFAZolin (ANCEF) IVPB 2 g/50 mL premix     2 g 100 mL/hr over 30 Minutes Intravenous 3 times per day 04/25/14 1244 04/26/14 0717   04/25/14 0928  polymyxin B 500,000 Units, bacitracin 50,000 Units in sodium chloride irrigation 0.9 % 500 mL irrigation  Status:  Discontinued       As needed 04/25/14 0948 04/25/14 1021   04/25/14 0609  ceFAZolin (ANCEF) IVPB 2 g/50 mL premix     2 g 100 mL/hr over 30 Minutes Intravenous On call to O.R. 04/25/14 3254 04/25/14 0732        Assessment/Plan POD#2 left mastectomy, right removal implant/capsulectomy -repeat CBC in AM to ensure it remains stable. If stable, DC in AM -pain controlled -mobilizing -tolerating POs -SCDs, no lovenox  -drain care teaching   Erby Pian, [facility] Pager [phone](7A-4:30P) For consults and floor pages call  [phone](7A-4:30P)  04/27/2014 9:47 AM

## 2014-04-28 LAB — CBC WITH DIFFERENTIAL/PLATELET
BASOS PCT: 1 % (ref 0–1)
Basophils Absolute: 0 10*3/uL (ref 0.0–0.1)
EOS ABS: 0 10*3/uL (ref 0.0–0.7)
Eosinophils Relative: 0 % (ref 0–5)
HEMATOCRIT: 27.2 % — AB (ref 36.0–46.0)
HEMOGLOBIN: 9.2 g/dL — AB (ref 12.0–15.0)
Lymphocytes Relative: 23 % (ref 12–46)
Lymphs Abs: 1.2 10*3/uL (ref 0.7–4.0)
MCH: 27.6 pg (ref 26.0–34.0)
MCHC: 33.8 g/dL (ref 30.0–36.0)
MCV: 81.7 fL (ref 78.0–100.0)
MONOS PCT: 9 % (ref 3–12)
Monocytes Absolute: 0.5 10*3/uL (ref 0.1–1.0)
Neutro Abs: 3.7 10*3/uL (ref 1.7–7.7)
Neutrophils Relative %: 67 % (ref 43–77)
Platelets: 216 10*3/uL (ref 150–400)
RBC: 3.33 MIL/uL — ABNORMAL LOW (ref 3.87–5.11)
RDW: 16.4 % — ABNORMAL HIGH (ref 11.5–15.5)
WBC: 5.5 10*3/uL (ref 4.0–10.5)

## 2014-04-28 LAB — TYPE AND SCREEN
ABO/RH(D): A POS
ANTIBODY SCREEN: NEGATIVE
UNIT DIVISION: 0
Unit division: 0
Unit division: 0

## 2014-04-28 MED ORDER — DIPHENHYDRAMINE HCL 50 MG/ML IJ SOLN
12.5000 mg | Freq: Four times a day (QID) | INTRAMUSCULAR | Status: DC | PRN
  Administered 2014-04-28 – 2014-04-29 (×2): 25 mg via INTRAVENOUS
  Filled 2014-04-28 (×2): qty 1

## 2014-04-28 MED ORDER — ACETAMINOPHEN 500 MG PO TABS
1000.0000 mg | ORAL_TABLET | Freq: Three times a day (TID) | ORAL | Status: DC
  Administered 2014-04-28 – 2014-04-29 (×4): 1000 mg via ORAL
  Filled 2014-04-28 (×7): qty 2

## 2014-04-28 MED ORDER — OXYCODONE HCL 5 MG PO TABS
10.0000 mg | ORAL_TABLET | ORAL | Status: DC | PRN
  Administered 2014-04-28: 10 mg via ORAL
  Administered 2014-04-28 (×2): 15 mg via ORAL
  Administered 2014-04-28: 10 mg via ORAL
  Administered 2014-04-29: 15 mg via ORAL
  Filled 2014-04-28: qty 2
  Filled 2014-04-28: qty 3
  Filled 2014-04-28: qty 2
  Filled 2014-04-28 (×2): qty 3

## 2014-04-28 NOTE — Progress Notes (Signed)
She does not appear to have an expanding hematoma on her chest wall but hg keeps drifting down. Will recheck and monitor

## 2014-04-28 NOTE — Progress Notes (Signed)
Patient ID: Cheryl Holloway, female   DOB: Jan 13, 1976, 38 y.o.   MRN: [id number]     [facility]      [address] [place], [place] [address]    Phone: [phone] FAX: [phone]     Subjective: Drop in h&h requiring transfusion.  VSS.  Asymptomatic.  Increase in pain today.  Objective:  Vital signs:  Filed Vitals:   04/27/14 1858 04/27/14 1859 04/27/14 2035 04/28/14 0640  BP: 147/85 147/85 149/85 139/70  Pulse: 93 93 100 88  Temp: 98.3 F (36.8 C) 98.3 F (36.8 C) 97.8 F (36.6 C) 98.6 F (37 C)  TempSrc: Oral Oral Oral Oral  Resp: 16 16 18 17   Height:      Weight:      SpO2: 100% 100% 100% 98%    Last BM Date: 04/27/14  Intake/Output   Yesterday:  09/12 0701 - 09/13 0700 In: 295 [P.O.:720; Blood:12] Out: 2870 [Urine:2600; Drains:270] This shift:    I/O last 3 completed shifts: In: 1884 [P.O.:1162; Blood:12] Out: 1660 [Urine:3200; Drains:395]    Physical Exam:  General: Pt awake/alert/oriented x4 in no acute distress  Chest: cta. No chest wall pain w good excursion  Breasts: incisions are c/d/i, steri-strips on the left. Ecchymosis noted on the left. 3 drains with serosanguinous output.  CV: Pulses intact. Regular rhythm  MS: Normal AROM mjr joints. No obvious deformity  Abdomen: Soft. Nondistended. Non tender. No evidence of peritonitis. No incarcerated hernias.  Ext: SCDs BLE. No mjr edema. No cyanosis  Skin: No petechiae / purpura  Drains Left lateral chest---83ml Left prox chest---167ml Right lateral chest---120ml  Problem List:   Active Problems:   S/P mastectomy    Results:   Labs: Results for orders placed during the hospital encounter of 04/25/14 (from the past 48 hour(s))  CBC     Status: Abnormal   Collection Time    04/27/14  2:19 AM      Result Value Ref Range   WBC 5.6  4.0 - 10.5 K/uL   RBC 2.96 (*) 3.87 - 5.11 MIL/uL   Hemoglobin 8.3 (*) 12.0 - 15.0 g/dL   HCT 24.7 (*) 36.0 -  46.0 %   MCV 83.4  78.0 - 100.0 fL   MCH 28.0  26.0 - 34.0 pg   MCHC 33.6  30.0 - 36.0 g/dL   RDW 15.7 (*) 11.5 - 15.5 %   Platelets 131 (*) 150 - 400 K/uL  CBC     Status: Abnormal   Collection Time    04/27/14  3:45 PM      Result Value Ref Range   WBC 5.4  4.0 - 10.5 K/uL   RBC 2.68 (*) 3.87 - 5.11 MIL/uL   Hemoglobin 7.3 (*) 12.0 - 15.0 g/dL   HCT 21.9 (*) 36.0 - 46.0 %   MCV 81.7  78.0 - 100.0 fL   MCH 27.2  26.0 - 34.0 pg   MCHC 33.3  30.0 - 36.0 g/dL   RDW 16.1 (*) 11.5 - 15.5 %   Platelets 155  150 - 400 K/uL  PREPARE RBC (CROSSMATCH)     Status: None   Collection Time    04/27/14  5:30 PM      Result Value Ref Range   Order Confirmation ORDER PROCESSED BY BLOOD BANK      Imaging / Studies: No results found.  Medications / Allergies:  Scheduled Meds:  Continuous Infusions:  PRN Meds:.acetaminophen, acetaminophen,  diazepam, HYDROmorphone (DILAUDID) injection, ondansetron, oxyCODONE  Antibiotics: Anti-infectives   Start     Dose/Rate Route Frequency Ordered Stop   04/25/14 1400  ceFAZolin (ANCEF) IVPB 2 g/50 mL premix     2 g 100 mL/hr over 30 Minutes Intravenous 3 times per day 04/25/14 1244 04/26/14 0717   04/25/14 0928  polymyxin B 500,000 Units, bacitracin 50,000 Units in sodium chloride irrigation 0.9 % 500 mL irrigation  Status:  Discontinued       As needed 04/25/14 0948 04/25/14 1021   04/25/14 0609  ceFAZolin (ANCEF) IVPB 2 g/50 mL premix     2 g 100 mL/hr over 30 Minutes Intravenous On call to O.R. 04/25/14 7711 04/25/14 0732      Assessment/Plan  POD#3 left mastectomy, right removal implant/capsulectomy  ABL anemia -hgb down to 7.3, repeat CBC now.  If stable, repeat in AM.  Incisions are intact, there is ecchymosis noted on the left, but no increase in size. -increase oxycodone, add scheduled tylenol -mobilizing  -tolerating POs  -SCDs, no lovenox  -drain care teaching    Erby Pian, [facility] Pager  [phone](7A-4:30P) For consults and floor pages call [phone](7A-4:30P)  04/28/2014 9:31 AM

## 2014-04-29 LAB — CBC
HCT: 25.5 % — ABNORMAL LOW (ref 36.0–46.0)
Hemoglobin: 8.3 g/dL — ABNORMAL LOW (ref 12.0–15.0)
MCH: 26.7 pg (ref 26.0–34.0)
MCHC: 32.5 g/dL (ref 30.0–36.0)
MCV: 82 fL (ref 78.0–100.0)
Platelets: 223 10*3/uL (ref 150–400)
RBC: 3.11 MIL/uL — ABNORMAL LOW (ref 3.87–5.11)
RDW: 16.2 % — AB (ref 11.5–15.5)
WBC: 4.9 10*3/uL (ref 4.0–10.5)

## 2014-04-29 MED ORDER — OXYCODONE HCL 10 MG PO TABS: 10.0000 mg | ORAL_TABLET | ORAL | Status: DC | PRN

## 2014-04-29 MED ORDER — DIAZEPAM 2 MG PO TABS: 2.0000 mg | ORAL_TABLET | Freq: Three times a day (TID) | ORAL | Status: DC | PRN

## 2014-04-29 NOTE — Progress Notes (Signed)
4 Days Post-Op  Subjective: Feels fine  Objective: Vital signs in last 24 hours: Temp:  [98 F (36.7 C)-98.5 F (36.9 C)] 98.4 F (36.9 C) (09/14 0512) Pulse Rate:  [75-87] 76 (09/14 0512) Resp:  [17-18] 17 (09/14 0512) BP: (138-149)/(76-89) 149/89 mmHg (09/14 0512) SpO2:  [100 %] 100 % (09/14 0512) Last BM Date: 04/28/14  Intake/Output from previous day: 09/13 0701 - 09/14 0700 In: 1160 [P.O.:1160] Out: 162 [Drains:162] Intake/Output this shift:    General appearance: no distress Incision/Wound:drains with serosang fluid, they are thinner, some ecchymosis in bilateral axilla but resolving, no hematoma on chest wall  Lab Results:   Recent Labs  04/28/14 1105 04/29/14 0555  WBC 5.5 4.9  HGB 9.2* 8.3*  HCT 27.2* 25.5*  PLT 216 223   BMET No results found for this basename: NA, K, CL, CO2, GLUCOSE, BUN, CREATININE, CALCIUM,  in the last 72 hours PT/INR No results found for this basename: LABPROT, INR,  in the last 72 hours ABG No results found for this basename: PHART, PCO2, PO2, HCO3,  in the last 72 hours  Studies/Results: No results found.  Anti-infectives: Anti-infectives   Start     Dose/Rate Route Frequency Ordered Stop   04/25/14 1400  ceFAZolin (ANCEF) IVPB 2 g/50 mL premix     2 g 100 mL/hr over 30 Minutes Intravenous 3 times per day 04/25/14 1244 04/26/14 0717   04/25/14 0928  polymyxin B 500,000 Units, bacitracin 50,000 Units in sodium chloride irrigation 0.9 % 500 mL irrigation  Status:  Discontinued       As needed 04/25/14 0948 04/25/14 1021   04/25/14 0609  ceFAZolin (ANCEF) IVPB 2 g/50 mL premix     2 g 100 mL/hr over 30 Minutes Intravenous On call to O.R. 04/25/14 0609 04/25/14 0732      Assessment/Plan: POD 4 left mastectomy/right implant removal/capsulectomy  No hematoma, drains thinner, hct appropriate, she has no evidence of active bleeding, will dc home today with follow up in one week  [facility] 04/29/2014

## 2014-04-29 NOTE — Discharge Summary (Signed)
Physician Discharge Summary  Patient ID: Delanda Bulluck MRN: [id number] DOB/AGE: [birth year] 38 y.o.  Admit date: 04/25/2014 Discharge date: 04/29/2014  Admission Diagnoses: Breast cancer  Discharge Diagnoses:  Active Problems:   S/P mastectomy  Discharged Condition: good  Hospital Course: 18 yof who had prior breast cancer on right side and ruptured implant.  She also wanted a prophylactic mastectomy on the left side.  She underwent procedure with Dr Migdalia Dk on right and I did a left mastectomy. She had a lot of scar tissue and had some bleeding that required a transfusion.  This has stopped and she is doing well. She will be discharged home with her drains.  Consults: none  Significant Diagnostic Studies: none  Treatments: surgery: left mastectomy, right implant removal  Disposition: 01-Home or Self Care     Medication List         ADVIL PM 200-25 MG Caps  Generic drug:  Ibuprofen-Diphenhydramine HCl  Take 1 capsule by mouth at bedtime as needed.     diazepam 2 MG tablet  Commonly known as:  VALIUM  Take 1 tablet (2 mg total) by mouth every 8 (eight) hours as needed for muscle spasms.     fish oil-omega-3 fatty acids 1000 MG capsule  Take 1 g by mouth daily.     IRON PO  Take 1 tablet by mouth daily.     multivitamin with minerals Tabs tablet  Take 1 tablet by mouth daily.     OVER THE COUNTER MEDICATION  1 tablet daily of  Potassium     Oxycodone HCl 10 MG Tabs  Take 1-1.5 tablets (10-15 mg total) by mouth every 4 (four) hours as needed for moderate pain.     vitamin B-12 500 MCG tablet  Commonly known as:  CYANOCOBALAMIN  Take 500 mcg by mouth daily.     VITAMIN E PO  Take 400 Units by mouth daily.        SignedRolm Bookbinder 04/29/2014, 3:14 PM

## 2014-04-29 NOTE — Progress Notes (Signed)
Discharge instructions reviewed with pt and pt's mother. Taught pt how to empty JP drains, handout given. Importance of frequent emptying of drains reviewed. Pt demonstrated how to empty JP drains. Pt reports understanding of discharge instructions. Rx's given to pt. Pt waiting on ride home. Pt ready for discharge.

## 2014-05-02 NOTE — Progress Notes (Signed)
Agree with the above

## 2014-05-03 DIAGNOSIS — Z9886 Personal history of breast implant removal: Secondary | ICD-10-CM

## 2014-05-06 ENCOUNTER — Other Ambulatory Visit (INDEPENDENT_AMBULATORY_CARE_PROVIDER_SITE_OTHER): Payer: Self-pay

## 2014-05-06 ENCOUNTER — Other Ambulatory Visit (INDEPENDENT_AMBULATORY_CARE_PROVIDER_SITE_OTHER): Payer: Self-pay | Admitting: General Surgery

## 2014-05-06 DIAGNOSIS — C50911 Malignant neoplasm of unspecified site of right female breast: Secondary | ICD-10-CM

## 2014-05-06 LAB — CBC
HCT: 27.7 % — ABNORMAL LOW (ref 36.0–46.0)
Hemoglobin: 8.8 g/dL — ABNORMAL LOW (ref 12.0–15.0)
MCH: 27.5 pg (ref 26.0–34.0)
MCHC: 31.8 g/dL (ref 30.0–36.0)
MCV: 86.6 fL (ref 78.0–100.0)
Platelets: 533 10*3/uL — ABNORMAL HIGH (ref 150–400)
RBC: 3.2 MIL/uL — ABNORMAL LOW (ref 3.87–5.11)
RDW: 15.7 % — AB (ref 11.5–15.5)
WBC: 7.6 10*3/uL (ref 4.0–10.5)

## 2014-05-06 LAB — COMPREHENSIVE METABOLIC PANEL
ALK PHOS: 88 U/L (ref 39–117)
ALT: 17 U/L (ref 0–35)
AST: 25 U/L (ref 0–37)
Albumin: 2.4 g/dL — ABNORMAL LOW (ref 3.5–5.2)
BILIRUBIN TOTAL: 0.4 mg/dL (ref 0.2–1.2)
BUN: 6 mg/dL (ref 6–23)
CO2: 28 mEq/L (ref 19–32)
CREATININE: 0.67 mg/dL (ref 0.50–1.10)
Calcium: 8.3 mg/dL — ABNORMAL LOW (ref 8.4–10.5)
Chloride: 106 mEq/L (ref 96–112)
Glucose, Bld: 99 mg/dL (ref 70–99)
Potassium: 4.8 mEq/L (ref 3.5–5.3)
Sodium: 139 mEq/L (ref 135–145)
Total Protein: 5.1 g/dL — ABNORMAL LOW (ref 6.0–8.3)

## 2014-05-11 ENCOUNTER — Other Ambulatory Visit: Payer: Self-pay | Admitting: Oncology

## 2014-05-11 DIAGNOSIS — C50911 Malignant neoplasm of unspecified site of right female breast: Secondary | ICD-10-CM

## 2014-05-11 DIAGNOSIS — D649 Anemia, unspecified: Secondary | ICD-10-CM

## 2014-05-15 ENCOUNTER — Telehealth: Payer: Self-pay | Admitting: Oncology

## 2014-05-15 ENCOUNTER — Ambulatory Visit (HOSPITAL_BASED_OUTPATIENT_CLINIC_OR_DEPARTMENT_OTHER): Payer: 59 | Admitting: Oncology

## 2014-05-15 ENCOUNTER — Other Ambulatory Visit (HOSPITAL_BASED_OUTPATIENT_CLINIC_OR_DEPARTMENT_OTHER): Payer: 59

## 2014-05-15 ENCOUNTER — Encounter: Payer: Self-pay | Admitting: Oncology

## 2014-05-15 VITALS — BP 128/81 | HR 68 | Temp 98.5°F | Resp 20 | Ht 65.0 in | Wt 153.7 lb

## 2014-05-15 DIAGNOSIS — D649 Anemia, unspecified: Secondary | ICD-10-CM

## 2014-05-15 DIAGNOSIS — C50911 Malignant neoplasm of unspecified site of right female breast: Secondary | ICD-10-CM

## 2014-05-15 DIAGNOSIS — Z170 Estrogen receptor positive status [ER+]: Secondary | ICD-10-CM

## 2014-05-15 DIAGNOSIS — R630 Anorexia: Secondary | ICD-10-CM

## 2014-05-15 DIAGNOSIS — Z23 Encounter for immunization: Secondary | ICD-10-CM

## 2014-05-15 DIAGNOSIS — R634 Abnormal weight loss: Secondary | ICD-10-CM

## 2014-05-15 DIAGNOSIS — C50919 Malignant neoplasm of unspecified site of unspecified female breast: Secondary | ICD-10-CM

## 2014-05-15 LAB — COMPREHENSIVE METABOLIC PANEL (CC13)
ALT: 18 U/L (ref 0–55)
AST: 31 U/L (ref 5–34)
Albumin: 2.2 g/dL — ABNORMAL LOW (ref 3.5–5.0)
Alkaline Phosphatase: 100 U/L (ref 40–150)
Anion Gap: 6 mEq/L (ref 3–11)
BUN: 6.8 mg/dL — AB (ref 7.0–26.0)
CHLORIDE: 110 meq/L — AB (ref 98–109)
CO2: 27 mEq/L (ref 22–29)
CREATININE: 0.8 mg/dL (ref 0.6–1.1)
Calcium: 7.8 mg/dL — ABNORMAL LOW (ref 8.4–10.4)
Glucose: 121 mg/dl (ref 70–140)
Potassium: 3.9 mEq/L (ref 3.5–5.1)
Sodium: 142 mEq/L (ref 136–145)
Total Bilirubin: 0.39 mg/dL (ref 0.20–1.20)
Total Protein: 5.6 g/dL — ABNORMAL LOW (ref 6.4–8.3)

## 2014-05-15 LAB — PROTIME-INR
INR: 1.1 — AB (ref 2.00–3.50)
Protime: 13.2 Seconds (ref 10.6–13.4)

## 2014-05-15 LAB — CBC & DIFF AND RETIC
BASO%: 0.4 % (ref 0.0–2.0)
Basophils Absolute: 0 10*3/uL (ref 0.0–0.1)
EOS%: 0.6 % (ref 0.0–7.0)
Eosinophils Absolute: 0 10*3/uL (ref 0.0–0.5)
HCT: 28.7 % — ABNORMAL LOW (ref 34.8–46.6)
HEMOGLOBIN: 9.3 g/dL — AB (ref 11.6–15.9)
IMMATURE RETIC FRACT: 5.7 % (ref 1.60–10.00)
LYMPH#: 1.5 10*3/uL (ref 0.9–3.3)
LYMPH%: 29.5 % (ref 14.0–49.7)
MCH: 27 pg (ref 25.1–34.0)
MCHC: 32.4 g/dL (ref 31.5–36.0)
MCV: 83.4 fL (ref 79.5–101.0)
MONO#: 0.4 10*3/uL (ref 0.1–0.9)
MONO%: 7 % (ref 0.0–14.0)
NEUT#: 3.2 10*3/uL (ref 1.5–6.5)
NEUT%: 62.5 % (ref 38.4–76.8)
Platelets: 483 10*3/uL — ABNORMAL HIGH (ref 145–400)
RBC: 3.44 10*6/uL — ABNORMAL LOW (ref 3.70–5.45)
RDW: 16.5 % — AB (ref 11.2–14.5)
RETIC CT ABS: 55.38 10*3/uL (ref 33.70–90.70)
Retic %: 1.61 % (ref 0.70–2.10)
WBC: 5.2 10*3/uL (ref 3.9–10.3)

## 2014-05-15 MED ORDER — INFLUENZA VAC SPLIT QUAD 0.5 ML IM SUSY
0.5000 mL | PREFILLED_SYRINGE | Freq: Once | INTRAMUSCULAR | Status: AC
  Administered 2014-05-15: 0.5 mL via INTRAMUSCULAR
  Filled 2014-05-15: qty 0.5

## 2014-05-15 MED ORDER — TAMOXIFEN CITRATE 20 MG PO TABS: 20.0000 mg | ORAL_TABLET | Freq: Every day | ORAL | Status: DC

## 2014-05-15 NOTE — Telephone Encounter (Signed)
per pof to sch pt appt-gave pt copy of sch °

## 2014-05-15 NOTE — Progress Notes (Signed)
OFFICE PROGRESS NOTE   05/15/2014   Physicians:K.Turner Daniels Sanger, Rolm Bookbinder, Wilford Corner (new patient 05-31-14)   INTERVAL HISTORY:   Patient is seen, alone for visit, in follow up of history of right breast cancer, anemia and other concerns.   She has now had the ruptured right breast implant removed and prophylactic left mastectomy by Drs Migdalia Dk and Donne Hazel on 04-25-14. All pathology was benign; she did require PRBCs total 2 units on 9-10 and 04-27-14, for hemoglobin down to 7.3. She is still uncomfortable at left mastectomy site, however the right sided discomfort related to the implant has resolved entirely, and she is delighted with this. Dr Donne Hazel plans some PT upcoming.  She is scheduled as new patient to Dr Michail Sermon on 05-31-14, as diarrhea only briefly improved off of fish oil and is happening again 4-5x daily now. Note she did have one positive hemoccult 03-28-14. Appetite is good, no upper GI symptoms and no noted blood.  She would like to resume tamoxifen, which she used adjuvantly for the right breast cancer from 2011 thru 2013, DCd then due to lack of insurance. She recalls significant hot flashes with tamoxifen previously.   She does not have central catheter. She is given flu vaccine today.  ONCOLOGIC HISTORY Patient found mass in right breast adjacent to nipple in 04-2009 at age 2. This was 1.8 cm moderately differentiated invasive ductal carcinoma with micrometastais<48m in 1/3 sentinel nodes, associated high grade DCIS, surgical margins widely negative, ER+ PR +, HER 2 negative by FISH, angiolymphatic invasion present at right mastectomy with 3 sentinel nodes ([phone] [facility]-27-2009, [facility]. Path staging was [id number])pMx. [place] onc[place] Ja[place] Net[place]radiation oncologist Essam ShLucy ChrisShe had adjuvant  taxotere cytoxan x 4 cycles from Dec 2010 thru 09-26-2009, then tamoxifen from 2011 until "2 years ago", so ~ 2013 when she stopped this due to lack of insurance. She recalls that she tolerated tamoxifen without difficulty, apparently was on Effexor for hot flashes then. She also may have been on zometa q 6 months per outside records, but does not recall this. She tells me that she was BRCA 1 and 2 negative (not in records received). Oncotype Dx from 07-2009 had breast cancer recurrence score of 13.  She had implant on right, with significant discomfort there requiring total of 3 [place] had continuous discomfort described as clawing or pulling under the implant for at least 2 years. In the few notes available from previous physicians, she used percocet for this discomfort. MRI breast bilateral 01-25-14 showed probable intracapsular rupture of right silicone implant and no evidence of malignancy or adenopathy bilaterally.   Review of systems as above, also: No fever or symptoms of infection, including no dysuria. No other pain. No increased SOB. Remainder of 10 point Review of Systems negative.  Objective:  Vital signs in last 24 hours:  BP 128/81  Pulse 68  Temp(Src) 98.5 F (36.9 C) (Oral)  Resp 20  Ht 5' 5"  (1.651 m)  Wt 153 lb 11.2 oz (69.718 kg)  BMI 25.58 kg/m2 weight is down 2 lbs.  Alert, oriented and appropriate. Ambulatory without difficulty. Looks mildly uncomfortable, especially changing positions on exam table.   HEENT:PERRL, sclerae not icteric. Oral mucosa moist without lesions, posterior pharynx clear. Mucous membranes somewhat pale. Neck supple. No JVD.  Lymphatics:no cervical,suraclavicular, axillary or inguinal adenopathy Resp: clear to  auscultation bilaterally and normal percussion bilaterally Cardio: regular rate and rhythm. No gallop. GI: soft, nontender, not distended, no mass or organomegaly. Normally active bowel sounds. Surgical  incision not remarkable. Musculoskeletal/ Extremities: without pitting edema, cords, tenderness Neuro: no peripheral neuropathy. Otherwise nonfocal Skin without rash, ecchymosis, petechiae. Nailbeds also pale Breasts: bilateral mastectomy scars appear to be healing well, steristrips still on left. Axillae benign.   Lab Results:  Results for orders placed in visit on 05/15/14  COMPREHENSIVE METABOLIC PANEL (MC80)      Result Value Ref Range   Sodium 142  136 - 145 mEq/L   Potassium 3.9  3.5 - 5.1 mEq/L   Chloride 110 (*) 98 - 109 mEq/L   CO2 27  22 - 29 mEq/L   Glucose 121  70 - 140 mg/dl   BUN 6.8 (*) 7.0 - 26.0 mg/dL   Creatinine 0.8  0.6 - 1.1 mg/dL   Total Bilirubin 0.39  0.20 - 1.20 mg/dL   Alkaline Phosphatase 100  40 - 150 U/L   AST 31  5 - 34 U/L   ALT 18  0 - 55 U/L   Total Protein 5.6 (*) 6.4 - 8.3 g/dL   Albumin 2.2 (*) 3.5 - 5.0 g/dL   Calcium 7.8 (*) 8.4 - 10.4 mg/dL   Anion Gap 6  3 - 11 mEq/L  PROTIME-INR      Result Value Ref Range   Protime 13.2  10.6 - 13.4 Seconds   INR 1.10 (*) 2.00 - 3.50   Lovenox No    CBC & DIFF AND RETIC      Result Value Ref Range   WBC 5.2  3.9 - 10.3 10e3/uL   NEUT# 3.2  1.5 - 6.5 10e3/uL   HGB 9.3 (*) 11.6 - 15.9 g/dL   HCT 28.7 (*) 34.8 - 46.6 %   Platelets 483 (*) 145 - 400 10e3/uL   MCV 83.4  79.5 - 101.0 fL   MCH 27.0  25.1 - 34.0 pg   MCHC 32.4  31.5 - 36.0 g/dL   RBC 3.44 (*) 3.70 - 5.45 10e6/uL   RDW 16.5 (*) 11.2 - 14.5 %   lymph# 1.5  0.9 - 3.3 10e3/uL   MONO# 0.4  0.1 - 0.9 10e3/uL   Eosinophils Absolute 0.0  0.0 - 0.5 10e3/uL   Basophils Absolute 0.0  0.0 - 0.1 10e3/uL   NEUT% 62.5  38.4 - 76.8 %   LYMPH% 29.5  14.0 - 49.7 %   MONO% 7.0  0.0 - 14.0 %   EOS% 0.6  0.0 - 7.0 %   BASO% 0.4  0.0 - 2.0 %   Retic % 1.61  0.70 - 2.10 %   Retic Ct Abs 55.38  33.70 - 90.70 10e3/uL   Immature Retic Fract 5.70  1.60 - 10.00 %    Anemia evaluation: Normal hemoglobin electrophoresis 03-2014 B12 741 Ferritin 95 Serum  iron 103, %sat 63   Studies/Results: PATHOLOGY Martin Majestic Collected: 04/25/2014 Client: [place] Accession: [id number] Received: 04/25/2014 Jerelyn Charles PATHOLOGY FINAL DIAGNOSIS Diagnosis 1. Breast, simple mastectomy, Left - BENIGN BREAST PARENCHYMA. - NO ATYPIA, HYPERPLASIA, OR MALIGNANCY IDENTIFIED. - GROSSLY UNREMARKABLE SKIN AND NIPPLE. 2. Breast, implant only, Right - GROSS DIAGNOSIS ONLY: BREAST IMPLANT, CLINICALLY RIGHT, 704 GRAMS. 3. Breast, capsule, Right - BENIGN FIBROUS TISSUE CONSISTENT WITH CAPSULAR SOFT TISSUE RESPONSE. - NO ATYPIA OR TUMOR SEEN. 4. Skin , Right breast - BENIGN SKIN WITH UNDERLYING BENIGN FIBROFATTY SOFT TISSUE. -  NO DYSPLASIA, ATYPIA OR MALIGNANCY IDENTIFIED.  Medications: I have reviewed the patient's current medications. Will resume tamoxifen 20 mg daily within next week or so.  DISCUSSION: She will keep appointment with Dr Michail Sermon 05-31-14 for diarrhea and heme + stool x1. She may need bone marrow exam depending on rest of evaluation; note had adjuvant chemotherapy 2010  Assessment/Plan: 1.T1N27m M0 right breast cancer diagnosed 06-2009 at age 38 ER PR +, HER 2-, BRCA reportedly negative. Treated with right mastectomy with 3 sentinel nodes, adjuvant taxotere, carboplatin x 4 thru 09-2009 and ~ 2 years of tamoxifen thru 2013. [facility] Now post prophylactic left mastectomy 04-25-14. Will resume tamoxifen and I will see her back in ~ 6 weeks. 2.chronic discomfort right breast since silicone implant, which was also ruptured. Right sided discomfort much better already! 3.recent weight loss unclear etiology with frequent bowel movements and intermittent anorexia. HIV negative per Dr HLysle Rubens Appreciate Dr SKathline Magicassistance upcoming. 4.post gastric bypass 2009, at which time she weighed 230 lbs  5.anemia since 2010: B12 and iron normal. Transfused 2 units PRBCs after surgery, note  that she had significant bleeding from surgical sites. INR ok today.  Has not had bone marrow exam. Not more symptomatic.  6.previously elevated LFTs which may be related to fatty liver, hepatitis negative per Dr HLysle Rubens     All questions answered and she is in agreement with plans above.  Cc this note Dr SMichail Sermonalso.    Shooter Tangen P, MD   05/15/2014, 3:04 PM

## 2014-05-20 ENCOUNTER — Telehealth: Payer: Self-pay | Admitting: *Deleted

## 2014-05-20 NOTE — Telephone Encounter (Signed)
Received Dr. Cristal Generous report on patient. Original to HIM and copy to Dr. Marko Plume to review.

## 2014-05-21 ENCOUNTER — Ambulatory Visit: Payer: 59 | Attending: Internal Medicine | Admitting: Physical Therapy

## 2014-05-21 DIAGNOSIS — Z5189 Encounter for other specified aftercare: Secondary | ICD-10-CM | POA: Insufficient documentation

## 2014-05-21 DIAGNOSIS — M25512 Pain in left shoulder: Secondary | ICD-10-CM | POA: Insufficient documentation

## 2014-05-21 DIAGNOSIS — Z9012 Acquired absence of left breast and nipple: Secondary | ICD-10-CM | POA: Diagnosis not present

## 2014-05-21 DIAGNOSIS — Z79899 Other long term (current) drug therapy: Secondary | ICD-10-CM | POA: Insufficient documentation

## 2014-05-21 DIAGNOSIS — M25619 Stiffness of unspecified shoulder, not elsewhere classified: Secondary | ICD-10-CM | POA: Insufficient documentation

## 2014-05-29 ENCOUNTER — Ambulatory Visit: Payer: 59 | Admitting: Physical Therapy

## 2014-05-29 DIAGNOSIS — Z5189 Encounter for other specified aftercare: Secondary | ICD-10-CM | POA: Diagnosis not present

## 2014-06-08 ENCOUNTER — Encounter (HOSPITAL_COMMUNITY): Payer: Self-pay | Admitting: Emergency Medicine

## 2014-06-08 ENCOUNTER — Emergency Department (HOSPITAL_COMMUNITY)
Admission: EM | Admit: 2014-06-08 | Discharge: 2014-06-08 | Disposition: A | Discharge status: Home / Self Care | Payer: 59 | Attending: Emergency Medicine | Admitting: Emergency Medicine

## 2014-06-08 DIAGNOSIS — Z9851 Tubal ligation status: Secondary | ICD-10-CM | POA: Insufficient documentation

## 2014-06-08 DIAGNOSIS — Z79899 Other long term (current) drug therapy: Secondary | ICD-10-CM | POA: Insufficient documentation

## 2014-06-08 DIAGNOSIS — D649 Anemia, unspecified: Secondary | ICD-10-CM | POA: Insufficient documentation

## 2014-06-08 DIAGNOSIS — Z79818 Long term (current) use of other agents affecting estrogen receptors and estrogen levels: Secondary | ICD-10-CM | POA: Diagnosis not present

## 2014-06-08 DIAGNOSIS — Z9884 Bariatric surgery status: Secondary | ICD-10-CM | POA: Diagnosis not present

## 2014-06-08 DIAGNOSIS — R6883 Chills (without fever): Secondary | ICD-10-CM | POA: Diagnosis not present

## 2014-06-08 DIAGNOSIS — Z9013 Acquired absence of bilateral breasts and nipples: Secondary | ICD-10-CM | POA: Insufficient documentation

## 2014-06-08 DIAGNOSIS — Z853 Personal history of malignant neoplasm of breast: Secondary | ICD-10-CM | POA: Diagnosis not present

## 2014-06-08 DIAGNOSIS — T733XXA Exhaustion due to excessive exertion, initial encounter: Secondary | ICD-10-CM | POA: Diagnosis not present

## 2014-06-08 DIAGNOSIS — R531 Weakness: Secondary | ICD-10-CM | POA: Diagnosis present

## 2014-06-08 HISTORY — DX: Benign neoplasm of connective and other soft tissue, unspecified: D21.9

## 2014-06-08 LAB — CBC
HCT: 30.7 % — ABNORMAL LOW (ref 36.0–46.0)
Hemoglobin: 10.4 g/dL — ABNORMAL LOW (ref 12.0–15.0)
MCH: 26.9 pg (ref 26.0–34.0)
MCHC: 33.9 g/dL (ref 30.0–36.0)
MCV: 79.5 fL (ref 78.0–100.0)
Platelets: 331 10*3/uL (ref 150–400)
RBC: 3.86 MIL/uL — ABNORMAL LOW (ref 3.87–5.11)
RDW: 15.5 % (ref 11.5–15.5)
WBC: 3.4 10*3/uL — ABNORMAL LOW (ref 4.0–10.5)

## 2014-06-08 LAB — POC OCCULT BLOOD, ED: FECAL OCCULT BLD: NEGATIVE

## 2014-06-08 NOTE — ED Provider Notes (Signed)
I saw and evaluated the patient, reviewed the resident's note and I agree with the findings and plan.   EKG Interpretation None       Leota Jacobsen, MD 06/08/14 1536

## 2014-06-08 NOTE — ED Provider Notes (Signed)
I saw and evaluated the patient, reviewed the resident's note and I agree with the findings and plan.   EKG Interpretation None     Patient complain of feeling weak x4 days. Is concerned that she might have worsening anemia. Denies any rectal bleeding but has had heavy menstrual periods. Recently returned to work after having a mastectomy done a month ago. Patient's hemoglobin is stable here. She will be discharged  Cheryl Jacobsen, MD 06/08/14 [phone]

## 2014-06-08 NOTE — Discharge Instructions (Signed)
See your OB doctor Come back if passing out, worsening chest pain, shortness of breath or fevers Follow up with PCP as needed

## 2014-06-08 NOTE — ED Provider Notes (Addendum)
CSN: [id number]     Arrival date & time 06/08/14  0801 History   First MD Initiated Contact with Patient 06/08/14 [phone]     Chief Complaint  Patient presents with  . Weakness    chills     (Consider location/radiation/quality/duration/timing/severity/associated sxs/prior Treatment) Patient is a 38 y.o. female presenting with weakness. The history is provided by the patient. No language interpreter was used.  Weakness This is a new problem. The current episode started in the past 7 days. The problem occurs constantly. The problem has been unchanged. Associated symptoms include weakness (fatigue). Pertinent negatives include no abdominal pain, chest pain, congestion, coughing, fever, headaches, myalgias, nausea, numbness, rash, sore throat, urinary symptoms or vomiting. Exacerbated by: heavy periods. She has tried nothing for the symptoms. The treatment provided no relief.    Past Medical History  Diagnosis Date  . Breast cancer   . Anemia     takes iron   Past Surgical History  Procedure Laterality Date  . Breast reconstruction    . Breast reduction surgery    . Gastric bypass    . Tubal ligation    . Cesarean section      x3  . Mastectomy      right  . Simple mastectomy with axillary sentinel node biopsy Left 04/25/2014    Procedure: LEFT TOTAL  MASTECTOMY (PROPHYLACTIC);  Surgeon: Rolm Bookbinder, MD;  Location: [place];  Service: General;  Laterality: Left;  . Breast implant removal Right 04/25/2014    Procedure: REMOVAL BREAST IMPLANT WITH CAPSULECTOMY;  Surgeon: Theodoro Kos, DO;  Location: [place];  Service: Plastics;  Laterality: Right;   No family history on file. History  Substance Use Topics  . Smoking status: Never Smoker   . Smokeless tobacco: Not on file  . Alcohol Use: Yes     Comment: social   OB History   Grav Para Term Preterm Abortions TAB SAB Ect Mult Living                 Review of Systems  Constitutional:  Negative for fever.  HENT: Negative for congestion, rhinorrhea and sore throat.   Respiratory: Negative for cough and shortness of breath.   Cardiovascular: Negative for chest pain.  Gastrointestinal: Negative for nausea, vomiting, abdominal pain, diarrhea and blood in stool.  Genitourinary: Negative for dysuria and hematuria.  Musculoskeletal: Negative for myalgias.  Skin: Negative for rash.  Neurological: Positive for weakness (fatigue). Negative for syncope, light-headedness, numbness and headaches.  All other systems reviewed and are negative.     Allergies  Review of patient's allergies indicates no known allergies.  Home Medications   Prior to Admission medications   Medication Sig Start Date End Date Taking? Authorizing Provider  diazepam (VALIUM) 2 MG tablet Take 1 tablet (2 mg total) by mouth every 8 (eight) hours as needed for muscle spasms. 04/29/14   Rolm Bookbinder, MD  fish oil-omega-3 fatty acids 1000 MG capsule Take 1 g by mouth daily.    Historical Provider, MD  HYDROcodone-acetaminophen (NORCO) 5-325 MG per tablet Take 1 tablet by mouth. 05/03/14   Historical Provider, MD  Ibuprofen-Diphenhydramine HCl (ADVIL PM) 200-25 MG CAPS Take 1 capsule by mouth at bedtime as needed.    Historical Provider, MD  IRON PO Take 1 tablet by mouth daily.    Historical Provider, MD  Multiple Vitamin (MULTIVITAMIN WITH MINERALS) TABS Take 1 tablet by mouth daily.    Historical Provider, MD  OVER THE  COUNTER MEDICATION 1 tablet daily of  Potassium    Historical Provider, MD  oxyCODONE 10 MG TABS Take 1-1.5 tablets (10-15 mg total) by mouth every 4 (four) hours as needed for moderate pain. 04/29/14   Rolm Bookbinder, MD  tamoxifen (NOLVADEX) 20 MG tablet Take 1 tablet (20 mg total) by mouth daily. 05/15/14   Lennis Marion Downer, MD  vitamin B-12 (CYANOCOBALAMIN) 500 MCG tablet Take 500 mcg by mouth daily.    Historical Provider, MD  VITAMIN E PO Take 400 Units by mouth daily.     Historical  Provider, MD   BP 135/105  Pulse 92  Temp(Src) 98.4 F (36.9 C) (Oral)  Resp 16  Ht 5\' 5"  (1.651 m)  Wt 150 lb (68.04 kg)  BMI 24.96 kg/m2  SpO2 100%  LMP 06/01/2014 Physical Exam  Nursing note and vitals reviewed. Constitutional: She is oriented to person, place, and time. She appears well-developed and well-nourished.  HENT:  Head: Normocephalic and atraumatic.  Right Ear: External ear normal.  Left Ear: External ear normal.  Eyes: EOM are normal.  Neck: Normal range of motion. Neck supple.  Cardiovascular: Normal rate, regular rhythm and intact distal pulses.  Exam reveals no gallop and no friction rub.   No murmur heard. Pulmonary/Chest: Effort normal and breath sounds normal. No respiratory distress. She has no wheezes. She has no rales. She exhibits no tenderness.  Abdominal: Soft. Bowel sounds are normal. She exhibits no distension. There is no tenderness. There is no rebound.  Genitourinary: Guaiac negative stool.  No gross blood on rectal exam  Musculoskeletal: Normal range of motion. She exhibits no edema and no tenderness.  Lymphadenopathy:    She has no cervical adenopathy.  Neurological: She is alert and oriented to person, place, and time.  Skin: Skin is warm. No rash noted.  Psychiatric: She has a normal mood and affect. Her behavior is normal.    ED Course  Procedures (including critical care time) Labs Review Labs Reviewed  CBC - Abnormal; Notable for the following:    WBC 3.4 (*)    RBC 3.86 (*)    Hemoglobin 10.4 (*)    HCT 30.7 (*)    All other components within normal limits  POC OCCULT BLOOD, ED    Imaging Review No results found.   EKG Interpretation None      MDM   Final diagnoses:  None    8:04 AM Pt is a 38 y.o. female with pertinent PMHX of breast cancer on tamoxifen, s/p bilateral mastectomy, anemia requiring transfusions, fibroids who presents to the ED with fatigue and concern for anemia. Previous anemia requiring transfusion  recently. No shortness of breath. Chest pain from picking up niece. Non pleuritic no shortness of breath. Recent isolated fever 101 on Friday, no fevers since. No URI symptoms. Chronic diarrhea being evaluated. 1 isolated episode of non bilious non  Bloody emesis 2 days ago. No vomiting since. No syncope or lightheadedness. No dysuria or hematuria. Mainly concerned she may be anemic. Has heavier than normal periods. history of fibroids. Pending hysterectomy with OB. No bleeding disorder.s no hematuria or blood in stool  On exam: well appearing, afebrile. Clear lung sounds,. Well healing bilateral mastectomy scars without drainage, fluctuance or evidence of cellulitis. GU exam: rectal exam: no gross blood. No concern for immunocompromise.  Plan to check CBC and stool hemooccult  Review of labs: Stool guaiac: negative CBc: H&H 10.4/30.7  No anemia, patient recently restarted work after surgery. Possible fatigue  related to going back to work recently. Plan for discharge. No evidence of need for emergent transfusion. Plan for discharge. Strict return precautions given. Patient to follow up with PCP as needed, OB as scheduled.  9:13 AM:  I have discussed the diagnosis/risks/treatment options with the patient and believe the pt to be eligible for discharge home to follow-up with PCP adn OB. We also discussed returning to the ED immediately if new or worsening sx occur. We discussed the sx which are most concerning (e.g., worsening symptoms) that necessitate immediate return. Any new prescriptions provided to the patient are listed below.   New Prescriptions   No medications on file    The patient appears reasonably screened and/or stabilized for discharge and I doubt any other medical condition or other [facility] requiring further screening, evaluation or treatment in the ED at this time prior to discharge . Pt in agreement with discharge plan. Return precautions given. Pt discharged VSS    Labs, reviewed  by myself and considered in medical decision making if ordered.  Pt was discussed with my attending, Dr. Dawna Part, MD 06/08/14 0919  Joanell Rising, MD 06/08/14 1538

## 2014-06-08 NOTE — ED Notes (Signed)
Pt feeling weak and lethargic x 4 days.  Hx of double mastectomy 9/10.  Pt just returned to work.

## 2014-06-11 ENCOUNTER — Encounter: Payer: 59 | Admitting: Physical Therapy

## 2014-06-12 NOTE — ED Provider Notes (Signed)
I saw and evaluated the patient, reviewed the resident's note and I agree with the findings and plan.   EKG Interpretation None       Leota Jacobsen, MD 06/12/14 1019

## 2014-06-18 ENCOUNTER — Ambulatory Visit: Payer: 59 | Attending: General Surgery | Admitting: Physical Therapy

## 2014-06-18 ENCOUNTER — Telehealth: Payer: Self-pay | Admitting: Physical Therapy

## 2014-06-18 DIAGNOSIS — M6281 Muscle weakness (generalized): Secondary | ICD-10-CM | POA: Insufficient documentation

## 2014-06-18 DIAGNOSIS — M25612 Stiffness of left shoulder, not elsewhere classified: Secondary | ICD-10-CM | POA: Insufficient documentation

## 2014-06-18 DIAGNOSIS — M25512 Pain in left shoulder: Secondary | ICD-10-CM | POA: Insufficient documentation

## 2014-06-18 NOTE — Telephone Encounter (Signed)
Pt missed appt today, called pt, she informed me that she is doing well and dont want to r/s missed appt. Serafina Royals PT notified

## 2014-06-23 ENCOUNTER — Other Ambulatory Visit: Payer: Self-pay | Admitting: Oncology

## 2014-06-23 DIAGNOSIS — C50911 Malignant neoplasm of unspecified site of right female breast: Secondary | ICD-10-CM

## 2014-06-25 ENCOUNTER — Telehealth: Payer: Self-pay | Admitting: Oncology

## 2014-06-25 ENCOUNTER — Ambulatory Visit: Payer: 59 | Admitting: Physical Therapy

## 2014-06-25 DIAGNOSIS — M25512 Pain in left shoulder: Secondary | ICD-10-CM | POA: Diagnosis present

## 2014-06-25 DIAGNOSIS — M25612 Stiffness of left shoulder, not elsewhere classified: Secondary | ICD-10-CM | POA: Diagnosis present

## 2014-06-25 DIAGNOSIS — M6281 Muscle weakness (generalized): Secondary | ICD-10-CM | POA: Diagnosis present

## 2014-06-25 DIAGNOSIS — R531 Weakness: Secondary | ICD-10-CM

## 2014-06-25 DIAGNOSIS — M79622 Pain in left upper arm: Secondary | ICD-10-CM

## 2014-06-25 NOTE — Therapy (Addendum)
Physical Therapy Treatment  Patient Details  Name: Cheryl Holloway MRN: [id number] Date of Birth: 05-12-1976  Encounter Date: 06/25/2014    Past Medical History  Diagnosis Date  . Breast cancer   . Anemia     takes iron  . Fibroids     uterine    Past Surgical History  Procedure Laterality Date  . Breast reconstruction    . Breast reduction surgery    . Gastric bypass    . Tubal ligation    . Cesarean section      x3  . Mastectomy      right  . Simple mastectomy with axillary sentinel node biopsy Left 04/25/2014    Procedure: LEFT TOTAL  MASTECTOMY (PROPHYLACTIC);  Surgeon: Rolm Bookbinder, MD;  Location: [place];  Service: General;  Laterality: Left;  . Breast implant removal Right 04/25/2014    Procedure: REMOVAL BREAST IMPLANT WITH CAPSULECTOMY;  Surgeon: Theodoro Kos, DO;  Location: [place];  Service: Plastics;  Laterality: Right;    LMP 06/01/2014  Visit Diagnosis:  Stiffness of shoulder joint, left - Plan: PT plan of care cert/re-cert  Pain in axilla, left - Plan: PT plan of care cert/re-cert  Decreased strength - Plan: PT plan of care cert/re-cert                  Problem List Patient Active Problem List   Diagnosis Date Noted  . S/P mastectomy 04/25/2014  . Breast cancer, right ([place]) 01/27/2014  . Gastric bypass status for obesity 01/27/2014  . Iron deficiency anemia due to chronic blood loss 01/27/2014          Long Term Clinic Goals - 09/11/15 1629    CC Long Term Goal  #1   Title patient reports pain 50% during her workday   Status Not Met   CC Long Term Goal  #2   Title patient will be independent in a strength training program   Status Not Met          [place],DONNA 09/11/2015, 4:31 PM     PHYSICAL THERAPY DISCHARGE SUMMARY  Visits from Start of Care: 3  Current functional level related to goals / functional outcomes: Goals not met.   Remaining deficits: Unknown.   Patient was expected to return for another visit, but did not.   Education / Equipment: HEP Plan: Patient agrees to discharge.  Patient goals were not met. Patient is being discharged due to not returning since the last visit.  ?????       Serafina Royals, PT 09/11/2015 4:31 PM

## 2014-06-25 NOTE — Telephone Encounter (Signed)
pt cld to CX appt-tried to offer pt anothrer date stated could nopt come-adv pt LL only in trwicw a week some weeks-stated she will call back to r/s

## 2014-06-26 ENCOUNTER — Other Ambulatory Visit: Payer: 59

## 2014-06-26 ENCOUNTER — Ambulatory Visit: Payer: 59 | Admitting: Oncology

## 2014-06-28 ENCOUNTER — Telehealth: Payer: Self-pay | Admitting: *Deleted

## 2014-06-28 ENCOUNTER — Other Ambulatory Visit: Payer: Self-pay | Admitting: Gastroenterology

## 2014-06-28 NOTE — Telephone Encounter (Signed)
Pt called requesting to reschedule missed MD visit. Message given to Audie Clear and she states she will reschedule and call pt back with new appt.

## 2014-07-01 ENCOUNTER — Telehealth: Payer: Self-pay | Admitting: Oncology

## 2014-07-01 NOTE — Telephone Encounter (Signed)
s/w pt re new appt for 12/7 @ 9:30am. appt r/s from 11/11.

## 2014-07-09 ENCOUNTER — Telehealth: Payer: Self-pay | Admitting: *Deleted

## 2014-07-09 NOTE — Telephone Encounter (Signed)
Received copy of pt's endo report from [facility]. Copy given to Dr. Marko Plume and original sent to HIM to be scanned into patient's chart.

## 2014-07-14 ENCOUNTER — Other Ambulatory Visit: Payer: Self-pay | Admitting: Oncology

## 2014-07-15 ENCOUNTER — Ambulatory Visit: Payer: 59 | Admitting: Physical Therapy

## 2014-07-22 ENCOUNTER — Encounter: Payer: Self-pay | Admitting: Oncology

## 2014-07-22 ENCOUNTER — Telehealth: Payer: Self-pay | Admitting: Oncology

## 2014-07-22 ENCOUNTER — Other Ambulatory Visit (HOSPITAL_BASED_OUTPATIENT_CLINIC_OR_DEPARTMENT_OTHER): Payer: 59

## 2014-07-22 ENCOUNTER — Ambulatory Visit (HOSPITAL_BASED_OUTPATIENT_CLINIC_OR_DEPARTMENT_OTHER): Payer: 59 | Admitting: Oncology

## 2014-07-22 VITALS — BP 140/86 | HR 75 | Temp 98.5°F | Resp 18 | Ht 65.0 in | Wt 143.9 lb

## 2014-07-22 DIAGNOSIS — C50911 Malignant neoplasm of unspecified site of right female breast: Secondary | ICD-10-CM

## 2014-07-22 DIAGNOSIS — D649 Anemia, unspecified: Secondary | ICD-10-CM

## 2014-07-22 DIAGNOSIS — D500 Iron deficiency anemia secondary to blood loss (chronic): Secondary | ICD-10-CM

## 2014-07-22 DIAGNOSIS — R197 Diarrhea, unspecified: Secondary | ICD-10-CM

## 2014-07-22 LAB — CBC & DIFF AND RETIC
BASO%: 2.2 % — ABNORMAL HIGH (ref 0.0–2.0)
BASOS ABS: 0.1 10*3/uL (ref 0.0–0.1)
EOS ABS: 0 10*3/uL (ref 0.0–0.5)
EOS%: 0.2 % (ref 0.0–7.0)
HEMATOCRIT: 31.2 % — AB (ref 34.8–46.6)
HEMOGLOBIN: 10.3 g/dL — AB (ref 11.6–15.9)
IMMATURE RETIC FRACT: 0.4 % — AB (ref 1.60–10.00)
LYMPH#: 1.2 10*3/uL (ref 0.9–3.3)
LYMPH%: 29.8 % (ref 14.0–49.7)
MCH: 26.2 pg (ref 25.1–34.0)
MCHC: 33 g/dL (ref 31.5–36.0)
MCV: 79.4 fL — ABNORMAL LOW (ref 79.5–101.0)
MONO#: 0.2 10*3/uL (ref 0.1–0.9)
MONO%: 4.9 % (ref 0.0–14.0)
NEUT#: 2.6 10*3/uL (ref 1.5–6.5)
NEUT%: 62.9 % (ref 38.4–76.8)
Platelets: 306 10*3/uL (ref 145–400)
RBC: 3.93 10*6/uL (ref 3.70–5.45)
RDW: 15.1 % — ABNORMAL HIGH (ref 11.2–14.5)
Retic %: 0.68 % — ABNORMAL LOW (ref 0.70–2.10)
Retic Ct Abs: 26.72 10*3/uL — ABNORMAL LOW (ref 33.70–90.70)
WBC: 4.1 10*3/uL (ref 3.9–10.3)

## 2014-07-22 LAB — COMPREHENSIVE METABOLIC PANEL (CC13)
ALK PHOS: 83 U/L (ref 40–150)
ALT: 42 U/L (ref 0–55)
AST: 87 U/L — ABNORMAL HIGH (ref 5–34)
Albumin: 2.8 g/dL — ABNORMAL LOW (ref 3.5–5.0)
Anion Gap: 10 mEq/L (ref 3–11)
BUN: 9.5 mg/dL (ref 7.0–26.0)
CO2: 25 mEq/L (ref 22–29)
Calcium: 8.1 mg/dL — ABNORMAL LOW (ref 8.4–10.4)
Chloride: 107 mEq/L (ref 98–109)
Creatinine: 0.7 mg/dL (ref 0.6–1.1)
Glucose: 109 mg/dl (ref 70–140)
Potassium: 4.3 mEq/L (ref 3.5–5.1)
SODIUM: 142 meq/L (ref 136–145)
TOTAL PROTEIN: 6.1 g/dL — AB (ref 6.4–8.3)
Total Bilirubin: 0.29 mg/dL (ref 0.20–1.20)

## 2014-07-22 NOTE — Telephone Encounter (Signed)
, °

## 2014-07-22 NOTE — Progress Notes (Signed)
OFFICE PROGRESS NOTE   07/22/2014   Physicians:K.Turner Daniels Sanger, Rolm Bookbinder, Wilford Corner       INTERVAL HISTORY:  Patient is seen, alone for visit, in follow up of right breast cancer for which she resumed adjuvant tamoxifen 05-15-14. She is post removal of right breast implant and prophylactic left mastectomy 04-25-14.   She is premenopausal, with heavy menses x 7 days every 28 days, which seems to be etiology for anemia. She is followed by Dr Beatrix Fetters, tells me that they have discussed hysterectomy. She also tells me that she tried some "non BCP" medication for the bleeding, which made her feel uncomfortable so was [facility] Dr Beatrix Fetters is aware of the tamoxifen per patient.   She was seen in ED 06-08-14 due to weakness and fatigue, Hgb then farily stable at 10.4 and FOB negative.  Because of anemia and diarrhea, she had colonoscopy by Dr Michail Sermon on 06-28-14, that report to be scanned into this EMR. She had one 10 mm polyp in rectum, biopsies of normal appearing colonic mucosa, biopsies of unremarkable appearing terminal ileum, and internal hemorrhoids. Pathology [facility] [phone] from 06-28-14) benign small bowel mucosa with associated benign lymphoid aggregate, benign colorectal mucosa with associated benign lymphoid aggregates, no dysplasia or malignancy in the polyp. She still has 4-5 bowel movements daily, which can wake her from sleep. I have asked her to let Dr Michail Sermon know this continues, and have also asked her to avoid all milk products and/ or gluten for >= one week to see if this makes any difference.   She is to see PCP Dr Lysle Rubens for 6 month follow up on 08-19-14.  She resumed tamoxifen after visit 04-2014, tolerating this with no difficulty. She denies increased hot flashes and has no LE swelling. She has no discomfort now right chest where implant removed, or at left prophylactic mastectomy site; she is delighted that  she had this surgery. She is concerned that she continues to lose weight, despite good appetite; she is drinking protein drink 3x daily. She works 4 days per week as cook, stands the entire day, lots of heavy lifting and physical exertion.  Energy is good.    No PAC Flu vaccine done.  ONCOLOGIC HISTORY Patient found mass in right breast adjacent to nipple in 04-2009 at age 77. This was 1.8 cm moderately differentiated invasive ductal carcinoma with micrometastais<31m in 1/3 sentinel nodes, associated high grade DCIS, surgical margins widely negative, ER+ PR +, HER 2 negative by FISH, angiolymphatic invasion present at right mastectomy with 3 sentinel nodes ([id number]. [facility] was [id number])pMx. [place] oncologist JaTor Nettersradiation oncologist Essam ShLucy ChrisShe had adjuvant taxotere cytoxan x 4 cycles from Dec 2010 thru 09-26-2009, then tamoxifen from 2011 until "2 years ago", so ~ 2013 when she stopped this due to lack of insurance. She recalls that she tolerated tamoxifen without difficulty, apparently was on Effexor for hot flashes then. She also may have been on zometa q 6 months per outside records, but does not recall this. She tells me that she was BRCA 1 and 2 negative (not in records received). Oncotype Dx from 07-2009 had breast cancer recurrence score of 13.  She had implant on right, with significant discomfort there requiring total of 3 [place] had continuous discomfort described as clawing or pulling under the implant for at  least 2 years. In the few notes available from previous physicians, she used percocet for this discomfort. MRI breast bilateral 01-25-14 showed probable intracapsular rupture of right silicone implant and no evidence of malignancy or adenopathy bilaterally. She has now had the ruptured right  breast implant removed and prophylactic left mastectomy by Drs Migdalia Dk and Donne Hazel on 04-25-14. All pathology was benign. She resumed tamoxifen 05-15-2014.    Review of systems as above, also: No fever. No increased SOB. No new or different pain. No other bleeding. Continues PT for stiffness left shoulder, but is not doing strenuous exercise with this PT. Remainder of 10 point Review of Systems negative.  Objective:  Vital signs in last 24 hours:  BP 140/86 mmHg  Pulse 75  Temp(Src) 98.5 F (36.9 C) (Oral)  Resp 18  Ht 5' 5" (1.651 m)  Wt 143 lb 14.4 oz (65.273 kg)  BMI 23.95 kg/m2  SpO2 99% weight is down 9 more pounds.  Alert, oriented and appropriate. Ambulatory without difficulty. Looks comfortable, just delightful as always and good historian.  HEENT:PERRL, sclerae not icteric. Oral mucosa moist without lesions, posterior pharynx clear. Mucous membranes somewhat pale. Neck supple. No JVD.  Lymphatics:no cervical,supraclavicular, axillary or inguinal adenopathy Resp: clear to auscultation bilaterally and normal percussion bilaterally Cardio: regular rate and rhythm. No gallop. GI: abdomen soft, nontender, not distended, no mass or organomegaly. Normally active bowel sounds.  Musculoskeletal/ Extremities: without pitting edema, cords, tenderness Neuro: no peripheral neuropathy. Otherwise nonfocal Skin without rash, ecchymosis, petechiae. Nailbeds pale Breasts: bilateral mastectomy scars well healed without evidence of recurrence on right and nothing of concern on left. Axillae benign.   Lab Results:  Results for orders placed or performed in visit on 07/22/14  Comprehensive metabolic panel (Cmet) - [facility]  Result Value Ref Range   Sodium 142 136 - 145 mEq/L   Potassium 4.3 3.5 - 5.1 mEq/L   Chloride 107 98 - 109 mEq/L   CO2 25 22 - 29 mEq/L   Glucose 109 70 - 140 mg/dl   BUN 9.5 7.0 - 26.0 mg/dL   Creatinine 0.7 0.6 - 1.1 mg/dL   Total Bilirubin 0.29 0.20 - 1.20 mg/dL    Alkaline Phosphatase 83 40 - 150 U/L   AST 87 (H) 5 - 34 U/L   ALT 42 0 - 55 U/L   Total Protein 6.1 (L) 6.4 - 8.3 g/dL   Albumin 2.8 (L) 3.5 - 5.0 g/dL   Calcium 8.1 (L) 8.4 - 10.4 mg/dL   Anion Gap 10 3 - 11 mEq/L   EGFR >90 >90 ml/min/1.73 m2  CBC & Diff and Retic  Result Value Ref Range   WBC 4.1 3.9 - 10.3 10e3/uL   NEUT# 2.6 1.5 - 6.5 10e3/uL   HGB 10.3 (L) 11.6 - 15.9 g/dL   HCT 31.2 (L) 34.8 - 46.6 %   Platelets 306 145 - 400 10e3/uL   MCV 79.4 (L) 79.5 - 101.0 fL   MCH 26.2 25.1 - 34.0 pg   MCHC 33.0 31.5 - 36.0 g/dL   RBC 3.93 3.70 - 5.45 10e6/uL   RDW 15.1 (H) 11.2 - 14.5 %   lymph# 1.2 0.9 - 3.3 10e3/uL   MONO# 0.2 0.1 - 0.9 10e3/uL   Eosinophils Absolute 0.0 0.0 - 0.5 10e3/uL   Basophils Absolute 0.1 0.0 - 0.1 10e3/uL   NEUT% 62.9 38.4 - 76.8 %   LYMPH% 29.8 14.0 - 49.7 %   MONO% 4.9 0.0 - 14.0 %   EOS%  0.2 0.0 - 7.0 %   BASO% 2.2 (H) 0.0 - 2.0 %   Retic % 0.68 (L) 0.70 - 2.10 %   Retic Ct Abs 26.72 (L) 33.70 - 90.70 10e3/uL   Immature Retic Fract 0.40 (L) 1.60 - 10.00 %     Studies/Results:  No results found.  Medications: I have reviewed the patient's current medications. As she is tolerating ferrous fumarate, ok to try increasing this to bid, on empty stomach with OJ.  DISCUSSION: all of above information discussed  Assessment/Plan:  1.T1N51m M0 right breast cancer diagnosed 06-2009 at age 38 ER PR +, HER 2-, BRCA reportedly negative. Treated with right mastectomy with 3 sentinel nodes, adjuvant taxotere, carboplatin x 4 thru 09-2009 and ~ 2 years of tamoxifen thru 2013. [id number] Post prophylactic left mastectomy 04-25-14. Resumed tamoxifen 05-15-14. I will see her in 6 mo with labs, or sooner if needed.  2.chronic discomfort right breast since silicone implant, which was also ruptured. Right sided discomfort resolved with removal of implant 04-25-14. She does not want further reconstruction.  3.recent weight loss  unclear etiology: with frequent bowel movements with GI evaluation o/w ok,  TSH ok and HIV negative per Dr HLysle Rubens Even with supplements she may still not be getting enough calories. Follow up with Dr HLysle Rubensearly Jan. No evidence that weight loss is cancer associated. 4.post gastric bypass 2009, at which time she weighed 230 lbs. I wonder if the GI complaints are "dumping syndrome"? 5.anemia since 2010: B12 and iron normal but was on oral iron at time of the lab evaluation. Transfused 2 units PRBCs after surgery, note that she had significant bleeding from surgical sites. Heavy gyn bleeding. INR ok. Has not had bone marrow exam. Not more symptomatic.  6.previously elevated LFTs which may be related to fatty liver, hepatitis negative per Dr HLysle Rubens    Following visit, I think may be helpful for her to meet with CNashand we will set that up if she agrees. Cc this note to Drs HMathews Robinsons  Patient understood discussion and is in agreement with recommendations above. Time spent 25 min including >50% counseling and coordination of care.  Heena Woodbury P, MD   07/22/2014, 10:32 AM

## 2014-07-22 NOTE — Patient Instructions (Signed)
Dr Lysle Rubens is to see you Jan 4 at 9:15. He will get labs after visit.

## 2014-07-30 ENCOUNTER — Telehealth: Payer: Self-pay

## 2014-07-30 NOTE — Telephone Encounter (Signed)
-----   Message from Gordy Levan, MD sent at 07/27/2014  1:56 PM EST ----- Sweet young lady continuing to lose weight unclear reasons, also diarrhea with negative GI eval. Had gastric bypass several years ago  RN - please ask patient if she would be interested in seeing dietician. Please make referral if so  Barb - I wonder if worthwhile to try diet adjustments for dumping syndrome? Or maybe eliminate milk products or gluten as trial? If you prefer dietician outside of [place] see her, please let my RN know   Thanks, ya'll Lennis

## 2014-07-30 NOTE — Telephone Encounter (Signed)
Left a message in Ms. Plater's  VM regarding interest in referral to dietician as noted below by Dr. Marko Plume.  Requested that Ms. Plater call back to discuss.

## 2014-08-06 ENCOUNTER — Other Ambulatory Visit: Payer: Self-pay | Admitting: Oncology

## 2014-10-03 ENCOUNTER — Telehealth: Payer: Self-pay | Admitting: Oncology

## 2014-10-03 NOTE — Telephone Encounter (Signed)
, °

## 2014-11-26 ENCOUNTER — Emergency Department (HOSPITAL_COMMUNITY)
Admission: EM | Admit: 2014-11-26 | Discharge: 2014-11-26 | Disposition: A | Discharge status: Home / Self Care | Payer: 59 | Attending: Emergency Medicine | Admitting: Emergency Medicine

## 2014-11-26 ENCOUNTER — Encounter (HOSPITAL_COMMUNITY): Payer: Self-pay | Admitting: Emergency Medicine

## 2014-11-26 DIAGNOSIS — Y990 Civilian activity done for income or pay: Secondary | ICD-10-CM | POA: Diagnosis not present

## 2014-11-26 DIAGNOSIS — Y9289 Other specified places as the place of occurrence of the external cause: Secondary | ICD-10-CM | POA: Insufficient documentation

## 2014-11-26 DIAGNOSIS — Y288XXA Contact with other sharp object, undetermined intent, initial encounter: Secondary | ICD-10-CM | POA: Diagnosis not present

## 2014-11-26 DIAGNOSIS — Y9389 Activity, other specified: Secondary | ICD-10-CM | POA: Diagnosis not present

## 2014-11-26 DIAGNOSIS — S61219A Laceration without foreign body of unspecified finger without damage to nail, initial encounter: Secondary | ICD-10-CM

## 2014-11-26 DIAGNOSIS — Z86018 Personal history of other benign neoplasm: Secondary | ICD-10-CM | POA: Diagnosis not present

## 2014-11-26 DIAGNOSIS — D649 Anemia, unspecified: Secondary | ICD-10-CM | POA: Diagnosis not present

## 2014-11-26 DIAGNOSIS — Z79899 Other long term (current) drug therapy: Secondary | ICD-10-CM | POA: Insufficient documentation

## 2014-11-26 DIAGNOSIS — S61210A Laceration without foreign body of right index finger without damage to nail, initial encounter: Secondary | ICD-10-CM | POA: Insufficient documentation

## 2014-11-26 DIAGNOSIS — Z853 Personal history of malignant neoplasm of breast: Secondary | ICD-10-CM | POA: Insufficient documentation

## 2014-11-26 MED ORDER — OXYCODONE-ACETAMINOPHEN 5-325 MG PO TABS: 1.0000 | ORAL_TABLET | ORAL | Status: DC | PRN

## 2014-11-26 MED ORDER — LIDOCAINE-EPINEPHRINE-TETRACAINE (LET) SOLUTION
3.0000 mL | Freq: Once | NASAL | Status: AC
  Administered 2014-11-26: 3 mL via TOPICAL
  Filled 2014-11-26: qty 3

## 2014-11-26 NOTE — Discharge Instructions (Signed)

## 2014-11-26 NOTE — ED Notes (Signed)
Patient states that she was slicing vegetables at work and cut her pointer finger

## 2014-11-26 NOTE — ED Provider Notes (Signed)
CSN: [id number]     Arrival date & time 11/26/14  1027 History   First MD Initiated Contact with Patient 11/26/14 [phone]     Chief Complaint  Patient presents with  . Laceration    finger right  pointer     (Consider location/radiation/quality/duration/timing/severity/associated sxs/prior Treatment) HPI  39 year old female with right index finger injury. Happened just before arrival. Patient was slicing vegetables when she cut the tip of her finger. Persistent bleeding since. No numbness and tingling. Denies any other injury. No intervention prior to arrival aside from local pressure.  Past Medical History  Diagnosis Date  . Breast cancer   . Anemia     takes iron  . Fibroids     uterine   Past Surgical History  Procedure Laterality Date  . Breast reconstruction    . Breast reduction surgery    . Gastric bypass    . Tubal ligation    . Cesarean section      x3  . Mastectomy      right  . Simple mastectomy with axillary sentinel node biopsy Left 04/25/2014    Procedure: LEFT TOTAL  MASTECTOMY (PROPHYLACTIC);  Surgeon: Rolm Bookbinder, MD;  Location: [place];  Service: General;  Laterality: Left;  . Breast implant removal Right 04/25/2014    Procedure: REMOVAL BREAST IMPLANT WITH CAPSULECTOMY;  Surgeon: Theodoro Kos, DO;  Location: [place];  Service: Plastics;  Laterality: Right;   No family history on file. History  Substance Use Topics  . Smoking status: Never Smoker   . Smokeless tobacco: Not on file  . Alcohol Use: Yes     Comment: social   OB History    No data available     Review of Systems  All systems reviewed and negative, other than as noted in HPI.   Allergies  Review of patient's allergies indicates no known allergies.  Home Medications   Prior to Admission medications   Medication Sig Start Date End Date Taking? Authorizing Provider  ferrous fumarate (HEMOCYTE - 106 MG FE) 325 (106 FE) MG TABS tablet Take 1  tablet by mouth daily.    Historical Provider, MD  fish oil-omega-3 fatty acids 1000 MG capsule Take 1 g by mouth daily.    Historical Provider, MD  Ibuprofen-Diphenhydramine HCl (ADVIL PM) 200-25 MG CAPS Take 1 capsule by mouth at bedtime as needed.    Historical Provider, MD  Multiple Vitamin (MULTIVITAMIN WITH MINERALS) TABS Take 1 tablet by mouth daily.    Historical Provider, MD  OVER THE COUNTER MEDICATION 1 tablet daily of  Potassium    Historical Provider, MD  tamoxifen (NOLVADEX) 20 MG tablet Take 1 tablet (20 mg total) by mouth daily. 05/15/14   Lennis Marion Downer, MD  vitamin B-12 (CYANOCOBALAMIN) 500 MCG tablet Take 500 mcg by mouth daily.    Historical Provider, MD  VITAMIN E PO Take 400 Units by mouth daily.     Historical Provider, MD   BP 146/102 mmHg  Pulse 87  Temp(Src) 98.5 F (36.9 C) (Oral)  Resp 18  Ht 5\' 5"  (1.651 m)  Wt 143 lb (64.864 kg)  BMI 23.80 kg/m2  SpO2 98%  LMP 08/27/2014 Physical Exam  Constitutional: She appears well-developed and well-nourished. No distress.  HENT:  Head: Normocephalic and atraumatic.  Eyes: Conjunctivae are normal. Right eye exhibits no discharge. Left eye exhibits no discharge.  Neck: Neck supple.  Cardiovascular: Normal rate, regular rhythm and normal heart sounds.  Exam  reveals no gallop and no friction rub.   No murmur heard. Pulmonary/Chest: Effort normal and breath sounds normal. No respiratory distress.  Abdominal: Soft. She exhibits no distension. There is no tenderness.  Musculoskeletal:  Dime sized area of avulsed tissue to the distal aspect right index finger mild oozing. Does not involve the nail but rather surrounding deeper structures.  Neurological: She is alert.  Skin: Skin is warm and dry.  Psychiatric: She has a normal mood and affect. Her behavior is normal. Thought content normal.  Nursing note and vitals reviewed.   ED Course  Procedures (including critical care time) Labs Review Labs Reviewed - No data to  display  Imaging Review No results found.   EKG Interpretation None      MDM   Final diagnoses:  None    39yF with finger laceration. Essentially avulsed the skin. Unfortunately not enough surrounding tissue in this area to undermine and close. Should heal fine by secondary intention. Continue wound care was discussed. Return precautions discussed as well.    Virgel Manifold, MD 12/02/14 [phone]

## 2015-01-19 ENCOUNTER — Other Ambulatory Visit: Payer: Self-pay | Admitting: Oncology

## 2015-01-20 ENCOUNTER — Other Ambulatory Visit: Payer: Self-pay

## 2015-01-20 ENCOUNTER — Ambulatory Visit: Payer: Self-pay | Admitting: Oncology

## 2015-01-28 ENCOUNTER — Telehealth: Payer: Self-pay

## 2015-01-28 NOTE — Telephone Encounter (Signed)
Left a message for Cheryl Holloway to call back to the [facility] at [phone] and R/S missed appointment on 01-20-15.  She needs to follow up with Dr. Marko Plume to see how she is doing especially with being on Tamoxifen.

## 2015-08-28 ENCOUNTER — Telehealth: Payer: Self-pay

## 2015-08-28 DIAGNOSIS — C50911 Malignant neoplasm of unspecified site of right female breast: Secondary | ICD-10-CM

## 2015-08-28 MED ORDER — TAMOXIFEN CITRATE 20 MG PO TABS: 20.0000 mg | ORAL_TABLET | Freq: Every day | ORAL | Status: DC

## 2015-08-28 NOTE — Telephone Encounter (Signed)
Refill request came in for Tamoxifen 20 mg tab daily # 30  for Cheryl Holloway from [employer] [facility]. It was written on 05-15-14 with 5 refills.  Last filled on 12-31-14. Pt missed follow up appointment on 01-20-15.   Spoke with Cheryl Holloway and she said that she did not have insurace until recently and so she did not keep appointment and taking the Tamoxifen ~QOD to stretch out the cost. Gave Cheryl Holloway an appointment with Dr. Marko Plume for Feb. 13,2017 and 1 month supply of Tamoxifen as she has 7 tabs left of current prescription. Will give further refills at appointment. Cheryl Holloway denies any issues with side effects from the tamoxifen.

## 2015-09-25 ENCOUNTER — Other Ambulatory Visit: Payer: Self-pay | Admitting: Oncology

## 2015-09-25 DIAGNOSIS — D500 Iron deficiency anemia secondary to blood loss (chronic): Secondary | ICD-10-CM

## 2015-09-25 DIAGNOSIS — C50911 Malignant neoplasm of unspecified site of right female breast: Secondary | ICD-10-CM

## 2015-09-27 ENCOUNTER — Other Ambulatory Visit: Payer: Self-pay | Admitting: Oncology

## 2015-09-29 ENCOUNTER — Other Ambulatory Visit (HOSPITAL_BASED_OUTPATIENT_CLINIC_OR_DEPARTMENT_OTHER): Payer: BLUE CROSS/BLUE SHIELD

## 2015-09-29 ENCOUNTER — Telehealth: Payer: Self-pay | Admitting: Oncology

## 2015-09-29 ENCOUNTER — Ambulatory Visit (HOSPITAL_BASED_OUTPATIENT_CLINIC_OR_DEPARTMENT_OTHER): Payer: BLUE CROSS/BLUE SHIELD | Admitting: Oncology

## 2015-09-29 ENCOUNTER — Encounter: Payer: Self-pay | Admitting: Oncology

## 2015-09-29 VITALS — BP 138/94 | HR 94 | Temp 98.6°F | Resp 18 | Ht 65.0 in | Wt 143.7 lb

## 2015-09-29 DIAGNOSIS — Z23 Encounter for immunization: Secondary | ICD-10-CM | POA: Diagnosis not present

## 2015-09-29 DIAGNOSIS — Z9884 Bariatric surgery status: Secondary | ICD-10-CM

## 2015-09-29 DIAGNOSIS — N898 Other specified noninflammatory disorders of vagina: Secondary | ICD-10-CM

## 2015-09-29 DIAGNOSIS — R634 Abnormal weight loss: Secondary | ICD-10-CM

## 2015-09-29 DIAGNOSIS — D500 Iron deficiency anemia secondary to blood loss (chronic): Secondary | ICD-10-CM

## 2015-09-29 DIAGNOSIS — C50911 Malignant neoplasm of unspecified site of right female breast: Secondary | ICD-10-CM

## 2015-09-29 DIAGNOSIS — R7989 Other specified abnormal findings of blood chemistry: Secondary | ICD-10-CM

## 2015-09-29 DIAGNOSIS — D649 Anemia, unspecified: Secondary | ICD-10-CM

## 2015-09-29 LAB — CBC WITH DIFFERENTIAL/PLATELET
BASO%: 0.7 % (ref 0.0–2.0)
Basophils Absolute: 0 10*3/uL (ref 0.0–0.1)
EOS%: 0.4 % (ref 0.0–7.0)
Eosinophils Absolute: 0 10*3/uL (ref 0.0–0.5)
HCT: 27.1 % — ABNORMAL LOW (ref 34.8–46.6)
HGB: 8.8 g/dL — ABNORMAL LOW (ref 11.6–15.9)
LYMPH#: 1.2 10*3/uL (ref 0.9–3.3)
LYMPH%: 21.2 % (ref 14.0–49.7)
MCH: 25.8 pg (ref 25.1–34.0)
MCHC: 32.5 g/dL (ref 31.5–36.0)
MCV: 79.5 fL (ref 79.5–101.0)
MONO#: 0.4 10*3/uL (ref 0.1–0.9)
MONO%: 6.9 % (ref 0.0–14.0)
NEUT#: 3.9 10*3/uL (ref 1.5–6.5)
NEUT%: 70.8 % (ref 38.4–76.8)
Platelets: 148 10*3/uL (ref 145–400)
RBC: 3.41 10*6/uL — AB (ref 3.70–5.45)
RDW: 16.1 % — ABNORMAL HIGH (ref 11.2–14.5)
WBC: 5.5 10*3/uL (ref 3.9–10.3)
nRBC: 0 % (ref 0–0)

## 2015-09-29 LAB — COMPREHENSIVE METABOLIC PANEL
ALBUMIN: 2.4 g/dL — AB (ref 3.5–5.0)
ALK PHOS: 135 U/L (ref 40–150)
ALT: 101 U/L — ABNORMAL HIGH (ref 0–55)
AST: 148 U/L — ABNORMAL HIGH (ref 5–34)
Anion Gap: 7 mEq/L (ref 3–11)
BUN: <4 mg/dL — ABNORMAL LOW (ref 7.0–26.0)
CALCIUM: 7.2 mg/dL — AB (ref 8.4–10.4)
CHLORIDE: 109 meq/L (ref 98–109)
CO2: 24 mEq/L (ref 22–29)
Creatinine: 0.7 mg/dL (ref 0.6–1.1)
Glucose: 104 mg/dl (ref 70–140)
POTASSIUM: 4 meq/L (ref 3.5–5.1)
SODIUM: 141 meq/L (ref 136–145)
Total Bilirubin: 0.61 mg/dL (ref 0.20–1.20)
Total Protein: 6 g/dL — ABNORMAL LOW (ref 6.4–8.3)

## 2015-09-29 LAB — IRON AND TIBC
%SAT: 71 % — AB (ref 21–57)
IRON: 94 ug/dL (ref 41–142)
TIBC: 131 ug/dL — ABNORMAL LOW (ref 236–444)
UIBC: 37 ug/dL — AB (ref 120–384)

## 2015-09-29 MED ORDER — INFLUENZA VAC SPLIT QUAD 0.5 ML IM SUSY
0.5000 mL | PREFILLED_SYRINGE | Freq: Once | INTRAMUSCULAR | Status: AC
  Administered 2015-09-29: 0.5 mL via INTRAMUSCULAR
  Filled 2015-09-29: qty 0.5

## 2015-09-29 NOTE — Telephone Encounter (Signed)
Appointments made and avs printed. Dr Simona Huh appointment info given to patient

## 2015-09-29 NOTE — Progress Notes (Signed)
OFFICE PROGRESS NOTE   September 29, 2015   Physicians: K.Turner Daniels Sanger, Rolm Bookbinder, Wilford Corner   INTERVAL HISTORY:  Patient is seen, for first time back at this office since 07-2014 due to insurance problems, still on adjuvant tamoxifen for premenopausal breast cancer 04-2009. The right invasive ductal breast cancer was 1.8 cm, micrometastasis in 1/3 sentinel nodes, ER PR + HER 2 -, associated high grade DCIS. She used tamoxifen from 2011 thru 2013, stopped then also due to lack of insurance, then resumed tamoxifen 05-15-14. She has used tamoxifen every other day at times in past year due to financial concerns. Plan had been at least 5 years total tamoxifen.  She continues irregular and at times heavy vaginal bleeding, last x 3 days heavy with clots end of Jan. She reports bleeding generally more than one time monthly, tho also went 3 months without menses.  She may not have seen Dr Simona Huh since ~ fall 2015, but is very willing to go back to that office. I have spoken with Dr Andy Gauss office now, has opening tomorrow AM (09-30-15) which patient will take. I have told patient that irregular bleeding needs to be evaluated due to tamoxifen as well as anemia etc.  She continues oral iron. She does not know if hemoglobin has been checked recently. No other bleeding.  Patient has recently seen PCP Dr Lysle Rubens, follow up in a year "unless needed".  She had colonoscopy by Dr Michail Sermon 06-28-14, one polyp in rectum, other biopsies nondiagnostic, this due to multiple bowel movements daily x several years. She presently has 4-5 bowel movements daily, stools generally float. Appetite is excellent, however she has a difficult time maintaining weight.   Patient has no chest wall pain since removal of right breast implant and prophylactic left mastectomy 04-2014. She notices no changes at either mastectomy scar.  Review of Systems otherwise:  No recent fever or  symptoms of infection. No pain. No increased SOB or other respiratory symptoms. Energy "mostly ok". No bladder symptoms. No LE swelling. No HA or other neurologic symptoms. Has not tried avoiding fats in diet.     ONCOLOGIC HISTORY Patient found mass in right breast adjacent to nipple in 04-2009 at age 23. This was 1.8 cm moderately differentiated invasive ductal carcinoma with micrometastais<31m in 1/3 sentinel nodes, associated high grade DCIS, surgical margins widely negative, ER+ PR +, HER 2 negative by FISH, angiolymphatic invasion present at right mastectomy with 3 sentinel nodes [id number]. Path staging was [id number])pMx. [place] oncologist [facility] Net[place]radiation oncologist Essam ShLucy ChrisShe had adjuvant taxotere cytoxan x 4 cycles from Dec 2010 thru 09-26-2009, then tamoxifen from 2011 until "2 years ago", so ~ 2013 when she stopped this due to lack of insurance. She recalls that she tolerated tamoxifen without difficulty, apparently was on Effexor for hot flashes then. She also may have been on zometa q 6 months per outside records, but does not recall this. She tells me that she was BRCA 1 and 2 negative (not in records received). Oncotype Dx from 07-2009 had breast cancer recurrence score of 13.  She had implant on right, with significant discomfort there requiring total of 3 [place] had continuous discomfort described as clawing or pulling under the implant for at least 2 years. In the few notes available from previous physicians, she used percocet for this  discomfort. MRI breast bilateral 01-25-14 showed probable intracapsular rupture of right silicone implant and no evidence of malignancy or adenopathy bilaterally. She has now had the ruptured right breast implant removed and prophylactic left mastectomy by Drs Migdalia Dk and  Donne Hazel on 04-25-14. All pathology was benign. She resumed tamoxifen 05-15-2014, then took this intermittently at times up until 09-2015 due to lack of insurance.    Objective:  Vital signs in last 24 hours:  BP 138/94 mmHg  Pulse 94  Temp(Src) 98.6 F (37 C) (Oral)  Resp 18  Ht 5' 5"  (1.651 m)  Wt 143 lb 11.2 oz (65.182 kg)  BMI 23.91 kg/m2  SpO2 94% Weight stable compared to 07-2014 Alert, oriented and appropriate, very pleasant, good historian. Ambulatory difficulty. Respirations not labored with activity in exam room.   HEENT:PERRL, sclerae not icteric. Oral mucosa moist without lesions, mucous membranes pale, posterior pharynx clear.  Neck supple. No JVD.  Lymphatics:no cervical,supraclavicular, axillary or inguinal adenopathy Resp: clear to auscultation bilaterally and normal percussion bilaterally Cardio: regular rate and rhythm. No gallop. GI: soft, nontender, not distended, no mass or organomegaly. Normally active bowel sounds.  Musculoskeletal/ Extremities: without pitting edema, cords, tenderness. Nailbeds pale Neuro: no peripheral neuropathy. Otherwise nonfocal Skin without rash, ecchymosis, petechiae Breasts: bilateral mastectomy scars without evidence of local recurrence on right and not remarkable on left Axillae benign.   Lab Results:  Results for orders placed or performed in visit on 09/29/15  CBC with Differential  Result Value Ref Range   WBC 5.5 3.9 - 10.3 10e3/uL   NEUT# 3.9 1.5 - 6.5 10e3/uL   HGB 8.8 Repeated and Verified (L) 11.6 - 15.9 g/dL   HCT 27.1 (L) 34.8 - 46.6 %   Platelets 148 145 - 400 10e3/uL   MCV 79.5 79.5 - 101.0 fL   MCH 25.8 25.1 - 34.0 pg   MCHC 32.5 31.5 - 36.0 g/dL   RBC 3.41 (L) 3.70 - 5.45 10e6/uL   RDW 16.1 (H) 11.2 - 14.5 %   lymph# 1.2 0.9 - 3.3 10e3/uL   MONO# 0.4 0.1 - 0.9 10e3/uL   Eosinophils Absolute 0.0 0.0 - 0.5 10e3/uL   Basophils Absolute 0.0 0.0 - 0.1 10e3/uL   NEUT% 70.8 38.4 - 76.8 %   LYMPH% 21.2 14.0 -  49.7 %   MONO% 6.9 0.0 - 14.0 %   EOS% 0.4 0.0 - 7.0 %   BASO% 0.7 0.0 - 2.0 %   nRBC 0 0 - 0 %  Comprehensive metabolic panel  Result Value Ref Range   Sodium 141 136 - 145 mEq/L   Potassium 4.0 3.5 - 5.1 mEq/L   Chloride 109 98 - 109 mEq/L   CO2 24 22 - 29 mEq/L   Glucose 104 70 - 140 mg/dl   BUN <4.0 (L) 7.0 - 26.0 mg/dL   Creatinine 0.7 0.6 - 1.1 mg/dL   Total Bilirubin 0.61 0.20 - 1.20 mg/dL   Alkaline Phosphatase 135 40 - 150 U/L   AST 148 (H) 5 - 34 U/L   ALT 101 (H) 0 - 55 U/L   Total Protein 6.0 (L) 6.4 - 8.3 g/dL   Albumin 2.4 (L) 3.5 - 5.0 g/dL   Calcium 7.2 (L) 8.4 - 10.4 mg/dL   Anion Gap 7 3 - 11 mEq/L   EGFR >90 >90 ml/min/1.73 m2  Iron and TIBC  Result Value Ref Range   Iron 94 41 - 142 ug/dL   TIBC 131 (L) 236 - 444 ug/dL  UIBC 37 (L) 120 - 384 ug/dL   %SAT 71 (H) 21 - 57 %    Iron studies drawn on oral iron.  Studies/Results:  No results found.  Medications: I have reviewed the patient's current medications. Flu vaccine given today. Discussed tamiflu. Continue po iron  Just began weekly Vit D by Dr Lysle Rubens  DISCUSSION Interval history discussed as above. Patient in agreement with visit back to Dr Simona Huh on 09-30-15 - thank you! Will let her know results of iron studies.  Unless findings of concern by Dr Simona Huh, will suggest continuing tamoxifen for ~ 2 more years.  Will be in touch with Dr Michail Sermon, ? If other evaluation including fecal fat appropriate. Recommended she avoid fats in diet/ may need to speak with nutritionist again.   Assessment/Plan: 1.T1N41m M0 right breast cancer diagnosed 06-2009 at age 40 ER PR +, HER 2-, BRCA reportedly negative. Treated with right mastectomy with 3 sentinel nodes, adjuvant taxotere, carboplatin x 4 thru 09-2009 and ~ 2 years of tamoxifen thru 2013. [facility] Post prophylactic left mastectomy 04-25-14. Resumed tamoxifen 05-15-14 not entirely compliant due to financial issues.  I will see her in 6 mo with labs, or sooner if needed. 2.chronic discomfort right breast since silicone implant, which was also ruptured. Right sided discomfort resolved with removal of implant 04-25-14. She does not want further reconstruction.  3.Difficulty maintaining weight unclear etiology: with frequent bowel movements with GI evaluation o/w ok, TSH ok and HIV negative per Dr HLysle RubensNo evidence that weight loss is cancer associated. ?consider checking stool for fat? Will defer that to GI and PCP 4.post gastric bypass 2009, at which time she weighed 230 lbs. I wonder if the GI complaints are "dumping syndrome"? 5.anemia since 2010: B12 and iron normal but was on oral iron at time of the lab evaluation. Transfused 2 units PRBCs after surgery, note that she had significant bleeding from surgical sites. Heavy gyn bleeding. INR ok. Has not had bone marrow exam. Not more symptomatic.  6. elevated LFTs which may be related to fatty liver, hepatitis negative per Dr HLysle Rubens Cc all labs to Dr HLysle Rubensand Dr SMichail Sermon   All questions answered, I will see her in 6 mo or sooner if needed. Cc Dr VSimona Huh(fax [phone] phone [phone], Dr HLysle Rubens Dr SMichail Sermon  Time spent 25 min including >50% counseling and coordination of care.    LIVESAY,LENNIS P, MD   09/29/2015, 10:56 AM

## 2015-09-30 ENCOUNTER — Other Ambulatory Visit: Payer: Self-pay | Admitting: Obstetrics and Gynecology

## 2015-09-30 ENCOUNTER — Telehealth: Payer: Self-pay

## 2015-09-30 DIAGNOSIS — D62 Acute posthemorrhagic anemia: Secondary | ICD-10-CM | POA: Insufficient documentation

## 2015-09-30 DIAGNOSIS — R634 Abnormal weight loss: Secondary | ICD-10-CM | POA: Insufficient documentation

## 2015-09-30 DIAGNOSIS — C50919 Malignant neoplasm of unspecified site of unspecified female breast: Secondary | ICD-10-CM

## 2015-09-30 DIAGNOSIS — Z23 Encounter for immunization: Secondary | ICD-10-CM | POA: Insufficient documentation

## 2015-09-30 DIAGNOSIS — C50911 Malignant neoplasm of unspecified site of right female breast: Secondary | ICD-10-CM | POA: Insufficient documentation

## 2015-09-30 HISTORY — DX: Malignant neoplasm of unspecified site of unspecified female breast: C50.919

## 2015-09-30 NOTE — Telephone Encounter (Signed)
Called Dr. Andy Gauss office and faxed notes as requested below by Dr. Marko Plume.

## 2015-09-30 NOTE — Telephone Encounter (Signed)
-----   Message from Gordy Levan, MD sent at 09/30/2015  9:36 AM EST ----- Please fax my note from 2-13 to Dr Thurnell Lose this AM for apt there at 1000 today Fax [phone] and office phone [phone]  Also CBC and CMET from 2-13 separately (they are in note also)  thanks

## 2015-10-01 LAB — CYTOLOGY - PAP

## 2015-10-21 ENCOUNTER — Other Ambulatory Visit: Payer: Self-pay | Admitting: Gastroenterology

## 2015-10-21 DIAGNOSIS — R945 Abnormal results of liver function studies: Principal | ICD-10-CM

## 2015-10-21 DIAGNOSIS — K760 Fatty (change of) liver, not elsewhere classified: Secondary | ICD-10-CM

## 2015-10-21 DIAGNOSIS — R7989 Other specified abnormal findings of blood chemistry: Secondary | ICD-10-CM

## 2015-10-27 ENCOUNTER — Inpatient Hospital Stay: Admission: RE | Admit: 2015-10-27 | Payer: BLUE CROSS/BLUE SHIELD | Source: Ambulatory Visit

## 2015-10-28 ENCOUNTER — Ambulatory Visit
Admission: RE | Admit: 2015-10-28 | Discharge: 2015-10-28 | Disposition: A | Discharge status: Home / Self Care | Payer: BLUE CROSS/BLUE SHIELD | Source: Ambulatory Visit | Attending: Gastroenterology | Admitting: Gastroenterology

## 2015-10-28 DIAGNOSIS — R945 Abnormal results of liver function studies: Principal | ICD-10-CM

## 2015-10-28 DIAGNOSIS — R7989 Other specified abnormal findings of blood chemistry: Secondary | ICD-10-CM

## 2015-10-28 DIAGNOSIS — K760 Fatty (change of) liver, not elsewhere classified: Secondary | ICD-10-CM

## 2015-12-02 ENCOUNTER — Telehealth: Payer: Self-pay

## 2015-12-02 NOTE — Telephone Encounter (Signed)
-----   Message from Gordy Levan, MD sent at 12/02/2015  2:36 PM EDT ----- Regarding: RE: tamoxifen I don't see that we got a note back from Dr Simona Huh, who was to see patient after my visit in Feb. Could you please get her office note for me to see before we refill the tamoxifen?  thanks ----- Message -----    From: Janace Hoard, RN    Sent: 12/02/2015   2:24 PM      To: Lennis Marion Downer, MD Subject: tamoxifen                                      Received refill request for tamoxifen. The 2/13 OV note under DISCUSSION states "unless finding of concern by Dr Simona Huh, will suggest continuation tamoxifen for about 2 more years."  Do I refill? Juliann Pulse

## 2015-12-02 NOTE — Telephone Encounter (Signed)
Notes received from Dr Simona Huh 2/14 and 2/22. Copy sent to HIM and copy on Dr Edwyna Shell desk.

## 2015-12-16 ENCOUNTER — Telehealth: Payer: Self-pay | Admitting: Oncology

## 2015-12-16 NOTE — Telephone Encounter (Signed)
MEDICAL ONCOLOGY  Re Tamoxifen refill requests.  It is my understanding that patient saw Dr Simona Huh and was referred for hysterectomy for fibroids, but that patient is not proceeding with surgery possibly for financial reasons.  (these outside records not located in EMR now)  I have spoken with Dr Simona Huh directly now; her information indicates that they have not heard back from patient re hysterectomy. We reviewed history of the tamoxifen (2011-2013 and 05-15-14 to present). Patient has multiple fibroids, thought etiology for the heavy gyn bleeding, which . Dr Simona Huh glad to see her back and recommends endometrial biopsy if patient not doing hysterectomy. Her office will contact patient.   Patient likely needs repeat hemoglobin if not done since my visit 09-2015. I prefer to hold tamoxifen until gyn re-evaluation.   Godfrey Pick, MD

## 2015-12-23 ENCOUNTER — Telehealth: Payer: Self-pay

## 2015-12-23 DIAGNOSIS — D500 Iron deficiency anemia secondary to blood loss (chronic): Secondary | ICD-10-CM

## 2015-12-23 NOTE — Addendum Note (Signed)
Addended by: Janace Hoard on: 12/23/2015 04:30 PM   Modules accepted: Orders

## 2015-12-23 NOTE — Telephone Encounter (Signed)
Pt is planning a biopsy by the end of the month, she is having to change the existing appt. Pt is aware of holding tamoxifen until re-eval by gyn. She is willing to see dr Marko Plume in about 4 weeks. She agreed on Thursday the 8th at 9 am. POF sent.

## 2015-12-23 NOTE — Telephone Encounter (Signed)
-----   Message from Gordy Levan, MD sent at 12/16/2015  2:45 PM EDT ----- Regarding: RE: tamoxifen I spoke with Dr Simona Huh now - see phone note.   RN please confirm with patient tht she is not having hysterectomy now (I believe for insurance reasons, tho I cannot find that information now).  Tell her that I spoke with Dr Simona Huh, who would like to check on her if not doing hysterectomy - Dr Andy Gauss office to call patient. I think best to hold tamoxifen until re-evaluation by gyn. I am glad to see her back to check the anemia and to discuss above. If she agrees, please set up appointment in next ~ 4 weeks + CBC   thanks  ----- Message -----    From: Janace Hoard, RN    Sent: 12/16/2015  12:37 PM      To: Baruch Merl, RN, Gordy Levan, MD Subject: tamoxifen                                      I do not see a note of whether to stop or continue the tamoxifen. Pt's next appt is 8/14 and I would hate for her to miss months worth of medication if needed. See telephone note of 4/18.

## 2015-12-24 ENCOUNTER — Telehealth: Payer: Self-pay | Admitting: Oncology

## 2015-12-24 NOTE — Telephone Encounter (Signed)
lvm for pt regarding to June appt... °

## 2016-01-01 ENCOUNTER — Inpatient Hospital Stay (HOSPITAL_COMMUNITY)
Admission: EM | Admit: 2016-01-01 | Discharge: 2016-01-03 | DRG: 378 | Disposition: A | Discharge status: Home / Self Care | Payer: Self-pay | Attending: Internal Medicine | Admitting: Internal Medicine

## 2016-01-01 ENCOUNTER — Encounter (HOSPITAL_COMMUNITY): Payer: Self-pay

## 2016-01-01 DIAGNOSIS — C50911 Malignant neoplasm of unspecified site of right female breast: Secondary | ICD-10-CM | POA: Diagnosis present

## 2016-01-01 DIAGNOSIS — Z853 Personal history of malignant neoplasm of breast: Secondary | ICD-10-CM

## 2016-01-01 DIAGNOSIS — Z9010 Acquired absence of unspecified breast and nipple: Secondary | ICD-10-CM

## 2016-01-01 DIAGNOSIS — D500 Iron deficiency anemia secondary to blood loss (chronic): Secondary | ICD-10-CM | POA: Diagnosis present

## 2016-01-01 DIAGNOSIS — K922 Gastrointestinal hemorrhage, unspecified: Secondary | ICD-10-CM

## 2016-01-01 DIAGNOSIS — Z79810 Long term (current) use of selective estrogen receptor modulators (SERMs): Secondary | ICD-10-CM

## 2016-01-01 DIAGNOSIS — Z9013 Acquired absence of bilateral breasts and nipples: Secondary | ICD-10-CM

## 2016-01-01 DIAGNOSIS — Z9884 Bariatric surgery status: Secondary | ICD-10-CM

## 2016-01-01 DIAGNOSIS — N920 Excessive and frequent menstruation with regular cycle: Secondary | ICD-10-CM | POA: Diagnosis present

## 2016-01-01 DIAGNOSIS — K284 Chronic or unspecified gastrojejunal ulcer with hemorrhage: Principal | ICD-10-CM | POA: Diagnosis present

## 2016-01-01 DIAGNOSIS — K5710 Diverticulosis of small intestine without perforation or abscess without bleeding: Secondary | ICD-10-CM | POA: Diagnosis present

## 2016-01-01 DIAGNOSIS — R634 Abnormal weight loss: Secondary | ICD-10-CM | POA: Diagnosis present

## 2016-01-01 DIAGNOSIS — K760 Fatty (change of) liver, not elsewhere classified: Secondary | ICD-10-CM | POA: Diagnosis present

## 2016-01-01 DIAGNOSIS — D62 Acute posthemorrhagic anemia: Secondary | ICD-10-CM | POA: Diagnosis present

## 2016-01-01 DIAGNOSIS — Z8711 Personal history of peptic ulcer disease: Secondary | ICD-10-CM

## 2016-01-01 LAB — CBC
HEMATOCRIT: 11.3 % — AB (ref 36.0–46.0)
HEMOGLOBIN: 3.7 g/dL — AB (ref 12.0–15.0)
MCH: 24.8 pg — ABNORMAL LOW (ref 26.0–34.0)
MCHC: 32.7 g/dL (ref 30.0–36.0)
MCV: 75.8 fL — ABNORMAL LOW (ref 78.0–100.0)
Platelets: 318 10*3/uL (ref 150–400)
RBC: 1.49 MIL/uL — ABNORMAL LOW (ref 3.87–5.11)
RDW: 16 % — ABNORMAL HIGH (ref 11.5–15.5)
WBC: 12 10*3/uL — AB (ref 4.0–10.5)

## 2016-01-01 LAB — DIFFERENTIAL
Basophils Absolute: 0 10*3/uL (ref 0.0–0.1)
Basophils Relative: 0 %
Eosinophils Absolute: 0 10*3/uL (ref 0.0–0.7)
Eosinophils Relative: 0 %
LYMPHS ABS: 0.8 10*3/uL (ref 0.7–4.0)
LYMPHS PCT: 7 %
MONO ABS: 0.3 10*3/uL (ref 0.1–1.0)
MONOS PCT: 2 %
NEUTROS ABS: 10.5 10*3/uL — AB (ref 1.7–7.7)
Neutrophils Relative %: 91 %

## 2016-01-01 LAB — COMPREHENSIVE METABOLIC PANEL
ALBUMIN: 1.7 g/dL — AB (ref 3.5–5.0)
ALT: 21 U/L (ref 14–54)
ANION GAP: 10 (ref 5–15)
AST: 36 U/L (ref 15–41)
Alkaline Phosphatase: 63 U/L (ref 38–126)
BILIRUBIN TOTAL: 0.2 mg/dL — AB (ref 0.3–1.2)
BUN: 11 mg/dL (ref 6–20)
CHLORIDE: 108 mmol/L (ref 101–111)
CO2: 20 mmol/L — ABNORMAL LOW (ref 22–32)
Calcium: 7.3 mg/dL — ABNORMAL LOW (ref 8.9–10.3)
Creatinine, Ser: 0.78 mg/dL (ref 0.44–1.00)
GFR calc Af Amer: >60 mL/min (ref >60)
GFR calc non Af Amer: >60 mL/min (ref >60)
GLUCOSE: 170 mg/dL — AB (ref 65–99)
POTASSIUM: 4 mmol/L (ref 3.5–5.1)
SODIUM: 138 mmol/L (ref 135–145)
TOTAL PROTEIN: 4 g/dL — AB (ref 6.5–8.1)

## 2016-01-01 LAB — HCG, SERUM, QUALITATIVE: Preg, Serum: NEGATIVE

## 2016-01-01 LAB — VITAMIN B12: VITAMIN B 12: 689 pg/mL (ref 180–914)

## 2016-01-01 LAB — POC OCCULT BLOOD, ED: Fecal Occult Bld: POSITIVE — AB

## 2016-01-01 LAB — LIPASE, BLOOD: Lipase: <10 U/L — ABNORMAL LOW (ref 11–51)

## 2016-01-01 LAB — IRON AND TIBC
Iron: 42 ug/dL (ref 28–170)
SATURATION RATIOS: 34 % — AB (ref 10.4–31.8)
TIBC: 122 ug/dL — AB (ref 250–450)
UIBC: 80 ug/dL

## 2016-01-01 LAB — PREPARE RBC (CROSSMATCH)

## 2016-01-01 LAB — MRSA PCR SCREENING: MRSA BY PCR: NEGATIVE

## 2016-01-01 LAB — FERRITIN: Ferritin: 42 ng/mL (ref 11–307)

## 2016-01-01 LAB — RETICULOCYTES
RBC.: 1.53 MIL/uL — AB (ref 3.87–5.11)
RETIC CT PCT: 3.9 % — AB (ref 0.4–3.1)
Retic Count, Absolute: 59.7 10*3/uL (ref 19.0–186.0)

## 2016-01-01 LAB — FOLATE: FOLATE: 41.4 ng/mL (ref >5.9)

## 2016-01-01 MED ORDER — ACETAMINOPHEN 325 MG PO TABS: 650.0000 mg | ORAL_TABLET | Freq: Four times a day (QID) | ORAL | Status: DC | PRN

## 2016-01-01 MED ORDER — ACETAMINOPHEN 650 MG RE SUPP: 650.0000 mg | Freq: Four times a day (QID) | RECTAL | Status: DC | PRN

## 2016-01-01 MED ORDER — ONDANSETRON HCL 4 MG/2ML IJ SOLN: 4.0000 mg | Freq: Four times a day (QID) | INTRAMUSCULAR | Status: DC | PRN

## 2016-01-01 MED ORDER — FOLIC ACID 5 MG/ML IJ SOLN
1.0000 mg | Freq: Every day | INTRAMUSCULAR | Status: DC
Start: 2016-01-01 — End: 2016-01-02
  Filled 2016-01-01 (×2): qty 0.2

## 2016-01-01 MED ORDER — THIAMINE HCL 100 MG/ML IJ SOLN: 100.0000 mg | Freq: Every day | INTRAMUSCULAR | Status: DC

## 2016-01-01 MED ORDER — ONDANSETRON HCL 4 MG PO TABS: 4.0000 mg | ORAL_TABLET | Freq: Four times a day (QID) | ORAL | Status: DC | PRN

## 2016-01-01 MED ORDER — SODIUM CHLORIDE 0.9 % IV SOLN
80.0000 mg | Freq: Once | INTRAVENOUS | Status: AC
  Administered 2016-01-01: 80 mg via INTRAVENOUS
  Filled 2016-01-01: qty 80

## 2016-01-01 MED ORDER — SODIUM CHLORIDE 0.9 % IV SOLN
8.0000 mg/h | INTRAVENOUS | Status: DC
  Administered 2016-01-01 (×2): 8 mg/h via INTRAVENOUS
  Filled 2016-01-01 (×6): qty 80

## 2016-01-01 MED ORDER — PANTOPRAZOLE SODIUM 40 MG IV SOLR: 40.0000 mg | Freq: Two times a day (BID) | INTRAVENOUS | Status: DC

## 2016-01-01 MED ORDER — SODIUM CHLORIDE 0.9 % IV SOLN
10.0000 mL/h | Freq: Once | INTRAVENOUS | Status: AC
  Administered 2016-01-01: 10 mL/h via INTRAVENOUS

## 2016-01-01 MED ORDER — SODIUM CHLORIDE 0.9% FLUSH
3.0000 mL | Freq: Two times a day (BID) | INTRAVENOUS | Status: DC
  Administered 2016-01-01 – 2016-01-03 (×2): 3 mL via INTRAVENOUS

## 2016-01-01 MED ORDER — SODIUM CHLORIDE 0.9 % IV SOLN
Freq: Once | INTRAVENOUS | Status: AC
  Administered 2016-01-01: 17:00:00 via INTRAVENOUS

## 2016-01-01 NOTE — H&P (Signed)
History and Physical    Any Izbicki [id number] DOB: [birth year] DOA: 01/01/2016   PCP: Wenda Low, MD   Patient coming from/Resides with: Private residence/husband and children  Chief Complaint: Nausea and visible blood in stool  HPI: Cheryl Holloway is a 40 y.o. female with medical history significant for breast cancer/high-grade DCIS status post mastectomy and up until 2 months ago maintained on tamoxifen, history of gastric bypass and prior peptic ulcer disease, history of unintentional weight loss and setting of chronic diarrhea and suspected dumping syndrome followed by GI as an outpatient, fatty liver disease, chronic microcytic iron deficiency anemia in setting of chronic blood loss from heavy menses. Patient presents to the ER today secondary to nausea, epigastric pain and melanotic stools with some bright red blood ongoing for 2-3 days. Since onset of symptoms she sat at least 5 large volume melanotic stools. She has had epigastric as well as perumbilical pain. She has had nausea without emesis. Prior to onset of symptoms she had been having a significant amount of dental pain and was self-medicating with OTC NSAIDs. Of note she reports she is no longer on tamoxifen and has not taken this for at least 2 weeks secondary to issues of heavy menses and her physicians taking her off of this medication. In's onset of above symptoms patient has been reporting dizziness and weakness and excessive fatigue especially with activity.  ED Course:  PO 1098.5-BP 110/95-pulse 101-respirations 14-room air saturations 100% Lab data: Sodium 138, potassium 4.0, BUN 11, creatinine 0.78, calcium 7.3, glucose 170, albumin 1.7, lipase less than 10, total bilirubin 0.2, AST and ALT are normal, WBC 12,000 with neutrophils 91% absolute neutral 10.5%, hemoglobin 3.7 with hematocrit 11.3, MCV 75.8, platelets 318,000 2 units PRBCs ordered by the ER Protonix infusion and 8 mg per hour  Review of Systems:  In  addition to the HPI above,  No Fever-chills, myalgias or other constitutional symptoms No Headache, changes with Vision or hearing, new weakness, tingling, numbness in any extremity, No problems swallowing food or Liquids, indigestion/reflux No Chest pain, Cough or Shortness of Breath, palpitations, orthopnea or DOE No dysuria, hematuria or flank pain No new skin rashes, lesions, masses or bruises, No new joints pains-aches No recent weight gain or loss No polyuria, polydypsia or polyphagia,   Past Medical History  Diagnosis Date  . Breast cancer ([place])   . Anemia     takes iron  . Fibroids     uterine    Past Surgical History  Procedure Laterality Date  . Breast reconstruction    . Breast reduction surgery    . Gastric bypass    . Tubal ligation    . Cesarean section      x3  . Mastectomy      right  . Simple mastectomy with axillary sentinel node biopsy Left 04/25/2014    Procedure: LEFT TOTAL  MASTECTOMY (PROPHYLACTIC);  Surgeon: Rolm Bookbinder, MD;  Location: [facility];  Service: General;  Laterality: Left;  . Breast implant removal Right 04/25/2014    Procedure: REMOVAL BREAST IMPLANT WITH CAPSULECTOMY;  Surgeon: Theodoro Kos, DO;  Location: [facility];  Service: Plastics;  Laterality: Right;     reports that she has never smoked. She does not have any smokeless tobacco history on file. She reports that she drinks alcohol. She reports that she does not use illicit drugs.  Mobility: Without assistive devices Work history: Works at [employer]   No Known Allergies  Family history  reviewed and not pertinent to current admission symptoms and diagnoses  Prior to Admission medications   Medication Sig Start Date End Date Taking? Authorizing Provider  ferrous fumarate (HEMOCYTE - 106 MG FE) 325 (106 FE) MG TABS tablet Take 1 tablet by mouth daily.   Yes Historical Provider, MD  fish oil-omega-3 fatty acids 1000 MG capsule  Take 1 g by mouth daily.   Yes Historical Provider, MD  Ibuprofen-Diphenhydramine HCl (ADVIL PM) 200-25 MG CAPS Take 1 capsule by mouth at bedtime as needed. For sleep   Yes Historical Provider, MD  Multiple Vitamin (MULTIVITAMIN WITH MINERALS) TABS Take 1 tablet by mouth daily.   Yes Historical Provider, MD  tamoxifen (NOLVADEX) 20 MG tablet Take 1 tablet (20 mg total) by mouth daily. 08/28/15  Yes Lennis Marion Downer, MD  vitamin B-12 (CYANOCOBALAMIN) 500 MCG tablet Take 500 mcg by mouth daily.   Yes Historical Provider, MD  VITAMIN E PO Take 400 Units by mouth daily.    Yes Historical Provider, MD    Physical Exam: Filed Vitals:   01/01/16 1008 01/01/16 1045 01/01/16 1115 01/01/16 1130  BP: 103/67 110/95 108/64 108/67  Pulse: 101  84   Temp: 98.5 F (36.9 C)     TempSrc: Oral     Resp: 18 14 14    Height: 5\' 5"  (1.651 m)     Weight: 145 lb (65.772 kg)     SpO2: 100%  100%       Constitutional: NAD, calm, comfortableAlthough pale in appearance Eyes: PERRL, lids and conjunctivae pale ENMT: Mucous membranes are moist. Posterior pharynx clear of any exudate or lesions.Normal dentition.  Neck: normal, supple, no masses, no thyromegaly Respiratory: clear to auscultation bilaterally, no wheezing, no crackles. Normal respiratory effort. No accessory muscle use.  Cardiovascular: Regular rate and rhythm although becomes tachycardic with activity, no murmurs / rubs / gallops. No extremity edema. 2+ pedal pulses. No carotid bruits.  Abdomen: Tender with minimal guarding but no rebounding in the epigastric region, no periumbilical pain with palpation, no masses palpated. No hepatosplenomegaly. Bowel sounds positive.  Musculoskeletal: no clubbing / cyanosis. No joint deformity upper and lower extremities. Good ROM, no contractures. Normal muscle tone.  Skin: no rashes, lesions, ulcers. No induration Neurologic: CN 2-12 grossly intact. Sensation intact, DTR normal. Strength 5/5 x all 4 extremities.    Psychiatric: Normal judgment and insight. Alert and oriented x 3. Normal mood.    Labs on Admission: I have personally reviewed following labs and imaging studies  CBC:  Recent Labs Lab 01/01/16 1031 01/01/16 1045  WBC 12.0*  --   NEUTROABS  --  10.5*  HGB 3.7*  --   HCT 11.3*  --   MCV 75.8*  --   PLT 318  --    Basic Metabolic Panel:  Recent Labs Lab 01/01/16 1031  NA 138  K 4.0  CL 108  CO2 20*  GLUCOSE 170*  BUN 11  CREATININE 0.78  CALCIUM 7.3*   GFR: Estimated Creatinine Clearance: 85 mL/min (by C-G formula based on Cr of 0.78). Liver Function Tests:  Recent Labs Lab 01/01/16 1031  AST 36  ALT 21  ALKPHOS 63  BILITOT 0.2*  PROT 4.0*  ALBUMIN 1.7*    Recent Labs Lab 01/01/16 1031  LIPASE <10*   No results for input(s): AMMONIA in the last 168 hours. Coagulation Profile: No results for input(s): INR, PROTIME in the last 168 hours. Cardiac Enzymes: No results for input(s): CKTOTAL, CKMB, CKMBINDEX, TROPONINI  in the last 168 hours. BNP (last 3 results) No results for input(s): PROBNP in the last 8760 hours. HbA1C: No results for input(s): HGBA1C in the last 72 hours. CBG: No results for input(s): GLUCAP in the last 168 hours. Lipid Profile: No results for input(s): CHOL, HDL, LDLCALC, TRIG, CHOLHDL, LDLDIRECT in the last 72 hours. Thyroid Function Tests: No results for input(s): TSH, T4TOTAL, FREET4, T3FREE, THYROIDAB in the last 72 hours. Anemia Panel: No results for input(s): VITAMINB12, FOLATE, FERRITIN, TIBC, IRON, RETICCTPCT in the last 72 hours. Urine analysis:    Component Value Date/Time   COLORURINE YELLOW 08/20/2013 0827   APPEARANCEUR CLEAR 08/20/2013 0827   LABSPEC 1.017 08/20/2013 0827   PHURINE 6.0 08/20/2013 0827   GLUCOSEU NEGATIVE 08/20/2013 0827   HGBUR NEGATIVE 08/20/2013 0827   BILIRUBINUR NEGATIVE 08/20/2013 0827   KETONESUR NEGATIVE 08/20/2013 0827   PROTEINUR NEGATIVE 08/20/2013 0827   UROBILINOGEN 0.2  08/20/2013 0827   NITRITE NEGATIVE 08/20/2013 0827   LEUKOCYTESUR NEGATIVE 08/20/2013 0827   Sepsis Labs: @LABRCNTIP (procalcitonin:4,lacticidven:4) )No results found for this or any previous visit (from the past 240 hour(s)).   Radiological Exams on Admission: No results found.  EKG: (Independently reviewed) Sinus rhythm with ventricular rate 83 bpm, QTC 432 ms, elevated J-point in inferior lateral leads without definitive ischemic changes and no change since last EKG  Assessment/Plan Principal Problem:   Acute GI bleeding -History and current symptoms suggestive of upper GI bleeding -[facility] consulted -Continue Protonix infusion and NPO -Check serum H. Pylori -No NSAIDs including IV  Active Problems:   Iron deficiency anemia due to chronic blood loss/ Acute blood loss anemia -Known iron deficiency anemia chronic related to prior gastric bypass as well as recent issues with heavy menses now exacerbated by acute GI bleeding -Baseline hemoglobin around 10 -We'll go ahead and transfuse her units PRBCs then repeat CBC post transfusion; likely may require more blood -Anemia panel specifically checking iron and B12 prior to transfusion -Although patient tolerating well at rest patient becomes tachycardic disease week and fatigued with activity    Gastric bypass status for obesity/Unintentional weight loss -Patient has been followed by GI for chronic recurrent diarrhea likely related to gastric bypass procedure i.e. dumping syndrome -As noted above patient does have multiple chronic mineral deficiencies related to her bypass procedure    Breast cancer, right/S/P mastectomy -Last outpatient evaluation with her oncologist was 09/29/15 recommendations to follow up in 6 months    Fatty liver -Previously had elevated transaminases which are now normal -Evaluated by GI who ordered abdominal ultrasound March 2017; this was consistent with severe diffuse hepatic steatosis i.e. fatty liver        DVT prophylaxis: SCDs Code Status: Full code Family Communication: Sister and husband at bedside Disposition Plan: Anticipate discharge back to preadmission home environment once medically stable Consults called: [facility]  Admission status: Observation/stepdown     Marcey Persad L. ANP-BC [facility] Pager [phone]   If 7PM-7AM, please contact night-coverage [email or web address] Password [email or web address]  01/01/2016, 1:25 PM

## 2016-01-01 NOTE — ED Notes (Signed)
Attempted report to 2C.  

## 2016-01-01 NOTE — ED Provider Notes (Signed)
CSN: [id number]     Arrival date & time 01/01/16  1001 History   First MD Initiated Contact with Patient 01/01/16 1029     Chief Complaint  Patient presents with  . Nausea  . blood in stool      (Consider location/radiation/quality/duration/timing/severity/associated sxs/prior Treatment) HPI 40 year old female who presents with rectal bleeding. She has a history of anemia, fibroids, gastric bypass, and history of peptic ulcer disease. No prior history of GI bleed. Has been seen by Dr. Michail Sermon from [facility]. States that over the past week she has been taking increasing amounts of ibuprofen for dental pain. 2-3 days ago she developed black tarry stools and bright red blood per rectum. Has had 4-5 episodes over the past 2 days. Has been feeling more lightheaded, fatigued. Denies any shortness of breath, syncope, or chest pain. Denies any vaginal bleeding, hematemesis, vomiting. Has had some epigastric discomfort with nausea. No fevers or chills.   Past Medical History  Diagnosis Date  . Breast cancer ([place])   . Anemia     takes iron  . Fibroids     uterine   Past Surgical History  Procedure Laterality Date  . Breast reconstruction    . Breast reduction surgery    . Gastric bypass    . Tubal ligation    . Cesarean section      x3  . Mastectomy      right  . Simple mastectomy with axillary sentinel node biopsy Left 04/25/2014    Procedure: LEFT TOTAL  MASTECTOMY (PROPHYLACTIC);  Surgeon: Rolm Bookbinder, MD;  Location: [place];  Service: General;  Laterality: Left;  . Breast implant removal Right 04/25/2014    Procedure: REMOVAL BREAST IMPLANT WITH CAPSULECTOMY;  Surgeon: Theodoro Kos, DO;  Location: [place];  Service: Plastics;  Laterality: Right;   History reviewed. No pertinent family history. Social History  Substance Use Topics  . Smoking status: Never Smoker   . Smokeless tobacco: None  . Alcohol Use: Yes     Comment: social   OB  History    No data available     Review of Systems 10/14 systems reviewed and are negative other than those stated in the HPI    Allergies  Review of patient's allergies indicates no known allergies.  Home Medications   Prior to Admission medications   Medication Sig Start Date End Date Taking? Authorizing Provider  ferrous fumarate (HEMOCYTE - 106 MG FE) 325 (106 FE) MG TABS tablet Take 1 tablet by mouth daily.   Yes Historical Provider, MD  fish oil-omega-3 fatty acids 1000 MG capsule Take 1 g by mouth daily.   Yes Historical Provider, MD  Ibuprofen-Diphenhydramine HCl (ADVIL PM) 200-25 MG CAPS Take 1 capsule by mouth at bedtime as needed. For sleep   Yes Historical Provider, MD  Multiple Vitamin (MULTIVITAMIN WITH MINERALS) TABS Take 1 tablet by mouth daily.   Yes Historical Provider, MD  tamoxifen (NOLVADEX) 20 MG tablet Take 1 tablet (20 mg total) by mouth daily. 08/28/15  Yes Lennis Marion Downer, MD  vitamin B-12 (CYANOCOBALAMIN) 500 MCG tablet Take 500 mcg by mouth daily.   Yes Historical Provider, MD  VITAMIN E PO Take 400 Units by mouth daily.    Yes Historical Provider, MD   BP 108/67 mmHg  Pulse 84  Temp(Src) 98.5 F (36.9 C) (Oral)  Resp 14  Ht 5\' 5"  (1.651 m)  Wt 145 lb (65.772 kg)  BMI 24.13 kg/m2  SpO2  100%  LMP 12/01/2015 Physical Exam Physical Exam  Nursing note and vitals reviewed. Constitutional: Well developed, well nourished, non-toxic, and in no acute distress Head: Normocephalic and atraumatic.  Mouth/Throat: Oropharynx is clear and moist. Mucous membranes pale Neck: Normal range of motion. Neck supple.  Eyes: conjunctival pallor Cardiovascular: Normal rate and regular rhythm.   Pulmonary/Chest: Effort normal and breath sounds normal.  Abdominal: Soft. There is mild epigastric tenderness. There is no rebound and no guarding. BRBPR on rectal exam No stool Musculoskeletal: Normal range of motion.  Neurological: Alert, no facial droop, fluent speech,  moves all extremities symmetrically Skin: Skin is warm and dry.  Psychiatric: Cooperative  ED Course  Procedures (including critical care time) Labs Review Labs Reviewed  COMPREHENSIVE METABOLIC PANEL - Abnormal; Notable for the following:    CO2 20 (*)    Glucose, Bld 170 (*)    Calcium 7.3 (*)    Total Protein 4.0 (*)    Albumin 1.7 (*)    Total Bilirubin 0.2 (*)    All other components within normal limits  CBC - Abnormal; Notable for the following:    WBC 12.0 (*)    RBC 1.49 (*)    Hemoglobin 3.7 (*)    HCT 11.3 (*)    MCV 75.8 (*)    MCH 24.8 (*)    RDW 16.0 (*)    All other components within normal limits  DIFFERENTIAL - Abnormal; Notable for the following:    Neutro Abs 10.5 (*)    All other components within normal limits  LIPASE, BLOOD - Abnormal; Notable for the following:    Lipase <10 (*)    All other components within normal limits  POC OCCULT BLOOD, ED - Abnormal; Notable for the following:    Fecal Occult Bld POSITIVE (*)    All other components within normal limits  HCG, SERUM, QUALITATIVE  VITAMIN B12  FOLATE  IRON AND TIBC  FERRITIN  RETICULOCYTES  H PYLORI, IGM, IGG, IGA AB  TYPE AND SCREEN  PREPARE RBC (CROSSMATCH)    Imaging Review No results found. I have personally reviewed and evaluated these images and lab results as part of my medical decision-making.   EKG Interpretation   Date/Time:  Thursday Jan 01 2016 11:20:38 EDT Ventricular Rate:  83 PR Interval:  138 QRS Duration: 86 QT Interval:  368 QTC Calculation: 432 R Axis:   45 Text Interpretation:  Sinus rhythm No acute ST or wave changes. No change  since last EKg  Confirmed by Urban Naval MD, Natalin Bible [phone]) on 01/01/2016 1:11:30 PM      CRITICAL CARE Performed by: Forde Dandy   Total critical care time: 35 minutes  Critical care time was exclusive of separately billable procedures and treating other patients.  Critical care was necessary to treat or prevent imminent or  life-threatening deterioration.  Critical care was time spent personally by me on the following activities: development of treatment plan with patient and/or surrogate as well as nursing, discussions with consultants, evaluation of patient's response to treatment, examination of patient, obtaining history from patient or surrogate, ordering and performing treatments and interventions, ordering and review of laboratory studies, ordering and review of radiographic studies, pulse oximetry and re-evaluation of patient's condition.   MDM   Final diagnoses:  UGIB (upper gastrointestinal bleed)  Acute blood loss anemia    40 year old female who presents with GI bleeding, presumed to be upper in nature. History of peptic ulcer disease and has been taking increasing  amounts of ibuprofen for dental pain. Is not on anticoagulation. With mild intermittent tachycardia on arrival, but normotensive, mentating normally. Has a soft and benign abdomen. Bright red blood per rectum on rectal exam but no stool or melena. Her hemoglobin is 3.7. I ordered 2 units to transfuse, and 2 units are on hold for additional transfusion. No evidence of thrombocytopenia. It was initiated on a protonic strip. Discussed with Dr. Vernice Jefferson from [facility] who will see patient in the hospital for potential endoscopy. Discussed with Dr. Lissa Merlin who will admit patient to stepdown.     Forde Dandy, MD 01/01/16 [phone]

## 2016-01-01 NOTE — ED Notes (Signed)
Pt requested cranberry juice to drink, OK per Darrold Span pt given the same.

## 2016-01-01 NOTE — ED Notes (Addendum)
Patient here with abdominal pain and nausea since Tuesday. Reports blood noted in stool described as dark and frank blood. 4 episodes of same. Patient pale on arrival

## 2016-01-01 NOTE — Consult Note (Signed)
Reason for Consult: GI bleeding Referring Physician: Hospital team  Cheryl Holloway is an 40 y.o. female.  HPI: Patient seen and examined and our office computer chart and her hospital computer chart was reviewed and she is known to my partner Dr. Michail Sermon and she had a gastric bypass done in [place] about 8 years ago and had lost about 200 pounds but his been fairly stable lately and has a history of ulcers years ago and an endoscopy in [place] but no previous GI bleeding but has had a tooth problem and has been taking increased ibuprofen and began seeing some bright red blood in the water and black stools on Tuesday and she did have a colonoscopy in 2015 for her episodic diarrhea without significant findings and she has no other complaints  Past Medical History  Diagnosis Date  . Breast cancer ([place])   . Anemia     takes iron  . Fibroids     uterine    Past Surgical History  Procedure Laterality Date  . Breast reconstruction    . Breast reduction surgery    . Gastric bypass    . Tubal ligation    . Cesarean section      x3  . Mastectomy      right  . Simple mastectomy with axillary sentinel node biopsy Left 04/25/2014    Procedure: LEFT TOTAL  MASTECTOMY (PROPHYLACTIC);  Surgeon: Rolm Bookbinder, MD;  Location: [place];  Service: General;  Laterality: Left;  . Breast implant removal Right 04/25/2014    Procedure: REMOVAL BREAST IMPLANT WITH CAPSULECTOMY;  Surgeon: Theodoro Kos, DO;  Location: [place];  Service: Plastics;  Laterality: Right;    History reviewed. No pertinent family history.  Social History:  reports that she has never smoked. She does not have any smokeless tobacco history on file. She reports that she drinks alcohol. She reports that she does not use illicit drugs. I have reviewed the patient's current medications. Allergies: No Known Allergies  Medications:   Results for orders placed or performed during the hospital  encounter of 01/01/16 (from the past 48 hour(s))  Comprehensive metabolic panel     Status: Abnormal   Collection Time: 01/01/16 10:31 AM  Result Value Ref Range   Sodium 138 135 - 145 mmol/L   Potassium 4.0 3.5 - 5.1 mmol/L   Chloride 108 101 - 111 mmol/L   CO2 20 (L) 22 - 32 mmol/L   Glucose, Bld 170 (H) 65 - 99 mg/dL   BUN 11 6 - 20 mg/dL   Creatinine, Ser 0.78 0.44 - 1.00 mg/dL   Calcium 7.3 (L) 8.9 - 10.3 mg/dL   Total Protein 4.0 (L) 6.5 - 8.1 g/dL   Albumin 1.7 (L) 3.5 - 5.0 g/dL   AST 36 15 - 41 U/L   ALT 21 14 - 54 U/L   Alkaline Phosphatase 63 38 - 126 U/L   Total Bilirubin 0.2 (L) 0.3 - 1.2 mg/dL   GFR calc non Af Amer >60 >60 mL/min   GFR calc Af Amer >60 >60 mL/min    Comment: (NOTE) The eGFR has been calculated using the CKD EPI equation. This calculation has not been validated in all clinical situations. eGFR's persistently <60 mL/min signify possible Chronic Kidney Disease.    Anion gap 10 5 - 15  CBC     Status: Abnormal   Collection Time: 01/01/16 10:31 AM  Result Value Ref Range   WBC 12.0 (H) 4.0 -  10.5 K/uL   RBC 1.49 (L) 3.87 - 5.11 MIL/uL   Hemoglobin 3.7 (LL) 12.0 - 15.0 g/dL    Comment: REPEATED TO VERIFY CRITICAL RESULT CALLED TO, READ BACK BY AND VERIFIED WITH: B BLACK,RN [id number] WILDERK    HCT 11.3 (L) 36.0 - 46.0 %   MCV 75.8 (L) 78.0 - 100.0 fL   MCH 24.8 (L) 26.0 - 34.0 pg   MCHC 32.7 30.0 - 36.0 g/dL   RDW 16.0 (H) 11.5 - 15.5 %   Platelets 318 150 - 400 K/uL  Type and screen Blunt     Status: None (Preliminary result)   Collection Time: 01/01/16 10:31 AM  Result Value Ref Range   ABO/RH(D) A POS    Antibody Screen NEG    Sample Expiration 01/04/2016    Unit Number [id number]    Blood Component Type RED CELLS,LR    Unit division 00    Status of Unit ALLOCATED    Transfusion Status OK TO TRANSFUSE    Crossmatch Result Compatible    Unit Number [id number]    Blood Component Type RED CELLS,LR     Unit division 00    Status of Unit ALLOCATED    Transfusion Status OK TO TRANSFUSE    Crossmatch Result Compatible    Unit Number [id number]    Blood Component Type RED CELLS,LR    Unit division 00    Status of Unit ISSUED    Transfusion Status OK TO TRANSFUSE    Crossmatch Result Compatible    Unit Number [id number]    Blood Component Type RED CELLS,LR    Unit division 00    Status of Unit ALLOCATED    Transfusion Status OK TO TRANSFUSE    Crossmatch Result Compatible   Lipase, blood     Status: Abnormal   Collection Time: 01/01/16 10:31 AM  Result Value Ref Range   Lipase <10 (L) 11 - 51 U/L    Comment: REPEATED TO VERIFY  Differential     Status: Abnormal   Collection Time: 01/01/16 10:45 AM  Result Value Ref Range   Neutrophils Relative % 91 %   Neutro Abs 10.5 (H) 1.7 - 7.7 K/uL   Lymphocytes Relative 7 %   Lymphs Abs 0.8 0.7 - 4.0 K/uL   Monocytes Relative 2 %   Monocytes Absolute 0.3 0.1 - 1.0 K/uL   Eosinophils Relative 0 %   Eosinophils Absolute 0.0 0.0 - 0.7 K/uL   Basophils Relative 0 %   Basophils Absolute 0.0 0.0 - 0.1 K/uL  hCG, serum, qualitative     Status: None   Collection Time: 01/01/16 11:05 AM  Result Value Ref Range   Preg, Serum NEGATIVE NEGATIVE    Comment:        THE SENSITIVITY OF THIS METHODOLOGY IS >10 mIU/mL.   POC occult blood, ED     Status: Abnormal   Collection Time: 01/01/16 11:37 AM  Result Value Ref Range   Fecal Occult Bld POSITIVE (A) NEGATIVE  Prepare RBC     Status: None   Collection Time: 01/01/16 12:32 PM  Result Value Ref Range   Order Confirmation ORDER PROCESSED BY BLOOD BANK   Prepare RBC     Status: None   Collection Time: 01/01/16  1:23 PM  Result Value Ref Range   Order Confirmation      ORDER PROCESSED BY BLOOD BANK BB SAMPLE OR UNITS ALREADY AVAILABLE    No results found.  ROS  negative except above Blood pressure 108/67, pulse 84, temperature 98.5 F (36.9 C), temperature source Oral, resp. rate 14,  height 5' 5"  (1.651 m), weight 65.772 kg (145 lb), last menstrual period 12/01/2015, SpO2 100 %. Physical Exam vital signs stable afebrile no acute distress lying comfortably in the bed lungs are clear heart regular rate and rhythm abdomen is soft nontender minimal pedal edema labs reviewed hemoglobin 3 normal BUN decreased MCV liver tests normal  Assessment/Plan: GI bleed probably secondary to ibuprofen Plan: The risks benefits methods of endoscopy was discussed with the patient and we compared it to the colonoscopy she had 2 years ago and will proceed tomorrow at 11 AM unless signs of active bleeding and please call me when necessary otherwise will allow clear liquids only today and continue IV Protonix  Navreet Bolda E 01/01/2016, 1:30 PM

## 2016-01-02 ENCOUNTER — Observation Stay (HOSPITAL_COMMUNITY): Payer: Self-pay | Admitting: Anesthesiology

## 2016-01-02 ENCOUNTER — Encounter (HOSPITAL_COMMUNITY): Payer: Self-pay

## 2016-01-02 ENCOUNTER — Encounter (HOSPITAL_COMMUNITY)
Admission: EM | Disposition: A | Discharge status: Home / Self Care | Payer: Self-pay | Source: Home / Self Care | Attending: Internal Medicine

## 2016-01-02 DIAGNOSIS — Z9013 Acquired absence of bilateral breasts and nipples: Secondary | ICD-10-CM

## 2016-01-02 DIAGNOSIS — K760 Fatty (change of) liver, not elsewhere classified: Secondary | ICD-10-CM

## 2016-01-02 DIAGNOSIS — Z9884 Bariatric surgery status: Secondary | ICD-10-CM

## 2016-01-02 DIAGNOSIS — D500 Iron deficiency anemia secondary to blood loss (chronic): Secondary | ICD-10-CM

## 2016-01-02 DIAGNOSIS — K922 Gastrointestinal hemorrhage, unspecified: Secondary | ICD-10-CM

## 2016-01-02 DIAGNOSIS — D62 Acute posthemorrhagic anemia: Secondary | ICD-10-CM

## 2016-01-02 HISTORY — PX: ESOPHAGOGASTRODUODENOSCOPY: SHX5428

## 2016-01-02 LAB — H PYLORI, IGM, IGG, IGA AB
H Pylori IgG: <0.9 U/mL (ref 0.0–0.8)
H. Pylogi, Igm Abs: <9 units (ref 0.0–8.9)

## 2016-01-02 LAB — BASIC METABOLIC PANEL
ANION GAP: 6 (ref 5–15)
BUN: 8 mg/dL (ref 6–20)
CHLORIDE: 111 mmol/L (ref 101–111)
CO2: 24 mmol/L (ref 22–32)
Calcium: 7.4 mg/dL — ABNORMAL LOW (ref 8.9–10.3)
Creatinine, Ser: 0.61 mg/dL (ref 0.44–1.00)
GFR calc non Af Amer: >60 mL/min (ref >60)
Glucose, Bld: 76 mg/dL (ref 65–99)
Potassium: 3.9 mmol/L (ref 3.5–5.1)
Sodium: 141 mmol/L (ref 135–145)

## 2016-01-02 LAB — CBC
HCT: 25.6 % — ABNORMAL LOW (ref 36.0–46.0)
HEMOGLOBIN: 8.8 g/dL — AB (ref 12.0–15.0)
MCH: 28.1 pg (ref 26.0–34.0)
MCHC: 34.4 g/dL (ref 30.0–36.0)
MCV: 81.8 fL (ref 78.0–100.0)
Platelets: 256 10*3/uL (ref 150–400)
RBC: 3.13 MIL/uL — AB (ref 3.87–5.11)
RDW: 17 % — ABNORMAL HIGH (ref 11.5–15.5)
WBC: 7.9 10*3/uL (ref 4.0–10.5)

## 2016-01-02 SURGERY — EGD (ESOPHAGOGASTRODUODENOSCOPY)
Anesthesia: Monitor Anesthesia Care

## 2016-01-02 MED ORDER — PROPOFOL 10 MG/ML IV BOLUS
INTRAVENOUS | Status: DC | PRN
  Administered 2016-01-02 (×2): 25 mg via INTRAVENOUS

## 2016-01-02 MED ORDER — LACTATED RINGERS IV SOLN
INTRAVENOUS | Status: DC | PRN
  Administered 2016-01-02: 10:00:00 via INTRAVENOUS

## 2016-01-02 MED ORDER — PROPOFOL 500 MG/50ML IV EMUL
INTRAVENOUS | Status: DC | PRN
  Administered 2016-01-02: 150 ug/kg/min via INTRAVENOUS

## 2016-01-02 MED ORDER — SUCRALFATE 1 GM/10ML PO SUSP
1.0000 g | Freq: Three times a day (TID) | ORAL | Status: DC
  Administered 2016-01-02 – 2016-01-03 (×5): 1 g via ORAL
  Filled 2016-01-02 (×5): qty 10

## 2016-01-02 MED ORDER — VITAMIN B-1 100 MG PO TABS
100.0000 mg | ORAL_TABLET | Freq: Every day | ORAL | Status: DC
  Administered 2016-01-02 – 2016-01-03 (×2): 100 mg via ORAL
  Filled 2016-01-02 (×2): qty 1

## 2016-01-02 MED ORDER — PANTOPRAZOLE SODIUM 40 MG PO TBEC
40.0000 mg | DELAYED_RELEASE_TABLET | Freq: Every day | ORAL | Status: DC
  Administered 2016-01-02 – 2016-01-03 (×2): 40 mg via ORAL
  Filled 2016-01-02 (×2): qty 1

## 2016-01-02 MED ORDER — FOLIC ACID 1 MG PO TABS
1.0000 mg | ORAL_TABLET | Freq: Every day | ORAL | Status: DC
  Administered 2016-01-02 – 2016-01-03 (×2): 1 mg via ORAL
  Filled 2016-01-02 (×2): qty 1

## 2016-01-02 NOTE — Anesthesia Preprocedure Evaluation (Signed)
Anesthesia Evaluation  Patient identified by MRN, date of birth, ID band Patient awake    Reviewed: Allergy & Precautions, H&P , NPO status , Patient's Chart, lab work & pertinent test results  Airway Mallampati: II  TM Distance: >3 FB Neck ROM: Full    Dental  (+) Teeth Intact, Dental Advisory Given   Pulmonary    breath sounds clear to auscultation       Cardiovascular  Rhythm:Regular Rate:Normal     Neuro/Psych    GI/Hepatic   Endo/Other    Renal/GU      Musculoskeletal   Abdominal   Peds  Hematology   Anesthesia Other Findings   Reproductive/Obstetrics                             Anesthesia Physical  Anesthesia Plan  ASA: II  Anesthesia Plan: MAC   Post-op Pain Management:    Induction: Intravenous  Airway Management Planned: Nasal Cannula  Additional Equipment:   Intra-op Plan:   Post-operative Plan:   Informed Consent: I have reviewed the patients History and Physical, chart, labs and discussed the procedure including the risks, benefits and alternatives for the proposed anesthesia with the patient or authorized representative who has indicated his/her understanding and acceptance.   Dental advisory given  Plan Discussed with: CRNA and Anesthesiologist  Anesthesia Plan Comments:         Anesthesia Quick Evaluation

## 2016-01-02 NOTE — Transfer of Care (Signed)
Immediate Anesthesia Transfer of Care Note  Patient: Cheryl Holloway  Procedure(s) Performed: Procedure(s): ESOPHAGOGASTRODUODENOSCOPY (EGD) (N/A)  Patient Location: PACU, Radiology and Endoscopy Unit  Anesthesia Type:MAC  Level of Consciousness: awake, alert  and oriented  Airway & Oxygen Therapy: Patient Spontanous Breathing and Patient connected to nasal cannula oxygen  Post-op Assessment: Report given to RN and Post -op Vital signs reviewed and stable  Post vital signs: Reviewed and stable  Last Vitals:  Filed Vitals:   01/02/16 0800 01/02/16 0935  BP: 126/85 139/87  Pulse: 76 67  Temp: 37.2 C 37.3 C  Resp: 15 10    Last Pain:  Filed Vitals:   01/02/16 1005  PainSc: 0-No pain         Complications: No apparent anesthesia complications

## 2016-01-02 NOTE — Progress Notes (Signed)
Cheryl Holloway 10:20 AM  Subjective: Patient with a bout of bloody stools this morning but no other complaints  Objective: Vital signs stable afebrile no acute distress exam please see preassessment evaluation hemoglobin increased posttransfusion BUN normal  Assessment: GI bleeding in patient with history of gastric bypass on nonsteroidals  Plan: Okay to proceed with endoscopy with anesthesia assistance  [facility] E  Pager [phone] After 5PM or if no answer call [phone]

## 2016-01-02 NOTE — Progress Notes (Signed)
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 Marko Plume), being evaluated for menorrhagia by GYN and supposed to have some form of biopsy soon, gastric bypass done in [place] several years ago, PUD, unintentional weight loss and diarrhea suspected from dumping syndrome, fatty liver, chronic microcytic/iron deficiency anemia, fibroids presented to [facility] ED on 5/18 with complaints of epigastric pain, 2-3 days history of melena and bright red blood in stools. She had been taking OTC NSAIDs for dental pain. In the ED, hemoglobin 3.7 which improved to 8.8 after 3 units PRBC transfusion. [facility] consulted and performed EGD-results as below. Improving.  Assessment & Plan:   Principal Problem:   Acute GI bleeding Active Problems:   Breast cancer, right (HCC)   Gastric bypass status for obesity   Iron deficiency anemia due to chronic blood loss   S/P mastectomy   Acute blood loss anemia   Unintentional weight loss   Fatty liver   UGIB (upper gastrointestinal bleed)  Acute upper GI bleeding - Secondary to jejunal ulcer - [facility] was consulted. Treated with IV PPI infusion. Underwent EGD which showed 1 nonbleeding jejunal ulcer 10 cm past the gastrojejunal anastomosis with no stigmata of bleeding. - Diet advanced to soft diet post EGD. Patient counseled regarding no aspirin, NSAIDs. - Await pathology results. - Follow up in GI clinic in 4 weeks.  Acute posthemorrhagic anemia complicating chronic iron deficiency anemia - Her chronic anemia is related to menorrhagia - Acute blood loss anemia related to upper GI bleed - Presented with hemoglobin of 3.7. - Hemoglobin improved to 8.8 after 3 units PRBC transfusion. Follow CBCs closely  and transfuse if hemoglobin <7 g per DL. - Baseline hemoglobin apparently at 10 g per DL.  Status post gastric bypass - Avoid NSAIDs. Continue PPI for now. - Followed by GI for chronic recurrent diarrhea likely related to gastric bypass procedure i.e. dumping syndrome.  Breast cancer status post bilateral mastectomies - As per patient, has been off of tamoxifen for past few weeks. Follows with oncology.  Fatty liver   DVT prophylaxis:  SCDs Code Status:  Full Family Communication:  [facility] Disposition Plan:  Admitted to stepdown. Continue monitoring in stepdown unit for additional 24 hours. Possible DC in 1-2 days.   Consultants:   [facility]  Procedures:   EGD 01/02/16: Impression: - Normal esophagus.  - Gastric bypass with a small-sized pouch and   intact staple line. Gastrojejunal anastomosis   characterized by healthy appearing mucosa.  - One non-bleeding jejunal ulcer 10 cm past the   anastomosis with no stigmata of bleeding Biopsied.  - Non-bleeding jejunal diverticulum.  - Normal examined jejunum.  - The examination was otherwise normal.  Recommendation: - Return patient to hospital ward for ongoing care.  - Soft diet today.  - Continue present medications.  - Await pathology results.  - Return to GI clinic in 4 weeks.  - Telephone GI clinic for pathology results in 1   week.  - Telephone GI clinic if symptomatic PRN.  - No aspirin, ibuprofen, naproxen, or other   non-steroidal anti-inflammatory drugs.  Antimicrobials:     None    Subjective: She was seen this morning prior to EGD. Felt better. 1 BM overnight with black stools and blood  stained water. No nausea or vomiting. Denied abdominal pain. No dizziness, lightheadedness, chest pain or dyspnea. As per RN, no acute issues.  Objective:  Filed Vitals:   01/02/16 1100 01/02/16 1110 01/02/16 1200 01/02/16 1400  BP:  125/81 131/88 148/95  Pulse:  64    Temp:  98.7 F (37.1 C)    TempSrc:  Oral    Resp: 20 13 15 20   Height:      Weight:      SpO2:  98% 99% 100%    Intake/Output Summary (Last 24 hours) at 01/02/16 1439 Last data filed at 01/02/16 1300  Gross per 24 hour  Intake 2118.75 ml  Output      1 ml  Net 2117.75 ml   Filed Weights   01/01/16 1008 01/01/16 1830  Weight: 65.772 kg (145 lb) 65.772 kg (145 lb)    Examination:  General exam: Pleasant young female lying comfortably supine in bed. Respiratory system: Clear to auscultation. Respiratory effort normal. Cardiovascular system: S1 & S2 heard, RRR. No JVD, murmurs, rubs, gallops or clicks. No pedal edema. Telemetry: Sinus rhythm. Gastrointestinal system: Abdomen is nondistended, soft and nontender. No organomegaly or masses felt. Normal bowel sounds heard. Central nervous system: Alert and oriented. No focal neurological deficits. Extremities: Symmetric 5 x 5 power. Skin: No rashes, lesions or ulcers Psychiatry: Judgement and insight appear normal. Mood & affect appropriate.     Data Reviewed: I have personally reviewed following labs and imaging studies  CBC:  Recent Labs Lab 01/01/16 1031 01/01/16 1045 01/02/16 0444  WBC 12.0*  --  7.9  NEUTROABS  --  10.5*  --   HGB 3.7*  --  8.8*  HCT 11.3*  --  25.6*  MCV 75.8*  --  81.8  PLT 318  --  123456   Basic Metabolic Panel:  Recent Labs Lab 01/01/16 1031 01/02/16 0444  NA 138 141  K 4.0 3.9  CL 108 111  CO2 20* 24  GLUCOSE 170* 76  BUN 11 8  CREATININE 0.78 0.61  CALCIUM 7.3* 7.4*   GFR: Estimated  Creatinine Clearance: 85 mL/min (by C-G formula based on Cr of 0.61). Liver Function Tests:  Recent Labs Lab 01/01/16 1031  AST 36  ALT 21  ALKPHOS 63  BILITOT 0.2*  PROT 4.0*  ALBUMIN 1.7*    Recent Labs Lab 01/01/16 1031  LIPASE <10*   No results for input(s): AMMONIA in the last 168 hours. Coagulation Profile: No results for input(s): INR, PROTIME in the last 168 hours. Cardiac Enzymes: No results for input(s): CKTOTAL, CKMB, CKMBINDEX, TROPONINI in the last 168 hours. BNP (last 3 results) No results for input(s): PROBNP in the last 8760 hours. HbA1C: No results for input(s): HGBA1C in the last 72 hours. CBG: No results for input(s): GLUCAP in the last 168 hours. Lipid Profile: No results for input(s): CHOL, HDL, LDLCALC, TRIG, CHOLHDL, LDLDIRECT in the last 72 hours. Thyroid Function Tests: No results for input(s): TSH, T4TOTAL, FREET4, T3FREE, THYROIDAB in the last 72 hours. Anemia Panel:  Recent Labs  01/01/16 1319  VITAMINB12 689  FOLATE 41.4  FERRITIN 42  TIBC 122*  IRON 42  RETICCTPCT 3.9*    Sepsis Labs: No results for input(s): PROCALCITON, LATICACIDVEN in the last 168 hours.  Recent Results (from the past 240 hour(s))  MRSA PCR Screening     Status: None   Collection Time: 01/01/16  6:17 PM  Result Value Ref Range Status   MRSA by PCR NEGATIVE  NEGATIVE Final    Comment:        The GeneXpert MRSA Assay (FDA approved for NASAL specimens only), is one component of a comprehensive MRSA colonization surveillance program. It is not intended to diagnose MRSA infection nor to guide or monitor treatment for MRSA infections.          Radiology Studies: No results found.      Scheduled Meds: . folic acid  1 mg Oral Daily  . pantoprazole  40 mg Oral Daily  . sodium chloride flush  3 mL Intravenous Q12H  . sucralfate  1 g Oral TID WC & HS  . thiamine  100 mg Oral Daily   Continuous Infusions:        Time spent: 30  minutes.    [facility], MD [facility] Pager [phone]  If 7PM-7AM, please contact night-coverage [email or web address] Password [facility] 01/02/2016, 2:39 PM

## 2016-01-02 NOTE — Procedures (Addendum)
Once a day pump inhibitor should be fine because patient may not make any acid from her previous surgery and using Carafate might be helpful and avoiding aspirin and nonsteroidals long-term and hopefully patient can go home soon and follow-up with [place] in the office on the phone for the biopsies next week and if doing well in one month to recheck labs and symptoms and make sure no further workup and plans are needed and will need to consider repeat endoscopy to document healing at some point

## 2016-01-02 NOTE — Op Note (Signed)
[facility] Patient Name: Cheryl Holloway Procedure Date : 01/02/2016 MRN: [id number] Attending MD: Clarene Essex , MD Date of Birth: Dec 28, 1975 CSN: [id number] Age: 40 Admit Type: Inpatient Procedure:                Upper GI endoscopy Indications:              Acute post hemorrhagic anemia, Melena Providers:                Clarene Essex, MD, Cleda Daub, RN, Elspeth Cho,                            Technician Referring MD:              Medicines:                Propofol total dose 250 mg IV50 mg IV lidocaine Complications:            No immediate complications. Estimated Blood Loss:     Estimated blood loss: none. Procedure:                Pre-Anesthesia Assessment:                           - Prior to the procedure, a History and Physical                            was performed, and patient medications and                            allergies were reviewed. The patient's tolerance of                            previous anesthesia was also reviewed. The risks                            and benefits of the procedure and the sedation                            options and risks were discussed with the patient.                            All questions were answered, and informed consent                            was obtained. Prior Anticoagulants: The patient has                            taken ibuprofen, last dose was 1 day prior to                            procedure. ASA Grade Assessment: II - A patient                            with mild systemic disease. After reviewing the  risks and benefits, the patient was deemed in                            satisfactory condition to undergo the procedure.                           After obtaining informed consent, the endoscope was                            passed under direct vision. Throughout the                            procedure, the patient's blood pressure, pulse, and       oxygen saturations were monitored continuously. The                            [id number]) scope was introduced through the                            mouth, and advanced to the afferent jejunal loop.                            The upper GI endoscopy was accomplished without                            difficulty. The patient tolerated the procedure                            well. Scope In: Scope Out: Findings:      The examined esophagus was normal.      Evidence of a gastric bypass was found. A gastric pouch with a small       size was found containing staples. The staple line appeared intact. The       gastrojejunal anastomosis was characterized by healthy appearing mucosa.       This could not be traversed.      One non-bleeding cratered and linear ulcer with no stigmata of bleeding       was found in the jejunum10 cm from the anastomosis with some abnormal       folds just distal questionably another anastomosis but hard to know.       Biopsies were taken with a cold forceps for histology.      A diminutive non-bleeding diverticulum was found in the jejunum just       proximal to the ulcer.      The examined jejunum and possible proximal ileum was normal any exam       another 40 cm past the ulcer but unable to advance any further due to       looping.      The exam was otherwise without abnormality. Impression:               - Normal esophagus.                           - Gastric bypass with a small-sized pouch and  intact staple line. Gastrojejunal anastomosis                            characterized by healthy appearing mucosa.                           - One non-bleeding jejunal ulcer 10 cm past the                            anastomosis with no stigmata of bleeding Biopsied.                           - Non-bleeding jejunal diverticulum.                           - Normal examined jejunum.                           - The examination was  otherwise normal. Moderate Sedation:      moderate sedation-none Recommendation:           - Return patient to hospital ward for ongoing care.                           - Soft diet today.                           - Continue present medications.                           - Await pathology results.                           - Return to GI clinic in 4 weeks.                           - Telephone GI clinic for pathology results in 1                            week.                           - Telephone GI clinic if symptomatic PRN.                           - No aspirin, ibuprofen, naproxen, or other                            non-steroidal anti-inflammatory drugs. Procedure Code(s):        --- Professional ---                           [phone], Esophagogastroduodenoscopy, flexible,                            transoral; with biopsy, single or multiple Diagnosis Code(s):        --- Professional ---  Z98.84, Bariatric surgery status                           K28.9, Gastrojejunal ulcer, unspecified as acute or                            chronic, without hemorrhage or perforation                           D62, Acute posthemorrhagic anemia                           K92.1, Melena (includes Hematochezia)                           K57.10, Diverticulosis of small intestine without                            perforation or abscess without bleeding CPT copyright 2016 American Medical Association. All rights reserved. The codes documented in this report are preliminary and upon coder review may  be revised to meet current compliance requirements. Clarene Essex, MD 01/02/2016 10:55:28 AM This report has been signed electronically. Number of Addenda: 0

## 2016-01-02 NOTE — Anesthesia Postprocedure Evaluation (Signed)
Anesthesia Post Note  Patient: Recruitment consultant  Procedure(s) Performed: Procedure(s) (LRB): ESOPHAGOGASTRODUODENOSCOPY (EGD) (N/A)  Patient location during evaluation: PACU Anesthesia Type: MAC Level of consciousness: awake and alert Pain management: pain level controlled Vital Signs Assessment: post-procedure vital signs reviewed and stable Respiratory status: spontaneous breathing, nonlabored ventilation, respiratory function stable and patient connected to nasal cannula oxygen Cardiovascular status: stable and blood pressure returned to baseline Anesthetic complications: no    Last Vitals:  Filed Vitals:   01/02/16 1100 01/02/16 1110  BP:  125/81  Pulse:  64  Temp:  37.1 C  Resp: 20 13    Last Pain:  Filed Vitals:   01/02/16 1137  PainSc: 0-No pain                 Zenaida Deed

## 2016-01-03 LAB — CBC
HCT: 25.5 % — ABNORMAL LOW (ref 36.0–46.0)
HCT: 33.8 % — ABNORMAL LOW (ref 36.0–46.0)
Hemoglobin: 8.7 g/dL — ABNORMAL LOW (ref 12.0–15.0)
Hemoglobin: 11.2 g/dL — ABNORMAL LOW (ref 12.0–15.0)
MCH: 27.9 pg (ref 26.0–34.0)
MCH: 27.6 pg (ref 26.0–34.0)
MCHC: 34.1 g/dL (ref 30.0–36.0)
MCHC: 33.1 g/dL (ref 30.0–36.0)
MCV: 81.7 fL (ref 78.0–100.0)
MCV: 83.3 fL (ref 78.0–100.0)
PLATELETS: 279 10*3/uL (ref 150–400)
PLATELETS: 329 10*3/uL (ref 150–400)
RBC: 3.12 MIL/uL — ABNORMAL LOW (ref 3.87–5.11)
RBC: 4.06 MIL/uL (ref 3.87–5.11)
RDW: 17.1 % — AB (ref 11.5–15.5)
RDW: 17.6 % — ABNORMAL HIGH (ref 11.5–15.5)
WBC: 7.9 10*3/uL (ref 4.0–10.5)
WBC: 10.9 10*3/uL — ABNORMAL HIGH (ref 4.0–10.5)

## 2016-01-03 MED ORDER — PANTOPRAZOLE SODIUM 40 MG PO TBEC: 40.0000 mg | DELAYED_RELEASE_TABLET | Freq: Every day | ORAL | Status: DC

## 2016-01-03 MED ORDER — SUCRALFATE 1 GM/10ML PO SUSP: 1.0000 g | Freq: Three times a day (TID) | ORAL | Status: DC

## 2016-01-03 NOTE — Discharge Instructions (Signed)

## 2016-01-03 NOTE — Discharge Summary (Signed)
Physician Discharge Summary  Cheryl Holloway  [id number]  DOB: March 04, 1976  DOA: 01/01/2016  PCP: Wenda Low, MD  Admit date: 01/01/2016 Discharge date: 01/03/2016  Time spent: Greater than 30 minutes  Recommendations for Outpatient Follow-up:  1. Dr. Wenda Low, PCP in 5 days with repeat labs (CBC & BMP). 2. Dr. Wilford Corner, [facility] in 2 weeks.  Discharge Diagnoses:  Principal Problem:   Acute GI bleeding Active Problems:   Breast cancer, right (HCC)   Gastric bypass status for obesity   Iron deficiency anemia due to chronic blood loss   S/P mastectomy   Acute blood loss anemia   Unintentional weight loss   Fatty liver   UGIB (upper gastrointestinal bleed)   Discharge Condition: Improved & Stable  Diet recommendation: Heart healthy diet.  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 Marko Plume), being evaluated for menorrhagia by GYN and supposed to have some form of biopsy soon, gastric bypass done in [place] several years ago, PUD, unintentional weight loss and diarrhea suspected from dumping syndrome, fatty liver, chronic microcytic/iron deficiency anemia, fibroids presented to [facility] ED on 5/18 with complaints of epigastric pain, 2-3 days history of melena and bright red blood in stools. She had been taking OTC NSAIDs for dental pain. In the ED, hemoglobin 3.7 which improved to 8.8 after 3 units PRBC transfusion. [facility] consulted and performed EGD-results as below. Clinically improved. No clinically active bleeding.    Hospital Course:   Acute upper GI bleeding - Secondary to jejunal ulcer - [facility] was consulted. Treated with IV PPI infusion. Underwent EGD which showed 1 nonbleeding  jejunal ulcer 10 cm past the gastrojejunal anastomosis with no stigmata of bleeding. - Diet advanced to soft diet post EGD. Patient counseled regarding no aspirin, NSAIDs, BC or Goody's powder. She was counseled that she may use acetaminophen for pain but if she needs something stronger, she will have to follow up with her PCP regarding management. - Await pathology results-can follow-up with primary GI as outpatient. - As per GI recommendations, continue PPI and sucralfate. Discussed with Dr. Oletta Lamas. Okay to discharge home.  Acute posthemorrhagic anemia complicating chronic iron deficiency anemia - Her chronic anemia is related to menorrhagia - Acute blood loss anemia related to upper GI bleed - Presented with hemoglobin of 3.7. - Hemoglobin improved to 8.7-8.8 g per DL range yesterday. Hemoglobin this morning: 11.2 which may not be entirely accurate (did not receive any additional blood since last night for hemoglobin to go up from 8.7 > 11.2) but feel that she has had no clinically active bleeding and hemoglobin is stable. This can be checked during outpatient follow-up in the next few days with PCP visit. - Baseline hemoglobin apparently at 8-10 g per DL. - Anemia panel: Iron 42, TIBC 122, saturation ratio 34, ferritin 42, folate 41.4, B12: 689 & reticulocytes 59.7.  Status post gastric bypass - Avoid NSAIDs. Continue PPI for now. - Followed by GI for chronic recurrent diarrhea likely related to gastric bypass procedure i.e. dumping syndrome.  Breast cancer status post bilateral mastectomies - As per patient, has been off of tamoxifen for past few weeks. Follows with oncology.  Fatty liver  Menorrhagia - Follows with GYN as outpatient and  supposed to have some form of biopsy. Continue outpatient follow-up with GYN.   Consultants:   [facility]  Procedures:   EGD 01/02/16: Impression: - Normal esophagus.  - Gastric bypass with a small-sized  pouch and   intact staple line. Gastrojejunal anastomosis   characterized by healthy appearing mucosa.  - One non-bleeding jejunal ulcer 10 cm past the   anastomosis with no stigmata of bleeding Biopsied.  - Non-bleeding jejunal diverticulum.  - Normal examined jejunum.  - The examination was otherwise normal.  Recommendation: - Return patient to hospital ward for ongoing care.  - Soft diet today.  - Continue present medications.  - Await pathology results.  - Return to GI clinic in 4 weeks.  - Telephone GI clinic for pathology results in 1   week.  - Telephone GI clinic if symptomatic PRN.  - No aspirin, ibuprofen, naproxen, or other   non-steroidal anti-inflammatory drugs.  Discharge Exam:  Complaints: Had 1 dark stool before 2 PM on 5/19 and none since. No chest pain, nausea, vomiting, dizziness, lightheadedness or dyspnea. Denies any complaints. As per RN, no acute issues.  Filed Vitals:   01/03/16 0014 01/03/16 0310 01/03/16 0800 01/03/16 0827  BP: 122/88 138/95 127/87 132/84  Pulse: 74 71 72 75  Temp: 97.5 F (36.4 C) 98.5 F (36.9 C)  98.6 F (37 C)  TempSrc: Oral Oral  Oral  Resp: 13 20 13 14   Height:      Weight:      SpO2: 100% 100% 100% 100%    General exam: Pleasant young female lying comfortably supine in bed. Respiratory system: Clear to auscultation. Respiratory effort normal. Cardiovascular system: S1 & S2 heard, RRR. No JVD, murmurs, rubs, gallops or clicks. No pedal edema. Telemetry: Sinus rhythm. Gastrointestinal system: Abdomen is nondistended,  soft and nontender. No organomegaly or masses felt. Normal bowel sounds heard. Central nervous system: Alert and oriented. No focal neurological deficits. Extremities: Symmetric 5 x 5 power. Skin: No rashes, lesions or ulcers Psychiatry: Judgement and insight appear normal. Mood & affect appropriate.   Discharge Instructions      Discharge Instructions    Call MD for:  difficulty breathing, headache or visual disturbances    Complete by:  As directed      Call MD for:  extreme fatigue    Complete by:  As directed      Call MD for:  persistant dizziness or light-headedness    Complete by:  As directed      Call MD for:  severe uncontrolled pain    Complete by:  As directed      Call MD for:    Complete by:  As directed   Persistent or recurrent black tarry stools, blood in stools, vomiting blood or coffee-ground material.     Diet - low sodium heart healthy    Complete by:  As directed      Increase activity slowly    Complete by:  As directed             Medication List    STOP taking these medications        ADVIL PM 200-25 MG Caps  Generic drug:  Ibuprofen-Diphenhydramine HCl      TAKE these medications        ferrous fumarate 325 (106 Fe) MG Tabs tablet  Commonly known as:  HEMOCYTE - 106 mg FE  Take 1 tablet by mouth daily.     fish oil-omega-3 fatty acids 1000  MG capsule  Take 1 g by mouth daily.     multivitamin with minerals Tabs tablet  Take 1 tablet by mouth daily.     pantoprazole 40 MG tablet  Commonly known as:  PROTONIX  Take 1 tablet (40 mg total) by mouth daily.     sucralfate 1 GM/10ML suspension  Commonly known as:  CARAFATE  Take 10 mLs (1 g total) by mouth 4 (four) times daily -  with meals and at bedtime.     tamoxifen 20 MG tablet  Commonly known as:  NOLVADEX  Take 1 tablet (20 mg total) by mouth daily.     vitamin B-12 500 MCG tablet  Commonly known as:  CYANOCOBALAMIN  Take 500 mcg by mouth daily.     VITAMIN E PO  Take 400  Units by mouth daily.       Follow-up Information    Follow up with SCHOOLER,VINCENT C., MD. Schedule an appointment as soon as possible for a visit in 2 weeks.   Specialty:  Gastroenterology   Contact information:   [id number] [place] [address] [phone]       Follow up with Wenda Low, MD. Schedule an appointment as soon as possible for a visit in 5 days.   Specialty:  Internal Medicine   Why:  To be seen with repeat labs (CBC & BMP).   Contact information:   [address] [place] [address] [phone]       Get Medicines reviewed and adjusted: Please take all your medications with you for your next visit with your Primary MD  Please request your Primary MD to go over all hospital tests and procedure/radiological results at the follow up. Please ask your Primary MD to get all Hospital records sent to his/her office.  If you experience worsening of your admission symptoms, develop shortness of breath, life threatening emergency, suicidal or homicidal thoughts you must seek medical attention immediately by calling 911 or calling your MD immediately if symptoms less severe.  You must read complete instructions/literature along with all the possible adverse reactions/side effects for all the Medicines you take and that have been prescribed to you. Take any new Medicines after you have completely understood and accept all the possible adverse reactions/side effects.   Do not drive when taking pain medications.   Do not take more than prescribed Pain, Sleep and Anxiety Medications  Special Instructions: If you have smoked or chewed Tobacco in the last 2 yrs please stop smoking, stop any regular Alcohol and or any Recreational drug use.  Wear Seat belts while driving.  Please note  You were cared for by a hospitalist during your hospital stay. Once you are discharged, your primary care physician will handle any further medical  issues. Please note that NO REFILLS for any discharge medications will be authorized once you are discharged, as it is imperative that you return to your primary care physician (or establish a relationship with a primary care physician if you do not have one) for your aftercare needs so that they can reassess your need for medications and monitor your lab values.    The results of significant diagnostics from this hospitalization (including imaging, microbiology, ancillary and laboratory) are listed below for reference.    Significant Diagnostic Studies: No results found.  Microbiology: Recent Results (from the past 240 hour(s))  MRSA PCR Screening     Status: None   Collection Time: 01/01/16  6:17 PM  Result Value Ref Range Status   MRSA by PCR NEGATIVE NEGATIVE Final    Comment:        The GeneXpert MRSA Assay (FDA approved for NASAL specimens only), is one component of a comprehensive MRSA colonization surveillance program. It is not intended to diagnose MRSA infection nor to guide or monitor treatment for MRSA infections.      Labs: Basic Metabolic Panel:  Recent Labs Lab 01/01/16 1031 01/02/16 0444  NA 138 141  K 4.0 3.9  CL 108 111  CO2 20* 24  GLUCOSE 170* 76  BUN 11 8  CREATININE 0.78 0.61  CALCIUM 7.3* 7.4*   Liver Function Tests:  Recent Labs Lab 01/01/16 1031  AST 36  ALT 21  ALKPHOS 63  BILITOT 0.2*  PROT 4.0*  ALBUMIN 1.7*    Recent Labs Lab 01/01/16 1031  LIPASE <10*   No results for input(s): AMMONIA in the last 168 hours. CBC:  Recent Labs Lab 01/01/16 1031 01/01/16 1045 01/02/16 0444 01/02/16 2328 01/03/16 1050  WBC 12.0*  --  7.9 7.9 10.9*  NEUTROABS  --  10.5*  --   --   --   HGB 3.7*  --  8.8* 8.7* 11.2*  HCT 11.3*  --  25.6* 25.5* 33.8*  MCV 75.8*  --  81.8 81.7 83.3  PLT 318  --  256 279 329   Cardiac Enzymes: No results for input(s): CKTOTAL, CKMB, CKMBINDEX, TROPONINI in the last 168 hours. BNP: BNP (last 3  results) No results for input(s): BNP in the last 8760 hours.  ProBNP (last 3 results) No results for input(s): PROBNP in the last 8760 hours.  CBG: No results for input(s): GLUCAP in the last 168 hours.     Signed:  Vernell Leep, MD, FACP, FHM. [facility] Pager [phone]  If 7PM-7AM, please contact night-coverage [email or web address] Password [facility] 01/03/2016, 12:17 PM

## 2016-01-03 NOTE — Progress Notes (Signed)
[facility] PROGRESS NOTE Subjective patient without further gross bleeding. EGD refill ulcer and jejunum. Patient has had previous gastric bypass.  Objective: Vital signs in last 24 hours: Temp:  [97.5 F (36.4 C)-98.7 F (37.1 C)] 98.6 F (37 C) (05/20 0827) Pulse Rate:  [71-82] 75 (05/20 0827) Resp:  [12-20] 14 (05/20 0827) BP: (122-148)/(84-95) 132/84 mmHg (05/20 0827) SpO2:  [97 %-100 %] 100 % (05/20 0827) Last BM Date: 01/02/16  Intake/Output from previous day: 05/19 0701 - 05/20 0700 In: 1280 [P.O.:1080; I.V.:200] Out: -  Intake/Output this shift: Total I/O In: 123 [P.O.:120; I.V.:3] Out: -     Lab Results:  Recent Labs  01/01/16 1031 01/02/16 0444 01/02/16 2328  WBC 12.0* 7.9 7.9  HGB 3.7* 8.8* 8.7*  HCT 11.3* 25.6* 25.5*  PLT 318 256 279   BMET  Recent Labs  01/01/16 1031 01/02/16 0444  NA 138 141  K 4.0 3.9  CL 108 111  CO2 20* 24  CREATININE 0.78 0.61   LFT  Recent Labs  01/01/16 1031  PROT 4.0*  AST 36  ALT 21  ALKPHOS 63  BILITOT 0.2*   PT/INR No results for input(s): LABPROT, INR in the last 72 hours. PANCREAS  Recent Labs  01/01/16 1031  LIPASE <10*         Studies/Results: No results found.  Medications: I have reviewed the patient's current medications.  Assessment/Plan: 1. Jejunal ulcer. She has had gastric bypass done in [place] over 10 years ago. Exact anatomy unknown but if she still has the majority of her stomach left she would be at risk for an ulcer in the remaining stomach or duodenum. She ought to remain on some type of acid reduction indefinitely. For now, I would discharge her own carafate and PPI. Dr. Joette Catching are primary G.I. Dr. and suggest that she follow up with him in 2 to 3 weeks. Please call us back if there is signs of any further bleeding.   Aaronmichael Brumbaugh JR,Nyjae Hodge L 01/03/2016, 11:17 AM  This note was created using voice recognition software. Minor errors may Have occurred  unintentionally.  Pager: [phone] If no answer or after hours call [phone]

## 2016-01-03 NOTE — Progress Notes (Signed)
Discharge patient to home in stable condition.  Discharge instructions and appointments discussed with patient with daughter in attendance.

## 2016-01-05 ENCOUNTER — Encounter (HOSPITAL_COMMUNITY): Payer: Self-pay | Admitting: Gastroenterology

## 2016-01-05 LAB — TYPE AND SCREEN
ABO/RH(D): A POS
Antibody Screen: NEGATIVE
UNIT DIVISION: 0
UNIT DIVISION: 0
Unit division: 0
Unit division: 0

## 2016-01-18 ENCOUNTER — Other Ambulatory Visit: Payer: Self-pay | Admitting: Oncology

## 2016-01-18 DIAGNOSIS — E46 Unspecified protein-calorie malnutrition: Secondary | ICD-10-CM

## 2016-01-18 DIAGNOSIS — C50911 Malignant neoplasm of unspecified site of right female breast: Secondary | ICD-10-CM

## 2016-01-19 ENCOUNTER — Telehealth: Payer: Self-pay | Admitting: Oncology

## 2016-01-19 NOTE — Telephone Encounter (Signed)
Spoke with patient to confirm nutrition appt 6/12 per LL 6/4 pof. Unable to coordinate with 6/8 visit

## 2016-01-22 ENCOUNTER — Other Ambulatory Visit: Payer: BLUE CROSS/BLUE SHIELD

## 2016-01-22 ENCOUNTER — Ambulatory Visit: Payer: BLUE CROSS/BLUE SHIELD | Admitting: Oncology

## 2016-01-26 ENCOUNTER — Encounter: Payer: Self-pay | Admitting: Nutrition

## 2016-01-26 ENCOUNTER — Encounter: Payer: BLUE CROSS/BLUE SHIELD | Admitting: Nutrition

## 2016-01-26 NOTE — Progress Notes (Signed)
Patient did not show up for nutrition appointment. 

## 2016-02-26 ENCOUNTER — Other Ambulatory Visit: Payer: BLUE CROSS/BLUE SHIELD

## 2016-02-26 ENCOUNTER — Ambulatory Visit: Payer: BLUE CROSS/BLUE SHIELD | Admitting: Oncology

## 2016-03-27 ENCOUNTER — Inpatient Hospital Stay (HOSPITAL_COMMUNITY)
Admission: EM | Admit: 2016-03-27 | Discharge: 2016-03-30 | DRG: 871 | Disposition: A | Discharge status: Home / Self Care | Payer: Self-pay | Attending: Internal Medicine | Admitting: Internal Medicine

## 2016-03-27 ENCOUNTER — Inpatient Hospital Stay (HOSPITAL_COMMUNITY): Payer: Self-pay

## 2016-03-27 ENCOUNTER — Emergency Department (HOSPITAL_COMMUNITY): Payer: Self-pay

## 2016-03-27 ENCOUNTER — Inpatient Hospital Stay (HOSPITAL_COMMUNITY): Admit: 2016-03-27 | Payer: BLUE CROSS/BLUE SHIELD

## 2016-03-27 ENCOUNTER — Encounter (HOSPITAL_COMMUNITY): Payer: Self-pay

## 2016-03-27 DIAGNOSIS — L03115 Cellulitis of right lower limb: Secondary | ICD-10-CM | POA: Diagnosis present

## 2016-03-27 DIAGNOSIS — E46 Unspecified protein-calorie malnutrition: Secondary | ICD-10-CM | POA: Diagnosis present

## 2016-03-27 DIAGNOSIS — E872 Acidosis: Secondary | ICD-10-CM

## 2016-03-27 DIAGNOSIS — A419 Sepsis, unspecified organism: Principal | ICD-10-CM | POA: Diagnosis present

## 2016-03-27 DIAGNOSIS — Z9013 Acquired absence of bilateral breasts and nipples: Secondary | ICD-10-CM

## 2016-03-27 DIAGNOSIS — L03116 Cellulitis of left lower limb: Secondary | ICD-10-CM | POA: Diagnosis present

## 2016-03-27 DIAGNOSIS — Z6824 Body mass index (BMI) 24.0-24.9, adult: Secondary | ICD-10-CM

## 2016-03-27 DIAGNOSIS — E876 Hypokalemia: Secondary | ICD-10-CM | POA: Diagnosis present

## 2016-03-27 DIAGNOSIS — R600 Localized edema: Secondary | ICD-10-CM

## 2016-03-27 DIAGNOSIS — Z853 Personal history of malignant neoplasm of breast: Secondary | ICD-10-CM

## 2016-03-27 DIAGNOSIS — R7989 Other specified abnormal findings of blood chemistry: Secondary | ICD-10-CM

## 2016-03-27 DIAGNOSIS — I2699 Other pulmonary embolism without acute cor pulmonale: Secondary | ICD-10-CM | POA: Diagnosis present

## 2016-03-27 DIAGNOSIS — Z9884 Bariatric surgery status: Secondary | ICD-10-CM

## 2016-03-27 DIAGNOSIS — R634 Abnormal weight loss: Secondary | ICD-10-CM

## 2016-03-27 DIAGNOSIS — D509 Iron deficiency anemia, unspecified: Secondary | ICD-10-CM

## 2016-03-27 LAB — CBC WITH DIFFERENTIAL/PLATELET
Basophils Absolute: 0 10*3/uL (ref 0.0–0.1)
Basophils Relative: 0 %
Eosinophils Absolute: 0 10*3/uL (ref 0.0–0.7)
Eosinophils Relative: 0 %
HCT: 24.9 % — ABNORMAL LOW (ref 36.0–46.0)
Hemoglobin: 8.2 g/dL — ABNORMAL LOW (ref 12.0–15.0)
Lymphocytes Relative: 16 %
Lymphs Abs: 1.6 10*3/uL (ref 0.7–4.0)
MCH: 24 pg — ABNORMAL LOW (ref 26.0–34.0)
MCHC: 32.9 g/dL (ref 30.0–36.0)
MCV: 72.8 fL — ABNORMAL LOW (ref 78.0–100.0)
Monocytes Absolute: 0.5 10*3/uL (ref 0.1–1.0)
Monocytes Relative: 5 %
Neutro Abs: 7.9 10*3/uL — ABNORMAL HIGH (ref 1.7–7.7)
Neutrophils Relative %: 79 %
Platelets: 440 10*3/uL — ABNORMAL HIGH (ref 150–400)
RBC: 3.42 MIL/uL — ABNORMAL LOW (ref 3.87–5.11)
RDW: 16.9 % — ABNORMAL HIGH (ref 11.5–15.5)
WBC: 10.1 10*3/uL (ref 4.0–10.5)

## 2016-03-27 LAB — I-STAT BETA HCG BLOOD, ED (MC, WL, AP ONLY): I-stat hCG, quantitative: <5 m[IU]/mL (ref <5)

## 2016-03-27 LAB — COMPREHENSIVE METABOLIC PANEL
ALBUMIN: 1.9 g/dL — AB (ref 3.5–5.0)
ALT: 42 U/L (ref 14–54)
ANION GAP: 8 (ref 5–15)
AST: 50 U/L — ABNORMAL HIGH (ref 15–41)
Alkaline Phosphatase: 120 U/L (ref 38–126)
BILIRUBIN TOTAL: 0.5 mg/dL (ref 0.3–1.2)
BUN: 7 mg/dL (ref 6–20)
CALCIUM: 7.4 mg/dL — AB (ref 8.9–10.3)
CO2: 23 mmol/L (ref 22–32)
Chloride: 102 mmol/L (ref 101–111)
Creatinine, Ser: 0.8 mg/dL (ref 0.44–1.00)
GLUCOSE: 152 mg/dL — AB (ref 65–99)
POTASSIUM: 3.8 mmol/L (ref 3.5–5.1)
Sodium: 133 mmol/L — ABNORMAL LOW (ref 135–145)
TOTAL PROTEIN: 5.2 g/dL — AB (ref 6.5–8.1)

## 2016-03-27 LAB — PROCALCITONIN: Procalcitonin: 0.15 ng/mL

## 2016-03-27 LAB — C-REACTIVE PROTEIN: CRP: <0.5 mg/dL (ref <1.0)

## 2016-03-27 LAB — I-STAT CG4 LACTIC ACID, ED
Lactic Acid, Venous: 2.54 mmol/L (ref 0.5–1.9)
Lactic Acid, Venous: 4.3 mmol/L (ref 0.5–1.9)
Lactic Acid, Venous: 2.11 mmol/L (ref 0.5–1.9)
Lactic Acid, Venous: 2.91 mmol/L (ref 0.5–1.9)

## 2016-03-27 LAB — D-DIMER, QUANTITATIVE: D-Dimer, Quant: 0.32 ug/mL-FEU (ref 0.00–0.50)

## 2016-03-27 LAB — RAPID URINE DRUG SCREEN, HOSP PERFORMED
Amphetamines: NOT DETECTED
BARBITURATES: NOT DETECTED
Benzodiazepines: NOT DETECTED
Cocaine: NOT DETECTED
Opiates: NOT DETECTED
Tetrahydrocannabinol: NOT DETECTED

## 2016-03-27 LAB — URINALYSIS, ROUTINE W REFLEX MICROSCOPIC
Bilirubin Urine: NEGATIVE
Glucose, UA: NEGATIVE mg/dL
Hgb urine dipstick: NEGATIVE
Ketones, ur: NEGATIVE mg/dL
Nitrite: NEGATIVE
Protein, ur: NEGATIVE mg/dL
Specific Gravity, Urine: 1.019 (ref 1.005–1.030)
pH: 5.5 (ref 5.0–8.0)

## 2016-03-27 LAB — URINE MICROSCOPIC-ADD ON

## 2016-03-27 LAB — SEDIMENTATION RATE: SED RATE: 4 mm/h (ref 0–22)

## 2016-03-27 MED ORDER — PIPERACILLIN-TAZOBACTAM 3.375 G IVPB
3.3750 g | Freq: Three times a day (TID) | INTRAVENOUS | Status: DC
  Administered 2016-03-28: 3.375 g via INTRAVENOUS
  Filled 2016-03-27 (×3): qty 50

## 2016-03-27 MED ORDER — SODIUM CHLORIDE 0.9 % IV BOLUS (SEPSIS)
1000.0000 mL | Freq: Once | INTRAVENOUS | Status: AC
  Administered 2016-03-27: 1000 mL via INTRAVENOUS

## 2016-03-27 MED ORDER — HYDROCORTISONE 1 % EX CREA
TOPICAL_CREAM | Freq: Two times a day (BID) | CUTANEOUS | Status: DC
  Administered 2016-03-28 (×3): 1 via TOPICAL
  Administered 2016-03-29: 10:00:00 via TOPICAL
  Administered 2016-03-29: 1 via TOPICAL
  Administered 2016-03-30: 10:00:00 via TOPICAL
  Filled 2016-03-27: qty 28

## 2016-03-27 MED ORDER — SODIUM CHLORIDE 0.9 % IV SOLN
INTRAVENOUS | Status: DC
  Administered 2016-03-27: 19:00:00 via INTRAVENOUS

## 2016-03-27 MED ORDER — IOPAMIDOL (ISOVUE-370) INJECTION 76%
INTRAVENOUS | Status: AC
  Administered 2016-03-27: 100 mL
  Filled 2016-03-27: qty 100

## 2016-03-27 MED ORDER — VANCOMYCIN HCL IN DEXTROSE 1-5 GM/200ML-% IV SOLN
1000.0000 mg | Freq: Two times a day (BID) | INTRAVENOUS | Status: DC
  Administered 2016-03-28: 1000 mg via INTRAVENOUS
  Filled 2016-03-27 (×2): qty 200

## 2016-03-27 MED ORDER — VANCOMYCIN HCL 10 G IV SOLR
1250.0000 mg | Freq: Once | INTRAVENOUS | Status: AC
  Administered 2016-03-27: 1250 mg via INTRAVENOUS
  Filled 2016-03-27: qty 1250

## 2016-03-27 MED ORDER — PIPERACILLIN-TAZOBACTAM 3.375 G IVPB 30 MIN
3.3750 g | Freq: Once | INTRAVENOUS | Status: AC
  Administered 2016-03-27: 3.375 g via INTRAVENOUS
  Filled 2016-03-27: qty 50

## 2016-03-27 NOTE — ED Notes (Signed)
Patient transported to CT 

## 2016-03-27 NOTE — ED Notes (Signed)
Code Sepsis activated @ 1715

## 2016-03-27 NOTE — H&P (Addendum)
History and Physical  Cheryl Holloway [id number] DOB: 1976/06/06 DOA: 03/27/2016  Referring physician: EDP PCP: Wenda Low, MD   Chief Complaint: legs are swollen and pain  HPI: Cheryl Holloway is a 40 y.o. 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 Marko Plume), gastric bypass done in [place] several years ago, unintentional weight loss and diarrhea suspected from dumping syndrome, fatty liver, chronic microcytic/iron deficiency anemia, h/o GI bleed due to jejunal ulcer required hospitalization in 12/2015, she presented to the ED due to progressive lower extremity edema in the last few months and worsening in the last 2-3 weeks, she also report progressive skin pigmentation of the lower extremity. She report she was given diuretics in the past for the edema.   ED course, she has low grade temp of 100, she has sinus tachycardia in the 110's, blood pressure stable, she is on room air.  She has bilateral lower extremity edema on exam, left worse than right, Wbc 10, hgb 8, platelet 440, ns 133, cr 0.8, albumin 1.9, ast 50, lactic acid elevated 2.5 to 4.3, cxr no acute findings, ua no infection, EDP started sepsis protocol , she is given 2liters of ns and started on  vanc/zosyn , hospitalist called to admit the patient for cellulitis.   Patient denies chest pain, no sob,no DOE,  she report baseline active, she works in [employer] cabin crew. She report she has trouble to gain weight and she has chronic diarrhea.  Review of Systems:  Detail per HPI, Review of systems are otherwise negative  Past Medical History:  Diagnosis Date  . Anemia    takes iron  . Breast cancer ([place])   . Fibroids    uterine   Past Surgical History:  Procedure Laterality Date  . BREAST IMPLANT REMOVAL Right 04/25/2014   Procedure: REMOVAL BREAST IMPLANT WITH CAPSULECTOMY;  Surgeon:  Theodoro Kos, DO;  Location: [place];  Service: Plastics;  Laterality: Right;  . BREAST RECONSTRUCTION    . BREAST REDUCTION SURGERY    . CESAREAN SECTION     x3  . ESOPHAGOGASTRODUODENOSCOPY N/A 01/02/2016   Procedure: ESOPHAGOGASTRODUODENOSCOPY (EGD);  Surgeon: Clarene Essex, MD;  Location: [facility] ENDOSCOPY;  Service: Endoscopy;  Laterality: N/A;  . GASTRIC BYPASS    . MASTECTOMY     right  . SIMPLE MASTECTOMY WITH AXILLARY SENTINEL NODE BIOPSY Left 04/25/2014   Procedure: LEFT TOTAL  MASTECTOMY (PROPHYLACTIC);  Surgeon: Rolm Bookbinder, MD;  Location: [place];  Service: General;  Laterality: Left;  . TUBAL LIGATION     Social History:  reports that she has never smoked. She has never used smokeless tobacco. She reports that she drinks alcohol. She reports that she does not use drugs. Patient lives at home & is able to participate in activities of daily living independently   No Known Allergies  No family history on file.    Prior to Admission medications   Medication Sig Start Date End Date Taking? Authorizing Provider  diphenhydrAMINE-zinc acetate (BENADRYL) cream Apply 1 application topically 3 (three) times daily as needed for itching.   Yes Historical Provider, MD  ferrous fumarate (HEMOCYTE - 106 MG FE) 325 (106 FE) MG TABS tablet Take 1 tablet by mouth daily.   Yes Historical Provider, MD  fish oil-omega-3 fatty acids 1000 MG capsule Take 1 g by mouth daily.   Yes Historical Provider, MD  Multiple Vitamin (  MULTIVITAMIN WITH MINERALS) TABS Take 1 tablet by mouth daily.   Yes Historical Provider, MD  pantoprazole (PROTONIX) 40 MG tablet Take 1 tablet (40 mg total) by mouth daily. 01/03/16  Yes Modena Jansky, MD  vitamin B-12 (CYANOCOBALAMIN) 500 MCG tablet Take 500 mcg by mouth daily.   Yes Historical Provider, MD  Vitamin D, Ergocalciferol, (DRISDOL) 50000 units CAPS capsule Take 50,000 Units by mouth every Friday.   Yes Historical Provider, MD    VITAMIN E PO Take 400 Units by mouth daily.    Yes Historical Provider, MD  sucralfate (CARAFATE) 1 GM/10ML suspension Take 10 mLs (1 g total) by mouth 4 (four) times daily -  with meals and at bedtime. Patient not taking: Reported on 03/27/2016 01/03/16   Modena Jansky, MD  tamoxifen (NOLVADEX) 20 MG tablet Take 1 tablet (20 mg total) by mouth daily. Patient not taking: Reported on 03/27/2016 08/28/15   Gordy Levan, MD    Physical Exam: BP 123/83   Pulse 109   Temp 100 F (37.8 C) (Oral)   Resp 16   Ht 5\' 5"  (1.651 m)   Wt 65.8 kg (145 lb 1.6 oz)   SpO2 100%   BMI 24.15 kg/m   General:  NAD Eyes: PERRL ENT: + angular stomatitis,  Neck: supple, no JVD Cardiovascular: sinus tachycardia Respiratory: CTABL Abdomen: soft/ND/ND, positive bowel sounds Skin: venous stasis type of skin pigmentation, with superficial skin breakdown on lateral left lower leg Musculoskeletal:  Pitting edema bilateral lower extremity up to knee, left >right Psychiatric: flat affect Neurologic: no focal findings            Labs on Admission:  Basic Metabolic Panel:  Recent Labs Lab 03/27/16 1546  NA 133*  K 3.8  CL 102  CO2 23  GLUCOSE 152*  BUN 7  CREATININE 0.80  CALCIUM 7.4*   Liver Function Tests:  Recent Labs Lab 03/27/16 1546  AST 50*  ALT 42  ALKPHOS 120  BILITOT 0.5  PROT 5.2*  ALBUMIN 1.9*   No results for input(s): LIPASE, AMYLASE in the last 168 hours. No results for input(s): AMMONIA in the last 168 hours. CBC:  Recent Labs Lab 03/27/16 1546  WBC 10.1  NEUTROABS 7.9*  HGB 8.2*  HCT 24.9*  MCV 72.8*  PLT 440*   Cardiac Enzymes: No results for input(s): CKTOTAL, CKMB, CKMBINDEX, TROPONINI in the last 168 hours.  BNP (last 3 results) No results for input(s): BNP in the last 8760 hours.  ProBNP (last 3 results) No results for input(s): PROBNP in the last 8760 hours.  CBG: No results for input(s): GLUCAP in the last 168 hours.  Radiological Exams  on Admission: No results found.  EKG: Independently reviewed. Sinus tachcardia  Assessment/Plan Present on Admission: **None**   Bilateral lower extremity edema:  Unclear etiology, will get venous US and echocardiogram.cxr no cardiomegaly. Nutrition deficiency also in differential. Lower extremity skin pigmentation possible due to venous stasis changes due to chronic lower extremity edema in the last few months.  Patient does has h/o breast cancer, has been on tamoxifen on and off. With sinus tachycardia concerning for PE , will order CTA chest. If r/o clot, and vital continue to be stable, will consider diuretics, for now elevate legs.  Sepsis?: does has sinus tachycardia, low grade temp of 100, significant lactic acidosis, only possible source is leg cellulitis.  Blood culture pending, due to significant lactic acid and sinus tachycardia, will continue broad spectrum abx for  now, will give gentle hydration.  mild elevation of lft: with h/o fatty liver, will check hepatitis panel and HIV  Anemia: seems at baseline, with h/o gastric bypass, likely iron and vitamin deficiency, will check levels. 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

## 2016-03-27 NOTE — ED Notes (Signed)
Pt moved to hospital bed for comfort.

## 2016-03-27 NOTE — ED Triage Notes (Signed)
Patient here with significant bilateral leg swelling x 2 weeks, complains of tightness to same. This past week has developed redness and itching to same with pain upon ambulation, denies any known trauma

## 2016-03-27 NOTE — Progress Notes (Addendum)
Pharmacy Antibiotic Note  Cheryl Holloway is a 40 y.o. female admitted on 03/27/2016 with cellulitis.  Pharmacy has been consulted for vanc/zosyn dosing. Tmax 100, wbc 10.1, CrCl~85.  Plan: Vanc 1250mg  IV x1; then 1g IV q12h. Goal trough 10-15 Zosyn 3.375g IV (49min inf) x1; then 3.375g IV q8h (4h inf) Monitor clinical progress, c/s, renal function, abx plan/LOT VT@SS  as indicated   Height: 5\' 5"  (165.1 cm) Weight: 145 lb 1.6 oz (65.8 kg) IBW/kg (Calculated) : 57  Temp (24hrs), Avg:100 F (37.8 C), Min:100 F (37.8 C), Max:100 F (37.8 C)   Recent Labs Lab 03/27/16 1546 03/27/16 1604  WBC 10.1  --   CREATININE 0.80  --   LATICACIDVEN  --  2.54*    Estimated Creatinine Clearance: 85 mL/min (by C-G formula based on SCr of 0.8 mg/dL).    No Known Allergies  Antimicrobials this admission: 8/12 vanc >>  8/12 zosyn >>   Dose adjustments this admission:   Microbiology results:    Elicia Lamp, PharmD, [facility] Clinical Pharmacist Pager [phone] 03/27/2016 5:01 PM

## 2016-03-27 NOTE — ED Provider Notes (Addendum)
[facility] DEPT Provider Note   CSN: [id number] Arrival date & time: 03/27/16  1531  First Provider Contact:  None       History   Chief Complaint Chief Complaint  Patient presents with  . Leg Swelling    HPI Cheryl Holloway is a 40 y.o. female.  Level V caveat for urgent need for intervention. Bilateral leg swelling and redness, left greater than right, for 1 week, getting worse. No fever, sweats, chills. This has never happened before. Past medical history includes breast cancer, upper GI bleed, gastric bypass surgery. No trauma or inciting events.      Past Medical History:  Diagnosis Date  . Anemia    takes iron  . Breast cancer ([place])   . Fibroids    uterine    Patient Active Problem List   Diagnosis Date Noted  . Sepsis ([place]) 03/27/2016  . Acute GI bleeding 01/01/2016  . Fatty liver 01/01/2016  . UGIB (upper gastrointestinal bleed) 01/01/2016  . Acute blood loss anemia 09/30/2015  . Malignant neoplasm of right female breast ([place]) 09/30/2015  . Need for prophylactic vaccination and inoculation against influenza 09/30/2015  . Unintentional weight loss 09/30/2015  . S/P mastectomy 04/25/2014  . Breast cancer, right ([place]) 01/27/2014  . Gastric bypass status for obesity 01/27/2014  . Iron deficiency anemia due to chronic blood loss 01/27/2014    Past Surgical History:  Procedure Laterality Date  . BREAST IMPLANT REMOVAL Right 04/25/2014   Procedure: REMOVAL BREAST IMPLANT WITH CAPSULECTOMY;  Surgeon: Theodoro Kos, DO;  Location: [place];  Service: Plastics;  Laterality: Right;  . BREAST RECONSTRUCTION    . BREAST REDUCTION SURGERY    . CESAREAN SECTION     x3  . ESOPHAGOGASTRODUODENOSCOPY N/A 01/02/2016   Procedure: ESOPHAGOGASTRODUODENOSCOPY (EGD);  Surgeon: Clarene Essex, MD;  Location: [facility] ENDOSCOPY;  Service: Endoscopy;  Laterality: N/A;  . GASTRIC BYPASS    . MASTECTOMY     right  . SIMPLE MASTECTOMY WITH AXILLARY SENTINEL NODE  BIOPSY Left 04/25/2014   Procedure: LEFT TOTAL  MASTECTOMY (PROPHYLACTIC);  Surgeon: Rolm Bookbinder, MD;  Location: [place];  Service: General;  Laterality: Left;  . TUBAL LIGATION      OB History    No data available       Home Medications    Prior to Admission medications   Medication Sig Start Date End Date Taking? Authorizing Provider  diphenhydrAMINE-zinc acetate (BENADRYL) cream Apply 1 application topically 3 (three) times daily as needed for itching.   Yes Historical Provider, MD  ferrous fumarate (HEMOCYTE - 106 MG FE) 325 (106 FE) MG TABS tablet Take 1 tablet by mouth daily.   Yes Historical Provider, MD  fish oil-omega-3 fatty acids 1000 MG capsule Take 1 g by mouth daily.   Yes Historical Provider, MD  Multiple Vitamin (MULTIVITAMIN WITH MINERALS) TABS Take 1 tablet by mouth daily.   Yes Historical Provider, MD  pantoprazole (PROTONIX) 40 MG tablet Take 1 tablet (40 mg total) by mouth daily. 01/03/16  Yes Modena Jansky, MD  vitamin B-12 (CYANOCOBALAMIN) 500 MCG tablet Take 500 mcg by mouth daily.   Yes Historical Provider, MD  Vitamin D, Ergocalciferol, (DRISDOL) 50000 units CAPS capsule Take 50,000 Units by mouth every Friday.   Yes Historical Provider, MD  VITAMIN E PO Take 400 Units by mouth daily.    Yes Historical Provider, MD  sucralfate (CARAFATE) 1 GM/10ML suspension Take 10 mLs (1 g total) by mouth 4 (  four) times daily -  with meals and at bedtime. Patient not taking: Reported on 03/27/2016 01/03/16   Modena Jansky, MD  tamoxifen (NOLVADEX) 20 MG tablet Take 1 tablet (20 mg total) by mouth daily. Patient not taking: Reported on 03/27/2016 08/28/15   Gordy Levan, MD    Family History No family history on file.  Social History Social History  Substance Use Topics  . Smoking status: Never Smoker  . Smokeless tobacco: Never Used  . Alcohol use Yes     Comment: social     Allergies   Review of patient's allergies indicates no known  allergies.   Review of Systems Review of Systems  All other systems reviewed and are negative.    Physical Exam Updated Vital Signs BP 128/91   Pulse 103   Temp 100 F (37.8 C) (Oral)   Resp 14   Ht 5\' 5"  (1.651 m)   Wt 145 lb 1.6 oz (65.8 kg)   SpO2 100%   BMI 24.15 kg/m   Physical Exam  Constitutional: She appears well-developed and well-nourished. No distress.  HENT:  Head: Normocephalic and atraumatic.  Eyes: Conjunctivae are normal.  Neck: Neck supple.  Cardiovascular: Normal rate and regular rhythm.   No murmur heard. Pulmonary/Chest: Effort normal and breath sounds normal. No respiratory distress.  Abdominal: Soft. There is no tenderness.  Musculoskeletal: She exhibits no edema.  Neurological: She is alert.  Skin:  Bilateral lower extremities: Edematous, erythematous from thigh to foot, left greater than right  Psychiatric: She has a normal mood and affect.  Nursing note and vitals reviewed.    ED Treatments / Results  Labs (all labs ordered are listed, but only abnormal results are displayed) Labs Reviewed  COMPREHENSIVE METABOLIC PANEL - Abnormal; Notable for the following:       Result Value   Sodium 133 (*)    Glucose, Bld 152 (*)    Calcium 7.4 (*)    Total Protein 5.2 (*)    Albumin 1.9 (*)    AST 50 (*)    All other components within normal limits  CBC WITH DIFFERENTIAL/PLATELET - Abnormal; Notable for the following:    RBC 3.42 (*)    Hemoglobin 8.2 (*)    HCT 24.9 (*)    MCV 72.8 (*)    MCH 24.0 (*)    RDW 16.9 (*)    Platelets 440 (*)    Neutro Abs 7.9 (*)    All other components within normal limits  URINALYSIS, ROUTINE W REFLEX MICROSCOPIC (NOT AT [facility]) - Abnormal; Notable for the following:    Leukocytes, UA TRACE (*)    All other components within normal limits  URINE MICROSCOPIC-ADD ON - Abnormal; Notable for the following:    Squamous Epithelial / LPF 6-30 (*)    Bacteria, UA FEW (*)    Casts HYALINE CASTS (*)    All other  components within normal limits  I-STAT CG4 LACTIC ACID, ED - Abnormal; Notable for the following:    Lactic Acid, Venous 2.54 (*)    All other components within normal limits  I-STAT CG4 LACTIC ACID, ED - Abnormal; Notable for the following:    Lactic Acid, Venous 4.30 (*)    All other components within normal limits  I-STAT CG4 LACTIC ACID, ED - Abnormal; Notable for the following:    Lactic Acid, Venous 2.11 (*)    All other components within normal limits  CULTURE, BLOOD (ROUTINE X 2)  CULTURE, BLOOD (  ROUTINE X 2)  URINE CULTURE  MRSA PCR SCREENING  SEDIMENTATION RATE  C-REACTIVE PROTEIN  ANTINUCLEAR ANTIBODIES, IFA  HIV ANTIBODY (ROUTINE TESTING)  HEPATITIS PANEL, ACUTE  URINE RAPID DRUG SCREEN, HOSP PERFORMED  CBC  COMPREHENSIVE METABOLIC PANEL  TSH  VITAMIN B12  FOLATE  IRON AND TIBC  D-DIMER, QUANTITATIVE (NOT AT [facility])  PROTIME-INR  I-STAT BETA HCG BLOOD, ED ([facility], [facility], [facility] ONLY)  I-STAT CG4 LACTIC ACID, ED    EKG  EKG Interpretation  Date/Time:  Saturday March 27 2016 17:12:25 EDT Ventricular Rate:  105 PR Interval:    QRS Duration: 84 QT Interval:  326 QTC Calculation: 431 R Axis:   44 Text Interpretation:  Sinus tachycardia Baseline wander in lead(s) V3 Confirmed by Milderd Manocchio  MD, Kattie Santoyo ([id number]) on 03/27/2016 6:45:37 PM       Radiology Dg Chest Port 1 View  Result Date: 03/27/2016 CLINICAL DATA:  BILATERAL leg swelling and redness LEFT greater than RIGHT for 1 week, tachycardia, history breast cancer EXAM: PORTABLE CHEST 1 VIEW COMPARISON:  Portable exam 1753 hours compared to CT chest 01/15/2014 FINDINGS: Normal heart size, mediastinal contours, and pulmonary vascularity. Lungs clear. No pleural effusion or pneumothorax. Bones unremarkable. IMPRESSION: No acute abnormalities. Electronically Signed   By: Lavonia Dana M.D.   On: 03/27/2016 18:00    Procedures Procedures (including critical care time)  Medications Ordered in ED Medications  vancomycin  (VANCOCIN) 1,250 mg in sodium chloride 0.9 % 250 mL IVPB (1,250 mg Intravenous New Bag/Given 03/27/16 1749)  vancomycin (VANCOCIN) IVPB 1000 mg/200 mL premix (not administered)  piperacillin-tazobactam (ZOSYN) IVPB 3.375 g (not administered)  hydrocortisone cream 1 % (not administered)  0.9 %  sodium chloride infusion (not administered)  sodium chloride 0.9 % bolus 1,000 mL (0 mLs Intravenous Stopped 03/27/16 1822)    And  sodium chloride 0.9 % bolus 1,000 mL (0 mLs Intravenous Stopped 03/27/16 1822)  piperacillin-tazobactam (ZOSYN) IVPB 3.375 g (0 g Intravenous Stopped 03/27/16 1755)     Initial Impression / Assessment and Plan / ED Course  I have reviewed the triage vital signs and the nursing notes.  Pertinent labs & imaging results that were available during my care of the patient were reviewed by me and considered in my medical decision making (see chart for details). CRITICAL CARE Performed by: Nat Christen  ?  Total critical care time: 30 minutes  Critical care time was exclusive of separately billable procedures and treating other patients.  Critical care was necessary to treat or prevent imminent or life-threatening deterioration.  Critical care was time spent personally by me on the following activities: development of treatment plan with patient and/or surrogate as well as nursing, discussions with consultants, evaluation of patient's response to treatment, examination of patient, obtaining history from patient or surrogate, ordering and performing treatments and interventions, ordering and review of laboratory studies, ordering and review of radiographic studies, pulse oximetry and re-evaluation of patient's condition. Clinical Course    Patient has bilateral lower extremity edema and erythema. Code sepsis initiated. IV fluid bolus, IV Zosyn, IV vancomycin. Admit to general medicine. Patient is hemodynamically stable  Final Clinical Impressions(s) / ED Diagnoses   Final  diagnoses:  Sepsis, due to unspecified organism [facility])  Cellulitis of both lower extremities  Pulmonary emboli [facility])    New Prescriptions New Prescriptions   No medications on file     Nat Christen, MD 03/27/16 [place], MD 03/27/16 1918

## 2016-03-28 ENCOUNTER — Inpatient Hospital Stay (HOSPITAL_COMMUNITY): Payer: Self-pay

## 2016-03-28 ENCOUNTER — Other Ambulatory Visit: Payer: Self-pay | Admitting: Oncology

## 2016-03-28 DIAGNOSIS — R634 Abnormal weight loss: Secondary | ICD-10-CM

## 2016-03-28 DIAGNOSIS — A419 Sepsis, unspecified organism: Secondary | ICD-10-CM

## 2016-03-28 DIAGNOSIS — R600 Localized edema: Secondary | ICD-10-CM

## 2016-03-28 DIAGNOSIS — I509 Heart failure, unspecified: Secondary | ICD-10-CM

## 2016-03-28 DIAGNOSIS — D649 Anemia, unspecified: Secondary | ICD-10-CM

## 2016-03-28 LAB — ECHOCARDIOGRAM COMPLETE
E/e' ratio: 4.59
EWDT: 264 ms
FS: 32 % (ref 28–44)
Height: 65 in
IVS/LV PW RATIO, ED: 0.8
LA diam end sys: 26 mm
LA diam index: 1.49 cm/m2
LA vol A4C: 23.1 ml
LA vol: 33.3 mL
LASIZE: 26 mm
LAVOLIN: 19 mL/m2
LV E/e'average: 4.59
LV TDI E'LATERAL: 14.5
LV e' LATERAL: 14.5 cm/s
LVEEMED: 4.59
LVOT area: 3.14 cm2
LVOT diameter: 20 mm
Lateral S' vel: 12.1 cm/s
MV Dec: 264
MVPKAVEL: 55.3 m/s
MVPKEVEL: 66.5 m/s
PW: 9.63 mm — AB (ref 0.6–1.1)
TAPSE: 18.1 mm
TDI e' medial: 8.92
Weight: 2391.55 oz

## 2016-03-28 LAB — HIV ANTIBODY (ROUTINE TESTING W REFLEX): HIV Screen 4th Generation wRfx: NONREACTIVE

## 2016-03-28 LAB — PROTIME-INR
INR: 1.3
Prothrombin Time: 16.3 seconds — ABNORMAL HIGH (ref 11.4–15.2)

## 2016-03-28 LAB — CBC
HCT: 22.5 % — ABNORMAL LOW (ref 36.0–46.0)
Hemoglobin: 7.5 g/dL — ABNORMAL LOW (ref 12.0–15.0)
MCH: 24.1 pg — ABNORMAL LOW (ref 26.0–34.0)
MCHC: 33.3 g/dL (ref 30.0–36.0)
MCV: 72.3 fL — ABNORMAL LOW (ref 78.0–100.0)
PLATELETS: 379 10*3/uL (ref 150–400)
RBC: 3.11 MIL/uL — ABNORMAL LOW (ref 3.87–5.11)
RDW: 17 % — AB (ref 11.5–15.5)
WBC: 9.1 10*3/uL (ref 4.0–10.5)

## 2016-03-28 LAB — COMPREHENSIVE METABOLIC PANEL
ALBUMIN: 1.5 g/dL — AB (ref 3.5–5.0)
ALT: 37 U/L (ref 14–54)
AST: 50 U/L — AB (ref 15–41)
Alkaline Phosphatase: 108 U/L (ref 38–126)
Anion gap: 8 (ref 5–15)
BUN: 6 mg/dL (ref 6–20)
CHLORIDE: 109 mmol/L (ref 101–111)
CO2: 21 mmol/L — ABNORMAL LOW (ref 22–32)
Calcium: 6.9 mg/dL — ABNORMAL LOW (ref 8.9–10.3)
Creatinine, Ser: 0.8 mg/dL (ref 0.44–1.00)
GFR calc Af Amer: >60 mL/min (ref >60)
Glucose, Bld: 154 mg/dL — ABNORMAL HIGH (ref 65–99)
POTASSIUM: 3.8 mmol/L (ref 3.5–5.1)
Sodium: 138 mmol/L (ref 135–145)
Total Bilirubin: 0.4 mg/dL (ref 0.3–1.2)
Total Protein: 4.3 g/dL — ABNORMAL LOW (ref 6.5–8.1)

## 2016-03-28 LAB — HEPATITIS PANEL, ACUTE
HCV Ab: <0.1 s/co ratio (ref 0.0–0.9)
HEP A IGM: NEGATIVE
Hep B C IgM: NEGATIVE
Hepatitis B Surface Ag: NEGATIVE

## 2016-03-28 LAB — TSH: TSH: 2.788 u[IU]/mL (ref 0.350–4.500)

## 2016-03-28 LAB — URINE CULTURE

## 2016-03-28 LAB — VITAMIN B12: Vitamin B-12: 919 pg/mL — ABNORMAL HIGH (ref 180–914)

## 2016-03-28 LAB — IRON AND TIBC: Iron: 83 ug/dL (ref 28–170)

## 2016-03-28 LAB — LACTIC ACID, PLASMA: Lactic Acid, Venous: 2 mmol/L (ref 0.5–1.9)

## 2016-03-28 LAB — FOLATE: FOLATE: 39 ng/mL (ref >5.9)

## 2016-03-28 LAB — MRSA PCR SCREENING: MRSA BY PCR: NEGATIVE

## 2016-03-28 MED ORDER — DIPHENHYDRAMINE-ZINC ACETATE 1-0.1 % EX CREA: 1.0000 "application " | TOPICAL_CREAM | Freq: Three times a day (TID) | CUTANEOUS | Status: DC | PRN

## 2016-03-28 MED ORDER — CYANOCOBALAMIN 500 MCG PO TABS
500.0000 ug | ORAL_TABLET | Freq: Every day | ORAL | Status: DC
  Administered 2016-03-28 – 2016-03-30 (×3): 500 ug via ORAL
  Filled 2016-03-28 (×3): qty 1

## 2016-03-28 MED ORDER — FUROSEMIDE 10 MG/ML IJ SOLN
40.0000 mg | Freq: Two times a day (BID) | INTRAMUSCULAR | Status: DC
Start: 2016-03-28 — End: 2016-03-29
  Administered 2016-03-28 – 2016-03-29 (×3): 40 mg via INTRAVENOUS
  Filled 2016-03-28 (×3): qty 4

## 2016-03-28 MED ORDER — DOXYCYCLINE HYCLATE 100 MG PO TABS
100.0000 mg | ORAL_TABLET | Freq: Two times a day (BID) | ORAL | Status: DC
  Administered 2016-03-28 – 2016-03-30 (×5): 100 mg via ORAL
  Filled 2016-03-28 (×5): qty 1

## 2016-03-28 MED ORDER — ENOXAPARIN SODIUM 40 MG/0.4ML ~~LOC~~ SOLN
40.0000 mg | SUBCUTANEOUS | Status: DC
  Administered 2016-03-28 – 2016-03-30 (×3): 40 mg via SUBCUTANEOUS
  Filled 2016-03-28 (×3): qty 0.4

## 2016-03-28 MED ORDER — SODIUM CHLORIDE 0.9% FLUSH
3.0000 mL | Freq: Two times a day (BID) | INTRAVENOUS | Status: DC
  Administered 2016-03-28 – 2016-03-30 (×5): 3 mL via INTRAVENOUS

## 2016-03-28 MED ORDER — ENSURE ENLIVE PO LIQD
237.0000 mL | Freq: Two times a day (BID) | ORAL | Status: DC
  Administered 2016-03-28 – 2016-03-30 (×4): 237 mL via ORAL

## 2016-03-28 MED ORDER — DIPHENHYDRAMINE-ZINC ACETATE 2-0.1 % EX CREA: TOPICAL_CREAM | Freq: Three times a day (TID) | CUTANEOUS | Status: DC | PRN

## 2016-03-28 MED ORDER — ADULT MULTIVITAMIN W/MINERALS CH
1.0000 | ORAL_TABLET | Freq: Every day | ORAL | Status: DC
  Administered 2016-03-28 – 2016-03-30 (×3): 1 via ORAL
  Filled 2016-03-28 (×3): qty 1

## 2016-03-28 MED ORDER — PANTOPRAZOLE SODIUM 40 MG PO TBEC
40.0000 mg | DELAYED_RELEASE_TABLET | Freq: Every day | ORAL | Status: DC
  Administered 2016-03-28 – 2016-03-30 (×3): 40 mg via ORAL
  Filled 2016-03-28 (×3): qty 1

## 2016-03-28 MED ORDER — VITAMIN D (ERGOCALCIFEROL) 1.25 MG (50000 UNIT) PO CAPS: 50000.0000 [IU] | ORAL_CAPSULE | ORAL | Status: DC

## 2016-03-28 MED ORDER — FERROUS FUMARATE 324 (106 FE) MG PO TABS
1.0000 | ORAL_TABLET | Freq: Every day | ORAL | Status: DC
  Administered 2016-03-28 – 2016-03-30 (×3): 106 mg via ORAL
  Filled 2016-03-28 (×3): qty 1

## 2016-03-28 MED ORDER — SODIUM CHLORIDE 0.9 % IV SOLN
INTRAVENOUS | Status: AC
  Administered 2016-03-28: 07:00:00 via INTRAVENOUS

## 2016-03-28 NOTE — Progress Notes (Signed)
*  Preliminary Results* Bilateral lower extremity venous duplex completed. Study was technically limited and difficult due to edema. Visualized veins of bilateral lower extremities are negative for deep vein thrombosis. There is no evidence of Baker's cyst bilaterally.  03/28/2016 2:47 PM Maudry Mayhew, BS, RVT, RDCS, RDMS

## 2016-03-28 NOTE — Progress Notes (Signed)
  Echocardiogram 2D Echocardiogram has been performed.  Johny Chess 03/28/2016, 1:01 PM

## 2016-03-28 NOTE — Progress Notes (Signed)
PROGRESS NOTE  Cheryl Holloway [id number] DOB: 11-Apr-1976 DOA: 03/27/2016 PCP: Wenda Low, MD  HPI/Recap of past 24 hours:  Fever resolved, heart rate normalized, Feeling better, legs are less itchy, still swollen  Assessment/Plan: Active Problems:   Sepsis ([place])   Bilateral lower extremity edema:  Unclear etiology,  venous US and echocardiogram pending.cxr no cardiomegaly. Nutrition deficiency also in differential. Lower extremity skin pigmentation possible due to venous stasis changes due to chronic lower extremity edema in the last few months.  Patient does has h/o breast cancer, has been on tamoxifen on and off. With sinus tachycardia concerning for PE , CTA chest ordered which is negative for PE D/c ivf, Start lasix, elevate legs, nutrition supplement.  Sepsis?:  does has sinus tachycardia, low grade temp of 100, significant lactic acidosis, only possible source is leg cellulitis.  Blood culture pending, due to significant lactic acid and sinus tachycardia, she received  Hydration and broad spectrum abx with vanc/zosyn initially, better, lactic acid trending down. No leukocytosis, fever resolved. 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, has been on and off tamoxifen    DVT prophylaxis: lovenox  Consultants: none  Code Status: full   Family Communication:  Patient  Disposition Plan: out of stepdown to med tele    Consultants:  none  Procedures:  none  Antibiotics:  Vanc/zosyn from 8/12 to 8/13  Doxycycline from 8/13   Objective: BP 122/80   Pulse 80   Temp 98.7 F (37.1 C) (Oral)   Resp 10   Ht 5\' 5"  (1.651 m)    Wt 67.8 kg (149 lb 7.6 oz)   SpO2 100%   BMI 24.87 kg/m   Intake/Output Summary (Last 24 hours) at 03/28/16 [id number] Last data filed at 03/28/16 0700  Gross per 24 hour  Intake          3251.25 ml  Output                0 ml  Net          3251.25 ml   Filed Weights   03/27/16 1540 03/28/16 0306  Weight: 65.8 kg (145 lb 1.6 oz) 67.8 kg (149 lb 7.6 oz)    Exam:   General:  NAD  Cardiovascular: RRR  Respiratory: CTABL, s/p bilateral mastectomy   Abdomen: Soft/ND/NT, positive BS  Musculoskeletal: pitting  Edema bilateral lower extremity, skin pigmentation worse on left side, erythema and warmness is better  Neuro: aaox3  Data Reviewed: Basic Metabolic Panel:  Recent Labs Lab 03/27/16 1546 03/28/16 0128  NA 133* 138  K 3.8 3.8  CL 102 109  CO2 23 21*  GLUCOSE 152* 154*  BUN 7 6  CREATININE 0.80 0.80  CALCIUM 7.4* 6.9*   Liver Function Tests:  Recent Labs Lab 03/27/16 1546 03/28/16 0128  AST 50* 50*  ALT 42 37  ALKPHOS 120 108  BILITOT 0.5 0.4  PROT 5.2* 4.3*  ALBUMIN 1.9* 1.5*   No results for input(s): LIPASE, AMYLASE in the last 168 hours. No results for input(s): AMMONIA in the last 168 hours. CBC:  Recent Labs Lab 03/27/16 1546 03/28/16 0128  WBC 10.1 9.1  NEUTROABS 7.9*  --   HGB 8.2* 7.5*  HCT 24.9* 22.5*  MCV 72.8* 72.3*  PLT 440* 379   Cardiac Enzymes:   No results for input(s): CKTOTAL, CKMB, CKMBINDEX, TROPONINI in the last 168 hours. BNP (last 3 results) No results for input(s): BNP in the last 8760 hours.  ProBNP (last 3 results) No results for input(s): PROBNP in the last 8760 hours.  CBG: No results for input(s): GLUCAP in the last 168 hours.  Recent Results (from the past 240 hour(s))  MRSA PCR Screening     Status: None   Collection Time: 03/28/16  3:11 AM  Result Value Ref Range Status   MRSA by PCR NEGATIVE NEGATIVE Final    Comment:        The GeneXpert MRSA Assay (FDA approved for NASAL specimens only), is  one component of a comprehensive MRSA colonization surveillance program. It is not intended to diagnose MRSA infection nor to guide or monitor treatment for MRSA infections.      Studies: Ct Angio Chest Pe W Or Wo Contrast  Result Date: 03/27/2016 CLINICAL DATA:  Lower extremity swelling and pain for 2 weeks. Pulmonary emboli. Right breast carcinoma. EXAM: CT ANGIOGRAPHY CHEST WITH CONTRAST TECHNIQUE: Multidetector CT imaging of the chest was performed using the standard protocol during bolus administration of intravenous contrast. Multiplanar CT image reconstructions and MIPs were obtained to evaluate the vascular anatomy. CONTRAST:  100 mL Isovue 370 COMPARISON:  01/15/2014 FINDINGS: Cardiovascular: Satisfactory opacification of pulmonary arteries is seen, there is no evidence of pulmonary embolism. No evidence of thoracic aortic aneurysm or dissection. Normal heart size. Mediastinum/Lymph Nodes: No masses or pathologically enlarged lymph nodes identified. Lungs/Pleura: No pulmonary mass, infiltrate, or effusion. Upper abdomen: No acute findings. Postop changes from gastric bypass surgery and hepatic steatosis again noted. Musculoskeletal: No chest wall mass or suspicious bone lesions identified. Review of the MIP images confirms the above findings. IMPRESSION: No evidence of pulmonary embolism or other acute findings. Stable hepatic steatosis and postop changes from gastric bypass surgery. Electronically Signed   By: Earle Gell M.D.   On: 03/27/2016 20:49   Dg Chest Port 1 View  Result Date: 03/27/2016 CLINICAL DATA:  BILATERAL leg swelling and redness LEFT greater than RIGHT for 1 week, tachycardia, history breast cancer EXAM: PORTABLE CHEST 1 VIEW COMPARISON:  Portable exam 1753 hours compared to CT chest 01/15/2014 FINDINGS: Normal heart size, mediastinal contours, and pulmonary vascularity. Lungs clear. No pleural effusion or pneumothorax. Bones unremarkable. IMPRESSION: No acute  abnormalities. Electronically Signed   By: Lavonia Dana M.D.   On: 03/27/2016 18:00    Scheduled Meds: . sodium chloride   Intravenous STAT  . cyanocobalamin  500 mcg Oral Daily  . doxycycline  100 mg Oral Q12H  . enoxaparin (LOVENOX) injection  40 mg Subcutaneous Q24H  . feeding supplement (ENSURE ENLIVE)  237 mL Oral BID BM  . Ferrous Fumarate  1 tablet Oral Daily  . furosemide  40 mg Intravenous BID  . hydrocortisone cream   Topical BID  . multivitamin with minerals  1 tablet Oral Daily  . pantoprazole  40 mg Oral Daily  . sodium chloride flush  3 mL Intravenous Q12H  . [START ON 04/02/2016] Vitamin D (Ergocalciferol)  50,000 Units Oral Q Fri    Continuous Infusions:     Time spent: 25 mins  Addilynn Mowrer MD, PhD  [facility] Pager [phone]. If 7PM-7AM, please contact night-coverage at [email or web address], password [facility] 03/28/2016, 9:22 AM  LOS: 1 day

## 2016-03-29 ENCOUNTER — Other Ambulatory Visit: Payer: BLUE CROSS/BLUE SHIELD

## 2016-03-29 ENCOUNTER — Ambulatory Visit: Payer: BLUE CROSS/BLUE SHIELD | Admitting: Oncology

## 2016-03-29 DIAGNOSIS — E46 Unspecified protein-calorie malnutrition: Secondary | ICD-10-CM

## 2016-03-29 DIAGNOSIS — E876 Hypokalemia: Secondary | ICD-10-CM

## 2016-03-29 LAB — CBC
HEMATOCRIT: 23.3 % — AB (ref 36.0–46.0)
HEMOGLOBIN: 7.7 g/dL — AB (ref 12.0–15.0)
MCH: 23.5 pg — ABNORMAL LOW (ref 26.0–34.0)
MCHC: 33 g/dL (ref 30.0–36.0)
MCV: 71.3 fL — AB (ref 78.0–100.0)
Platelets: 390 10*3/uL (ref 150–400)
RBC: 3.27 MIL/uL — ABNORMAL LOW (ref 3.87–5.11)
RDW: 17.1 % — ABNORMAL HIGH (ref 11.5–15.5)
WBC: 6.5 10*3/uL (ref 4.0–10.5)

## 2016-03-29 LAB — BASIC METABOLIC PANEL
ANION GAP: 5 (ref 5–15)
BUN: <5 mg/dL — ABNORMAL LOW (ref 6–20)
CHLORIDE: 108 mmol/L (ref 101–111)
CO2: 28 mmol/L (ref 22–32)
CREATININE: 0.71 mg/dL (ref 0.44–1.00)
Calcium: 7.4 mg/dL — ABNORMAL LOW (ref 8.9–10.3)
GFR calc non Af Amer: >60 mL/min (ref >60)
GLUCOSE: 82 mg/dL (ref 65–99)
Potassium: 3.3 mmol/L — ABNORMAL LOW (ref 3.5–5.1)
Sodium: 141 mmol/L (ref 135–145)

## 2016-03-29 LAB — ANTINUCLEAR ANTIBODIES, IFA: ANA Ab, IFA: NEGATIVE

## 2016-03-29 LAB — PROCALCITONIN: PROCALCITONIN: 0.26 ng/mL

## 2016-03-29 LAB — MAGNESIUM: Magnesium: 1.5 mg/dL — ABNORMAL LOW (ref 1.7–2.4)

## 2016-03-29 LAB — LACTIC ACID, PLASMA: LACTIC ACID, VENOUS: 2.2 mmol/L — AB (ref 0.5–1.9)

## 2016-03-29 MED ORDER — MAGNESIUM SULFATE 2 GM/50ML IV SOLN
2.0000 g | Freq: Once | INTRAVENOUS | Status: AC
  Administered 2016-03-29: 2 g via INTRAVENOUS
  Filled 2016-03-29: qty 50

## 2016-03-29 MED ORDER — FUROSEMIDE 10 MG/ML IJ SOLN
40.0000 mg | Freq: Every day | INTRAMUSCULAR | Status: DC
  Administered 2016-03-30: 40 mg via INTRAVENOUS
  Filled 2016-03-29: qty 4

## 2016-03-29 MED ORDER — POTASSIUM CHLORIDE CRYS ER 20 MEQ PO TBCR
40.0000 meq | EXTENDED_RELEASE_TABLET | Freq: Once | ORAL | Status: AC
  Administered 2016-03-29: 40 meq via ORAL
  Filled 2016-03-29: qty 2

## 2016-03-29 MED ORDER — ALBUMIN HUMAN 25 % IV SOLN
12.5000 g | Freq: Once | INTRAVENOUS | Status: AC
  Administered 2016-03-30: 12.5 g via INTRAVENOUS
  Filled 2016-03-29: qty 50

## 2016-03-29 MED ORDER — ALBUMIN HUMAN 25 % IV SOLN
12.5000 g | Freq: Once | INTRAVENOUS | Status: AC
  Administered 2016-03-29: 12.5 g via INTRAVENOUS
  Filled 2016-03-29: qty 50

## 2016-03-29 NOTE — Progress Notes (Signed)
Patient lying in bed, no needs at this time, call light within reach 

## 2016-03-29 NOTE — Progress Notes (Signed)
PROGRESS NOTE  Cheryl Holloway [id number] DOB: [birth year] DOA: 03/27/2016 PCP: Wenda Low, MD  HPI/Recap of past 24 hours:  Fever resolved, Feeling better, legs are less itchy, less swollen She report tachycardia standing up  Assessment/Plan: Active Problems:   Sepsis ([place])   Bilateral lower extremity edema:  Unclear etiology,  venous US no DVT and echocardiogram unremarkable.cxr no cardiomegaly. Lower extremity edema possibly from Nutrition deficiency , albumin 1.5 Lower extremity skin pigmentation possible due to venous stasis changes due to chronic lower extremity edema in the last few months.  Patient does has h/o breast cancer, has been on tamoxifen on and off. With sinus tachycardia concerning for PE , CTA chest ordered which is negative for PE, lower extremity ultrasound, no DVT. D/c ivf, Start lasix, elevate legs, nutrition supplement. Patient feeling dizzy standing up and heart rate went up upon standing, suspect patient is intravascularly dry, will decrease lasix dose, give albumin to expend intravascular volume.  Sepsis? From superficial cellulitis  does has sinus tachycardia, low grade temp of 100, significant lactic acidosis, only possible source is leg cellulitis.  Blood culture no growth, due to significant lactic acid and sinus tachycardia, she received  Hydration and broad spectrum abx with vanc/zosyn initially, better, lactic acid trending down. No leukocytosis, fever resolved. 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, has been on  and off tamoxifen    DVT prophylaxis: lovenox  Consultants: none  Code Status: full   Family Communication:  Patient  Disposition Plan: anticipate d/c on 8/15    Consultants:  none  Procedures:  none  Antibiotics:  Vanc/zosyn from 8/12 to 8/13  Doxycycline from 8/13   Objective: BP 106/76 (BP Location: Left Arm)   Pulse (!) 113   Temp 98.4 F (36.9 C) (Oral)   Resp 16   Ht 5\' 5"  (1.651 m)   Wt 67.8 kg (149 lb 7.6 oz)   SpO2 100%   BMI 24.87 kg/m   Intake/Output Summary (Last 24 hours) at 03/29/16 1154 Last data filed at 03/29/16 0700  Gross per 24 hour  Intake             1020 ml  Output                0 ml  Net             1020 ml   Filed Weights   03/27/16 1540 03/28/16 0306  Weight: 65.8 kg (145 lb 1.6 oz) 67.8 kg (149 lb 7.6 oz)    Exam:   General:  NAD  Cardiovascular: RRR  Respiratory: CTABL, s/p bilateral mastectomy   Abdomen: Soft/ND/NT, positive BS  Musculoskeletal: less pitting  Edema bilateral lower extremity, skin pigmentation worse on left side, erythema and warmness has resolved  Neuro: aaox3  Data Reviewed: Basic Metabolic Panel:  Recent Labs Lab 03/27/16 1546 03/28/16 0128 03/29/16 0302  NA 133* 138 141  K 3.8 3.8 3.3*  CL 102 109 108  CO2 23 21* 28  GLUCOSE 152* 154* 82  BUN 7 6 <5*  CREATININE 0.80 0.80 0.71  CALCIUM 7.4* 6.9* 7.4*  MG  --   --  1.5*   Liver Function Tests:  Recent Labs Lab 03/27/16 1546 03/28/16 0128  AST 50* 50*  ALT 42 37  ALKPHOS 120 108  BILITOT 0.5 0.4  PROT 5.2* 4.3*  ALBUMIN 1.9* 1.5*   No results for input(s): LIPASE, AMYLASE in the last 168 hours. No results for input(s): AMMONIA in the last 168 hours. CBC:  Recent Labs Lab 03/27/16 1546 03/28/16 0128 03/29/16 0302  WBC 10.1 9.1 6.5  NEUTROABS 7.9*  --   --   HGB 8.2* 7.5* 7.7*  HCT 24.9* 22.5* 23.3*  MCV 72.8* 72.3* 71.3*  PLT 440* 379 390   Cardiac Enzymes:   No results for input(s): CKTOTAL, CKMB,  CKMBINDEX, TROPONINI in the last 168 hours. BNP (last 3 results) No results for input(s): BNP in the last 8760 hours.  ProBNP (last 3 results) No results for input(s): PROBNP in the last 8760 hours.  CBG: No results for input(s): GLUCAP in the last 168 hours.  Recent Results (from the past 240 hour(s))  Blood Culture (routine x 2)     Status: None (Preliminary result)   Collection Time: 03/27/16  4:30 PM  Result Value Ref Range Status   Specimen Description RIGHT ANTECUBITAL  Final   Special Requests BOTTLES DRAWN AEROBIC AND ANAEROBIC 5ML  Final   Culture NO GROWTH < 24 HOURS  Final   Report Status PENDING  Incomplete  Urine culture     Status: Abnormal   Collection Time: 03/27/16  5:05 PM  Result Value Ref Range Status   Specimen Description URINE, CLEAN CATCH  Final   Special Requests NONE  Final   Culture MULTIPLE SPECIES PRESENT, SUGGEST RECOLLECTION (A)  Final   Report Status 03/28/2016 FINAL  Final  Blood Culture (routine x 2)     Status: None (Preliminary result)   Collection Time: 03/27/16  5:08 PM  Result Value Ref Range Status   Specimen Description BLOOD RIGHT HAND  Final   Special Requests IN PEDIATRIC BOTTLE 1CC  Final   Culture NO GROWTH < 24 HOURS  Final   Report Status PENDING  Incomplete  MRSA PCR Screening     Status: None   Collection Time: 03/28/16  3:11 AM  Result Value Ref Range Status   MRSA by PCR NEGATIVE NEGATIVE Final    Comment:        The GeneXpert MRSA Assay (FDA approved for NASAL specimens only), is one component of a comprehensive MRSA colonization surveillance program. It is not intended to diagnose MRSA infection nor to guide or monitor treatment for MRSA infections.      Studies: No results found.  Scheduled Meds: . cyanocobalamin  500 mcg Oral Daily  . doxycycline  100 mg Oral Q12H  . enoxaparin (LOVENOX) injection  40 mg Subcutaneous Q24H  . feeding supplement (ENSURE ENLIVE)  237 mL Oral BID BM  . Ferrous Fumarate  1  tablet Oral Daily  . furosemide  40 mg Intravenous BID  . hydrocortisone cream   Topical BID  . magnesium sulfate 1 - 4 g bolus IVPB  2 g Intravenous Once  . multivitamin with minerals  1 tablet Oral Daily  . pantoprazole  40 mg Oral Daily  . potassium chloride  40 mEq Oral Once  . sodium chloride flush  3 mL Intravenous Q12H  . [START ON 04/02/2016] Vitamin D (Ergocalciferol)  50,000 Units Oral Q Fri    Continuous Infusions:     Time spent: 25 mins  Alexcis Bicking MD, PhD  [facility] Pager [phone]. If 7PM-7AM, please contact night-coverage at [email or web address], password [facility] 03/29/2016, 11:54 AM  LOS: 2 days

## 2016-03-29 NOTE — Progress Notes (Signed)
CRITICAL VALUE ALERT  Critical value received:  Lactic acid  Date of notification:  03/29/2016  Time of notification:  [id number]  Critical value read back: Yes  Nurse who received alert:  Izola Price, RN  MD notified (1st page):  Yes  Time of first page:  [phone]  Responding MD:  Shawn Route  Time MD responded:  [id number]  No new orders at this time, MD will continue to monitor   Izola Price, RN 03/29/2016

## 2016-03-30 DIAGNOSIS — L0390 Cellulitis, unspecified: Secondary | ICD-10-CM

## 2016-03-30 DIAGNOSIS — R634 Abnormal weight loss: Secondary | ICD-10-CM

## 2016-03-30 LAB — BASIC METABOLIC PANEL
Anion gap: 4 — ABNORMAL LOW (ref 5–15)
CALCIUM: 7.9 mg/dL — AB (ref 8.9–10.3)
CHLORIDE: 109 mmol/L (ref 101–111)
CO2: 27 mmol/L (ref 22–32)
CREATININE: 0.71 mg/dL (ref 0.44–1.00)
GFR calc Af Amer: >60 mL/min (ref >60)
GFR calc non Af Amer: >60 mL/min (ref >60)
Glucose, Bld: 85 mg/dL (ref 65–99)
Potassium: 4.2 mmol/L (ref 3.5–5.1)
SODIUM: 140 mmol/L (ref 135–145)

## 2016-03-30 LAB — LIPID PANEL
CHOL/HDL RATIO: 4.2 ratio
Cholesterol: 42 mg/dL (ref 0–200)
HDL: 10 mg/dL — ABNORMAL LOW (ref >40)
LDL Cholesterol: 24 mg/dL (ref 0–99)
Triglycerides: 42 mg/dL (ref <150)
VLDL: 8 mg/dL (ref 0–40)

## 2016-03-30 LAB — MAGNESIUM: Magnesium: 2 mg/dL (ref 1.7–2.4)

## 2016-03-30 MED ORDER — FUROSEMIDE 40 MG PO TABS: 40.0000 mg | ORAL_TABLET | Freq: Every day | ORAL | 0 refills | Status: DC

## 2016-03-30 MED ORDER — ENSURE ENLIVE PO LIQD: 237.0000 mL | Freq: Two times a day (BID) | ORAL | 12 refills | Status: DC

## 2016-03-30 MED ORDER — DOXYCYCLINE HYCLATE 100 MG PO TABS: 100.0000 mg | ORAL_TABLET | Freq: Two times a day (BID) | ORAL | 0 refills | Status: AC

## 2016-03-30 NOTE — Discharge Summary (Signed)
Discharge Summary  Cheryl Holloway [id number] DOB: 02/20/1976  PCP: Wenda Low, MD  Admit date: 03/27/2016 Discharge date: 03/30/2016  Time spent: <11mins  Recommendations for Outpatient Follow-up:  1. F/u with PMD within a week  for hospital discharge follow up, repeat cbc/bmp at follow up 2. F/u with GI for chronic diarrhea, unintentional weight loss, malnutrition  Discharge Diagnoses:  Active Hospital Problems   Diagnosis Date Noted  . Sepsis ([place]) 03/27/2016    Resolved Hospital Problems   Diagnosis Date Noted Date Resolved  No resolved problems to display.    Discharge Condition: stable  Diet recommendation: regular diet  Filed Weights   03/27/16 1540 03/28/16 0306  Weight: 65.8 kg (145 lb 1.6 oz) 67.8 kg (149 lb 7.6 oz)    History of present illness:  Chief Complaint: legs are swollen and pain  HPI: Cheryl Holloway is a 40 y.o. 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 Marko Plume), gastric bypass done in [place] several years ago, unintentional weight loss and diarrhea suspected from dumping syndrome, fatty liver, chronic microcytic/iron deficiency anemia, h/o GI bleed due to jejunal ulcer required hospitalization in 12/2015, she presented to the ED due to progressive lower extremity edema in the last few months and worsening in the last 2-3 weeks, she also report progressive skin pigmentation of the lower extremity. She report she was given diuretics in the past for the edema.   ED course, she has low grade temp of 100, she has sinus tachycardia in the 110's, blood pressure stable, she is on room air.  She has bilateral lower extremity edema on exam, left worse than right, Wbc 10, hgb 8, platelet 440, ns 133, cr 0.8, albumin 1.9, ast 50, lactic acid elevated 2.5 to 4.3, cxr no acute findings, ua no infection, EDP started sepsis  protocol , she is given 2liters of ns and started on  vanc/zosyn , hospitalist called to admit the patient for cellulitis.   Patient denies chest pain, no sob,no DOE,  she report baseline active, she works in [employer] cabin [employer]. She report she has trouble to gain weight and she has chronic diarrhea.  Hospital Course:  Active Problems:   Sepsis ([place])   Bilateral lower extremity edema: Unclear etiology,  venous US no DVT and echocardiogram unremarkable.cxr no cardiomegaly. Lower extremity edema possibly from Nutrition deficiency , albumin 1.5 Lower extremity skin pigmentation possible due to venous stasis changes due to chronic lower extremity edema in the last few months. Patient does has h/o breast cancer, has been on tamoxifen on and off. With sinus tachycardia concerning for PE , CTA chest ordered which is negative for PE, lower extremity ultrasound, no DVT. 8/13 D/c ivf, Start lasix, elevate legs, nutrition supplement. 8/14 Patient feeling dizzy standing up and heart rate went up upon standing, suspect patient is intravascularly dry, will decrease lasix dose, give albumin to expend intravascular volume. 8/15 She is feeling better, she is tolerating lasix 40mg  daily, she received total of 25g of iv albumin, no fever, lower extremity has much improved, erythema and tenderness has resolved, no fever, no leukocytosis, she is discharged on oral doxycycline for 31more days and lasix 40mg  po daily. She is provided with compression stocking and nutrition supplement.  Sepsis? From superficial cellulitis  does has sinus tachycardia, low grade temp of 100, significant lactic acidosis, only possible source is leg cellulitis with erythema /warmenss/tenderness on presentation, which  has resolved at discharge.  Blood culture no growth, due to significant lactic acid and sinus tachycardia, she received  Hydration and broad spectrum abx with vanc/zosyn initially, better, lactic acid trending down. No  leukocytosis, fever resolved. D/c ivf, culture no growth, d/c iv abx, change to oral doxycycline for possible cellulitis, erythema of lower extremity has resovled She is discharged on oral doxycycline  Hypokalemia/hypomagnesemia: replaced k/mag  mild elevation of lft: with h/o fatty liver, negative hepatitis panel and negative HIV,  lipid panel with low HDL otherwise unremarkable  Chronic Anemia: seems at baseline, with h/o gastric bypass, likely iron and vitamin deficiency,  Continue supplement  H/o bypass surgery for weight loss: chronic diarrhea, dumping syndrome? Malnutrition, albumin 1.5, she is to continue follow with GI and nutritionist. Continue vitamin supplement.  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    DVT prophylaxis: lovenox  Consultants:none  Code Status:full   Family Communication:Patient  Disposition Plan: d/c on 8/15    Consultants:  none  Procedures:  none  Antibiotics:  Vanc/zosyn from 8/12 to 8/13  Doxycycline from 8/13   Discharge Exam: BP 113/71 (BP Location: Left Arm)   Pulse 79   Temp 98.1 F (36.7 C) (Oral)   Resp 16   Ht 5\' 5"  (1.651 m)   Wt 67.8 kg (149 lb 7.6 oz)   SpO2 99%   BMI 24.87 kg/m     General:  NAD  Cardiovascular: RRR  Respiratory: CTABL, s/p bilateral mastectomy   Abdomen: Soft/ND/NT, positive BS  Musculoskeletal: less pitting  Edema bilateral lower extremity, chronic skin pigmentation worse on left side, erythema and warmness has resolved  Neuro: aaox3  Discharge Instructions You were cared for by a hospitalist during your hospital stay. If you have any questions about your discharge medications or the care you received while you were in the hospital after you are discharged, you can call the unit and asked to speak with the hospitalist on call if the hospitalist that took care  of you is not available. Once you are discharged, your primary care physician will handle any further medical issues. Please note that NO REFILLS for any discharge medications will be authorized once you are discharged, as it is imperative that you return to your primary care physician (or establish a relationship with a primary care physician if you do not have one) for your aftercare needs so that they can reassess your need for medications and monitor your lab values.  Discharge Instructions    Compression stockings    Complete by:  As directed   Bilateral lower extremity   Diet general    Complete by:  As directed   Discharge instructions    Complete by:  As directed   Elevate legs when sitting, wear compression stocking while walking   Increase activity slowly    Complete by:  As directed       Medication List    STOP taking these medications   pantoprazole 40 MG tablet Commonly known as:  PROTONIX   sucralfate 1 GM/10ML suspension Commonly known as:  CARAFATE   tamoxifen 20 MG tablet Commonly known as:  NOLVADEX     TAKE these medications   diphenhydrAMINE-zinc acetate cream Commonly known as:  BENADRYL Apply 1 application topically 3 (three) times daily as needed for itching.   doxycycline 100 MG tablet Commonly known as:  VIBRA-TABS Take 1 tablet (100 mg  total) by mouth every 12 (twelve) hours.   feeding supplement (ENSURE ENLIVE) Liqd Take 237 mLs by mouth 2 (two) times daily between meals.   ferrous fumarate 325 (106 Fe) MG Tabs tablet Commonly known as:  HEMOCYTE - 106 mg FE Take 1 tablet by mouth daily.   fish oil-omega-3 fatty acids 1000 MG capsule Take 1 g by mouth daily.   furosemide 40 MG tablet Commonly known as:  LASIX Take 1 tablet (40 mg total) by mouth daily.   multivitamin with minerals Tabs tablet Take 1 tablet by mouth daily.   vitamin B-12 500 MCG tablet Commonly known as:  CYANOCOBALAMIN Take 500 mcg by mouth daily.   Vitamin D  (Ergocalciferol) 50000 units Caps capsule Commonly known as:  DRISDOL Take 50,000 Units by mouth every Friday.   VITAMIN E PO Take 400 Units by mouth daily.      No Known Allergies Follow-up Information    HUSAIN,KARRAR, MD Follow up in 1 week(s).   Specialty:  Internal Medicine Why:  hospital discharge follow up, repeat cbc/bmp, pmd to direct lasix dosage and duration at follow up. Contact information: [address] [place] [address] [phone]        Lear Ng., MD Follow up in 2 week(s).   Specialty:  Gastroenterology Why:  chronic diarrhea, malnutrition please see a nutritionist Contact information: [id number] [place] [address] [phone]            The results of significant diagnostics from this hospitalization (including imaging, microbiology, ancillary and laboratory) are listed below for reference.    Significant Diagnostic Studies: Ct Angio Chest Pe W Or Wo Contrast  Result Date: 03/27/2016 CLINICAL DATA:  Lower extremity swelling and pain for 2 weeks. Pulmonary emboli. Right breast carcinoma. EXAM: CT ANGIOGRAPHY CHEST WITH CONTRAST TECHNIQUE: Multidetector CT imaging of the chest was performed using the standard protocol during bolus administration of intravenous contrast. Multiplanar CT image reconstructions and MIPs were obtained to evaluate the vascular anatomy. CONTRAST:  100 mL Isovue 370 COMPARISON:  01/15/2014 FINDINGS: Cardiovascular: Satisfactory opacification of pulmonary arteries is seen, there is no evidence of pulmonary embolism. No evidence of thoracic aortic aneurysm or dissection. Normal heart size. Mediastinum/Lymph Nodes: No masses or pathologically enlarged lymph nodes identified. Lungs/Pleura: No pulmonary mass, infiltrate, or effusion. Upper abdomen: No acute findings. Postop changes from gastric bypass surgery and hepatic steatosis again noted. Musculoskeletal: No chest wall mass or  suspicious bone lesions identified. Review of the MIP images confirms the above findings. IMPRESSION: No evidence of pulmonary embolism or other acute findings. Stable hepatic steatosis and postop changes from gastric bypass surgery. Electronically Signed   By: Earle Gell M.D.   On: 03/27/2016 20:49   Dg Chest Port 1 View  Result Date: 03/27/2016 CLINICAL DATA:  BILATERAL leg swelling and redness LEFT greater than RIGHT for 1 week, tachycardia, history breast cancer EXAM: PORTABLE CHEST 1 VIEW COMPARISON:  Portable exam 1753 hours compared to CT chest 01/15/2014 FINDINGS: Normal heart size, mediastinal contours, and pulmonary vascularity. Lungs clear. No pleural effusion or pneumothorax. Bones unremarkable. IMPRESSION: No acute abnormalities. Electronically Signed   By: Lavonia Dana M.D.   On: 03/27/2016 18:00    Microbiology: Recent Results (from the past 240 hour(s))  Blood Culture (routine x 2)     Status: None (Preliminary result)   Collection Time: 03/27/16  4:30 PM  Result Value Ref Range Status   Specimen Description RIGHT ANTECUBITAL  Final  Special Requests BOTTLES DRAWN AEROBIC AND ANAEROBIC 5ML  Final   Culture NO GROWTH 2 DAYS  Final   Report Status PENDING  Incomplete  Urine culture     Status: Abnormal   Collection Time: 03/27/16  5:05 PM  Result Value Ref Range Status   Specimen Description URINE, CLEAN CATCH  Final   Special Requests NONE  Final   Culture MULTIPLE SPECIES PRESENT, SUGGEST RECOLLECTION (A)  Final   Report Status 03/28/2016 FINAL  Final  Blood Culture (routine x 2)     Status: None (Preliminary result)   Collection Time: 03/27/16  5:08 PM  Result Value Ref Range Status   Specimen Description BLOOD RIGHT HAND  Final   Special Requests IN PEDIATRIC BOTTLE 1CC  Final   Culture NO GROWTH 2 DAYS  Final   Report Status PENDING  Incomplete  MRSA PCR Screening     Status: None   Collection Time: 03/28/16  3:11 AM  Result Value Ref Range Status   MRSA by PCR  NEGATIVE NEGATIVE Final    Comment:        The GeneXpert MRSA Assay (FDA approved for NASAL specimens only), is one component of a comprehensive MRSA colonization surveillance program. It is not intended to diagnose MRSA infection nor to guide or monitor treatment for MRSA infections.      Labs: Basic Metabolic Panel:  Recent Labs Lab 03/27/16 1546 03/28/16 0128 03/29/16 0302 03/30/16 0254  NA 133* 138 141 140  K 3.8 3.8 3.3* 4.2  CL 102 109 108 109  CO2 23 21* 28 27  GLUCOSE 152* 154* 82 85  BUN 7 6 <5* <5*  CREATININE 0.80 0.80 0.71 0.71  CALCIUM 7.4* 6.9* 7.4* 7.9*  MG  --   --  1.5* 2.0   Liver Function Tests:  Recent Labs Lab 03/27/16 1546 03/28/16 0128  AST 50* 50*  ALT 42 37  ALKPHOS 120 108  BILITOT 0.5 0.4  PROT 5.2* 4.3*  ALBUMIN 1.9* 1.5*   No results for input(s): LIPASE, AMYLASE in the last 168 hours. No results for input(s): AMMONIA in the last 168 hours. CBC:  Recent Labs Lab 03/27/16 1546 03/28/16 0128 03/29/16 0302  WBC 10.1 9.1 6.5  NEUTROABS 7.9*  --   --   HGB 8.2* 7.5* 7.7*  HCT 24.9* 22.5* 23.3*  MCV 72.8* 72.3* 71.3*  PLT 440* 379 390   Cardiac Enzymes: No results for input(s): CKTOTAL, CKMB, CKMBINDEX, TROPONINI in the last 168 hours. BNP: BNP (last 3 results) No results for input(s): BNP in the last 8760 hours.  ProBNP (last 3 results) No results for input(s): PROBNP in the last 8760 hours.  CBG: No results for input(s): GLUCAP in the last 168 hours.     SignedFlorencia Reasons MD, PhD  [facility] 03/30/2016, 11:50 AM

## 2016-04-01 LAB — CULTURE, BLOOD (ROUTINE X 2)
CULTURE: NO GROWTH
CULTURE: NO GROWTH

## 2016-04-15 ENCOUNTER — Other Ambulatory Visit: Payer: BLUE CROSS/BLUE SHIELD

## 2016-04-15 ENCOUNTER — Ambulatory Visit: Payer: BLUE CROSS/BLUE SHIELD | Admitting: Oncology

## 2016-04-15 ENCOUNTER — Encounter: Payer: Self-pay | Admitting: Oncology

## 2016-04-15 NOTE — Progress Notes (Deleted)
OFFICE PROGRESS NOTE   April 15, 2016   Physicians:  INTERVAL HISTORY:     ONCOLOGIC HISTORY  No history exists.    Review of systems as above, also:  Remainder of 10 point Review of Systems negative.  Objective:  Vital signs in last 24 hours:  There were no vitals taken for this visit.  Alert, oriented and appropriate. Ambulatory without assistance difficulty.  Alopecia  HEENT:PERRL, sclerae not icteric. Oral mucosa moist without lesions, posterior pharynx clear.  Neck supple. No JVD.  Lymphatics:no cervical,suraclavicular, axillary or inguinal adenopathy Resp: clear to auscultation bilaterally and normal percussion bilaterally Cardio: regular rate and rhythm. No gallop. GI: soft, nontender, not distended, no mass or organomegaly. Normally active bowel sounds. Surgical incision not remarkable. Musculoskeletal/ Extremities: without pitting edema, cords, tenderness Neuro: no peripheral neuropathy. Otherwise nonfocal Skin without rash, ecchymosis, petechiae Breasts: without dominant mass, skin or nipple findings. Axillae benign. Portacath-without erythema or tenderness  Lab Results:  Results for orders placed or performed during the hospital encounter of 03/27/16  Blood Culture (routine x 2)  Result Value Ref Range   Specimen Description BLOOD RIGHT ANTECUBITAL    Special Requests BOTTLES DRAWN AEROBIC AND ANAEROBIC 5ML    Culture NO GROWTH 5 DAYS    Report Status 04/01/2016 FINAL   Blood Culture (routine x 2)  Result Value Ref Range   Specimen Description BLOOD RIGHT HAND    Special Requests IN PEDIATRIC BOTTLE 1CC    Culture NO GROWTH 5 DAYS    Report Status 04/01/2016 FINAL   Urine culture  Result Value Ref Range   Specimen Description URINE, CLEAN CATCH    Special Requests NONE    Culture MULTIPLE SPECIES PRESENT, SUGGEST RECOLLECTION (A)    Report Status 03/28/2016 FINAL   MRSA PCR Screening  Result Value Ref Range   MRSA by PCR NEGATIVE NEGATIVE   Comprehensive metabolic panel  Result Value Ref Range   Sodium 133 (L) 135 - 145 mmol/L   Potassium 3.8 3.5 - 5.1 mmol/L   Chloride 102 101 - 111 mmol/L   CO2 23 22 - 32 mmol/L   Glucose, Bld 152 (H) 65 - 99 mg/dL   BUN 7 6 - 20 mg/dL   Creatinine, Ser 0.80 0.44 - 1.00 mg/dL   Calcium 7.4 (L) 8.9 - 10.3 mg/dL   Total Protein 5.2 (L) 6.5 - 8.1 g/dL   Albumin 1.9 (L) 3.5 - 5.0 g/dL   AST 50 (H) 15 - 41 U/L   ALT 42 14 - 54 U/L   Alkaline Phosphatase 120 38 - 126 U/L   Total Bilirubin 0.5 0.3 - 1.2 mg/dL   GFR calc non Af Amer >60 >60 mL/min   GFR calc Af Amer >60 >60 mL/min   Anion gap 8 5 - 15  CBC with Differential  Result Value Ref Range   WBC 10.1 4.0 - 10.5 K/uL   RBC 3.42 (L) 3.87 - 5.11 MIL/uL   Hemoglobin 8.2 (L) 12.0 - 15.0 g/dL   HCT 24.9 (L) 36.0 - 46.0 %   MCV 72.8 (L) 78.0 - 100.0 fL   MCH 24.0 (L) 26.0 - 34.0 pg   MCHC 32.9 30.0 - 36.0 g/dL   RDW 16.9 (H) 11.5 - 15.5 %   Platelets 440 (H) 150 - 400 K/uL   Neutrophils Relative % 79 %   Neutro Abs 7.9 (H) 1.7 - 7.7 K/uL   Lymphocytes Relative 16 %   Lymphs Abs 1.6 0.7 - 4.0 K/uL  Monocytes Relative 5 %   Monocytes Absolute 0.5 0.1 - 1.0 K/uL   Eosinophils Relative 0 %   Eosinophils Absolute 0.0 0.0 - 0.7 K/uL   Basophils Relative 0 %   Basophils Absolute 0.0 0.0 - 0.1 K/uL  Urinalysis, Routine w reflex microscopic (not at [facility])  Result Value Ref Range   Color, Urine YELLOW YELLOW   APPearance CLEAR CLEAR   Specific Gravity, Urine 1.019 1.005 - 1.030   pH 5.5 5.0 - 8.0   Glucose, UA NEGATIVE NEGATIVE mg/dL   Hgb urine dipstick NEGATIVE NEGATIVE   Bilirubin Urine NEGATIVE NEGATIVE   Ketones, ur NEGATIVE NEGATIVE mg/dL   Protein, ur NEGATIVE NEGATIVE mg/dL   Nitrite NEGATIVE NEGATIVE   Leukocytes, UA TRACE (A) NEGATIVE  Urine microscopic-add on  Result Value Ref Range   Squamous Epithelial / LPF 6-30 (A) NONE SEEN   WBC, UA 0-5 0 - 5 WBC/hpf   RBC / HPF 0-5 0 - 5 RBC/hpf   Bacteria, UA FEW (A) NONE  SEEN   Casts HYALINE CASTS (A) NEGATIVE  Sedimentation rate  Result Value Ref Range   Sed Rate 4 0 - 22 mm/hr  C-reactive protein  Result Value Ref Range   CRP <0.5 <1.0 mg/dL  ANA, IFA (with reflex)  Result Value Ref Range   ANA Ab, IFA Negative   HIV antibody  Result Value Ref Range   HIV Screen 4th Generation wRfx Non Reactive Non Reactive  Hepatitis panel, acute  Result Value Ref Range   Hepatitis B Surface Ag Negative Negative   HCV Ab <0.1 0.0 - 0.9 s/co ratio   Hep A IgM Negative Negative   Hep B C IgM Negative Negative  Urine rapid drug screen (hosp performed)  Result Value Ref Range   Opiates NONE DETECTED NONE DETECTED   Cocaine NONE DETECTED NONE DETECTED   Benzodiazepines NONE DETECTED NONE DETECTED   Amphetamines NONE DETECTED NONE DETECTED   Tetrahydrocannabinol NONE DETECTED NONE DETECTED   Barbiturates NONE DETECTED NONE DETECTED  CBC  Result Value Ref Range   WBC 9.1 4.0 - 10.5 K/uL   RBC 3.11 (L) 3.87 - 5.11 MIL/uL   Hemoglobin 7.5 (L) 12.0 - 15.0 g/dL   HCT 22.5 (L) 36.0 - 46.0 %   MCV 72.3 (L) 78.0 - 100.0 fL   MCH 24.1 (L) 26.0 - 34.0 pg   MCHC 33.3 30.0 - 36.0 g/dL   RDW 17.0 (H) 11.5 - 15.5 %   Platelets 379 150 - 400 K/uL  Comprehensive metabolic panel  Result Value Ref Range   Sodium 138 135 - 145 mmol/L   Potassium 3.8 3.5 - 5.1 mmol/L   Chloride 109 101 - 111 mmol/L   CO2 21 (L) 22 - 32 mmol/L   Glucose, Bld 154 (H) 65 - 99 mg/dL   BUN 6 6 - 20 mg/dL   Creatinine, Ser 0.80 0.44 - 1.00 mg/dL   Calcium 6.9 (L) 8.9 - 10.3 mg/dL   Total Protein 4.3 (L) 6.5 - 8.1 g/dL   Albumin 1.5 (L) 3.5 - 5.0 g/dL   AST 50 (H) 15 - 41 U/L   ALT 37 14 - 54 U/L   Alkaline Phosphatase 108 38 - 126 U/L   Total Bilirubin 0.4 0.3 - 1.2 mg/dL   GFR calc non Af Amer >60 >60 mL/min   GFR calc Af Amer >60 >60 mL/min   Anion gap 8 5 - 15  TSH  Result Value Ref Range  TSH 2.788 0.350 - 4.500 uIU/mL  Vitamin B12  Result Value Ref Range   Vitamin B-12 919 (H)  180 - 914 pg/mL  Folate  Result Value Ref Range   Folate 39.0 >5.9 ng/mL  Iron and TIBC  Result Value Ref Range   Iron 83 28 - 170 ug/dL   TIBC NOT CALCULATED 250 - 450 ug/dL   Saturation Ratios NOT CALCULATED 10.4 - 31.8 %   UIBC NOT CALCULATED ug/dL  D-dimer, quantitative (not at [facility])  Result Value Ref Range   D-Dimer, Quant 0.32 0.00 - 0.50 ug/mL-FEU  Protime-INR  Result Value Ref Range   Prothrombin Time 16.3 (H) 11.4 - 15.2 seconds   INR 1.30   Procalcitonin - Baseline  Result Value Ref Range   Procalcitonin 0.15 ng/mL  Lactic acid, plasma  Result Value Ref Range   Lactic Acid, Venous 2.0 (HH) 0.5 - 1.9 mmol/L  Procalcitonin  Result Value Ref Range   Procalcitonin 0.26 ng/mL  CBC  Result Value Ref Range   WBC 6.5 4.0 - 10.5 K/uL   RBC 3.27 (L) 3.87 - 5.11 MIL/uL   Hemoglobin 7.7 (L) 12.0 - 15.0 g/dL   HCT 23.3 (L) 36.0 - 46.0 %   MCV 71.3 (L) 78.0 - 100.0 fL   MCH 23.5 (L) 26.0 - 34.0 pg   MCHC 33.0 30.0 - 36.0 g/dL   RDW 17.1 (H) 11.5 - 15.5 %   Platelets 390 150 - 400 K/uL  Basic metabolic panel  Result Value Ref Range   Sodium 141 135 - 145 mmol/L   Potassium 3.3 (L) 3.5 - 5.1 mmol/L   Chloride 108 101 - 111 mmol/L   CO2 28 22 - 32 mmol/L   Glucose, Bld 82 65 - 99 mg/dL   BUN <5 (L) 6 - 20 mg/dL   Creatinine, Ser 0.71 0.44 - 1.00 mg/dL   Calcium 7.4 (L) 8.9 - 10.3 mg/dL   GFR calc non Af Amer >60 >60 mL/min   GFR calc Af Amer >60 >60 mL/min   Anion gap 5 5 - 15  Magnesium  Result Value Ref Range   Magnesium 1.5 (L) 1.7 - 2.4 mg/dL  Lactic acid, plasma  Result Value Ref Range   Lactic Acid, Venous 2.2 (HH) 0.5 - 1.9 mmol/L  Basic metabolic panel  Result Value Ref Range   Sodium 140 135 - 145 mmol/L   Potassium 4.2 3.5 - 5.1 mmol/L   Chloride 109 101 - 111 mmol/L   CO2 27 22 - 32 mmol/L   Glucose, Bld 85 65 - 99 mg/dL   BUN <5 (L) 6 - 20 mg/dL   Creatinine, Ser 0.71 0.44 - 1.00 mg/dL   Calcium 7.9 (L) 8.9 - 10.3 mg/dL   GFR calc non Af Amer >60  >60 mL/min   GFR calc Af Amer >60 >60 mL/min   Anion gap 4 (L) 5 - 15  Magnesium  Result Value Ref Range   Magnesium 2.0 1.7 - 2.4 mg/dL  Lipid panel  Result Value Ref Range   Cholesterol 42 0 - 200 mg/dL   Triglycerides 42 <150 mg/dL   HDL 10 (L) >40 mg/dL   Total CHOL/HDL Ratio 4.2 RATIO   VLDL 8 0 - 40 mg/dL   LDL Cholesterol 24 0 - 99 mg/dL  I-Stat beta hCG blood, ED  Result Value Ref Range   I-stat hCG, quantitative <5.0 <5 mIU/mL   Comment 3  I-Stat CG4 Lactic Acid, ED  Result Value Ref Range   Lactic Acid, Venous 2.54 (HH) 0.5 - 1.9 mmol/L   Comment NOTIFIED PHYSICIAN   I-Stat CG4 Lactic Acid, ED  (not at  [facility]  Result Value Ref Range   Lactic Acid, Venous 4.30 (HH) 0.5 - 1.9 mmol/L   Comment NOTIFIED PHYSICIAN   I-Stat CG4 Lactic Acid, ED  (not at  [facility])  Result Value Ref Range   Lactic Acid, Venous 2.11 (HH) 0.5 - 1.9 mmol/L  I-Stat CG4 Lactic Acid, ED  Result Value Ref Range   Lactic Acid, Venous 2.91 (HH) 0.5 - 1.9 mmol/L   Comment NOTIFIED PHYSICIAN   ECHOCARDIOGRAM COMPLETE  Result Value Ref Range   Weight 2,391.55 oz   Height 65 in   BP 122/80 mmHg   LV PW d 9.63 (A) 0.6 - 1.1 mm   FS 32 28 - 44 %   LA vol 33.3 mL   LA ID, A-P, ES 26 mm   IVS/LV PW RATIO, ED .8    LV e' LATERAL 14.5 cm/s   LV E/e' medial 4.59    LV E/e'average 4.59    LA diam index 1.49 cm/m2   LA vol A4C 23.1 ml   E decel time 264 msec   LVOT diameter 20 mm   LVOT area 3.14 cm2   E/e' ratio 4.59    MV pk E vel 66.5 m/s   MV pk A vel 55.3 m/s   LA vol index 19.0 mL/m2   MV Dec 264    LA diam end sys 26.00 mm   TDI e' medial 8.92    TDI e' lateral 14.50    Lateral S' vel 12.10 cm/sec   TAPSE 18.10 mm     Studies/Results:  No results found.  Medications: I have reviewed the patient's current medications.  DISCUSSION  Assessment/Plan:        Evlyn Clines, MD   04/15/2016, 7:23 PM

## 2016-04-28 IMAGING — MR MR BREAST BILATERAL W WO CONTRAST
8 of 13 series · 31 of 48 positions shown · IV contrast (14ml Multihance)
Comparison: Previous exams

CLINICAL DATA: Patient is a 38-year-old female with a personal
history of right breast cancer status post mastectomy with implant
reconstruction in 0466. Patient reports pain at the implant.

LABS:  None today.
EXAM:
BILATERAL BREAST MRI WITH AND WITHOUT CONTRAST
TECHNIQUE: Multiplanar, multisequence MR images of both breasts were obtained
prior to and following the intravenous administration of 14ml of
MultiHance.

[Series 2: T2 · axial · 3.0mm · 0.94mm/px · z∈[-106,+71]mm · 3 of 60 slices shown]
[im 1/60]
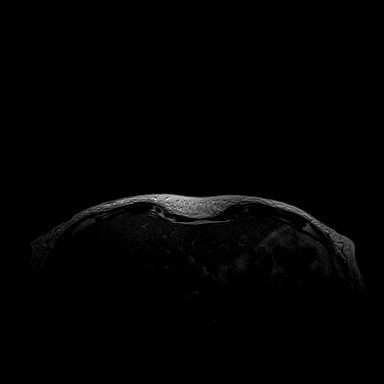
[im 30/60]
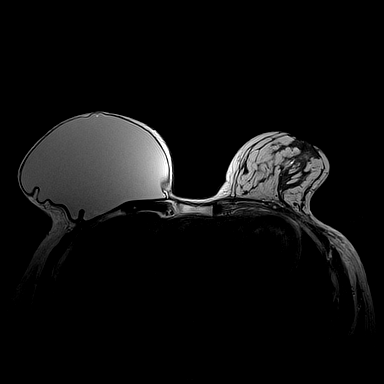
[im 60/60]
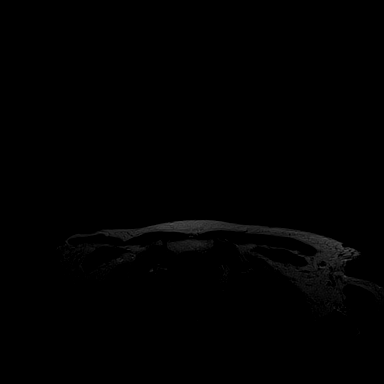

[Series 3: t2_tirm_tra ipat (a-p) · axial · 3.0mm · 0.70mm/px · z∈[-106,+71]mm · 2 of 60 slices shown]
[im 1/60]
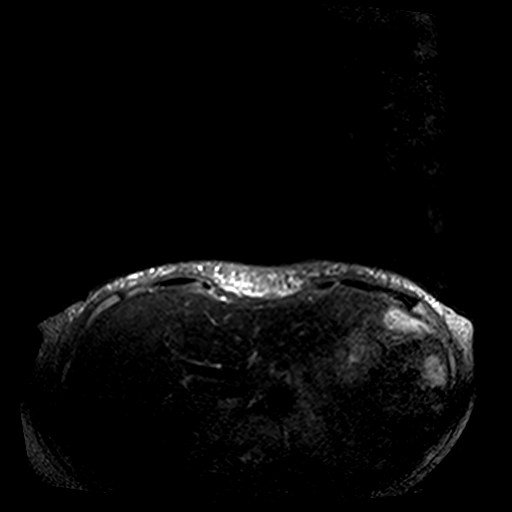
[im 60/60]
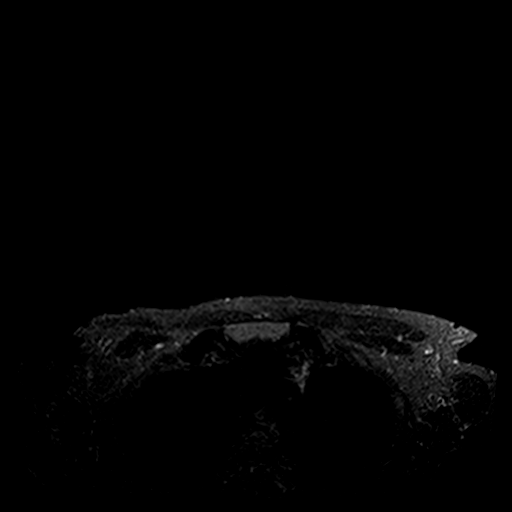

[Series 4: fl3d pre-cm no · axial · non-contrast · 1.2mm · 0.94mm/px · z∈[-104,+68]mm · 5 of 144 slices shown]
[im 1/144]
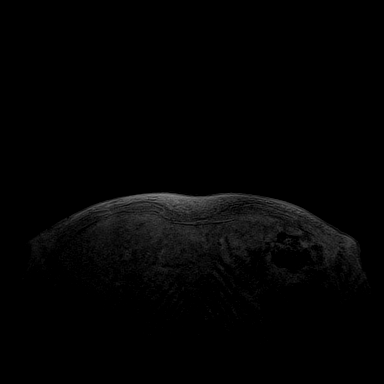
[im 36/144]
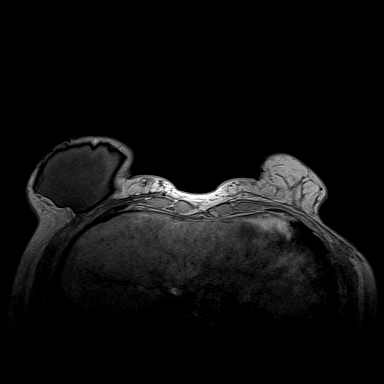
[im 72/144]
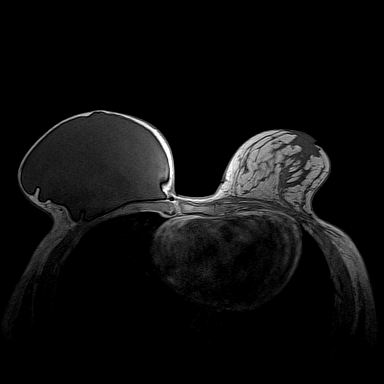
[im 108/144]
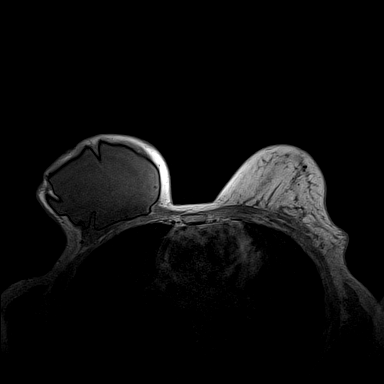
[im 144/144]
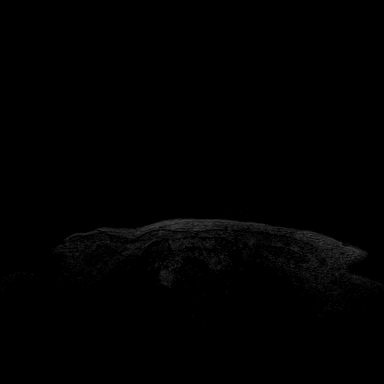

[Series 5: fl3d pre-cm · axial · non-contrast · 1.2mm · 0.94mm/px · z∈[-104,+68]mm · 5 of 144 slices shown]
[im 1/144]
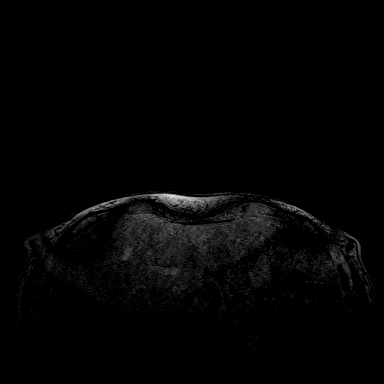
[im 36/144]
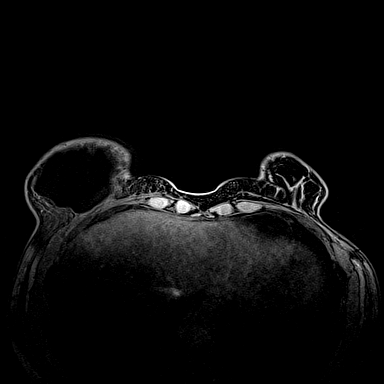
[im 72/144]
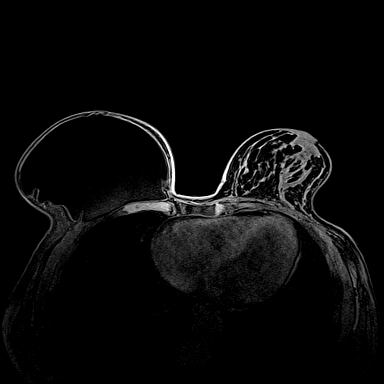
[im 108/144]
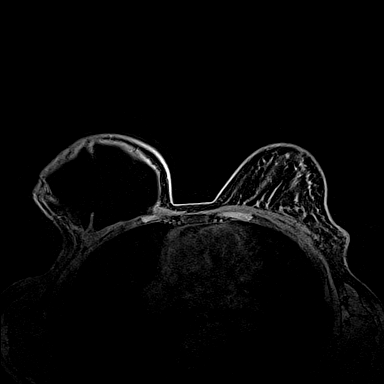
[im 144/144]
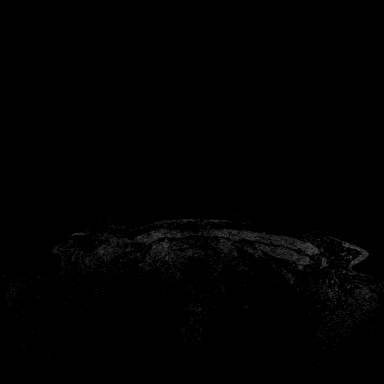

[Series 6: fl3d post-cm 20 · axial · 1.2mm · 0.94mm/px · z∈[-104,+68]mm · 5 of 144 slices shown (1 of 3)]
[im 1/144]
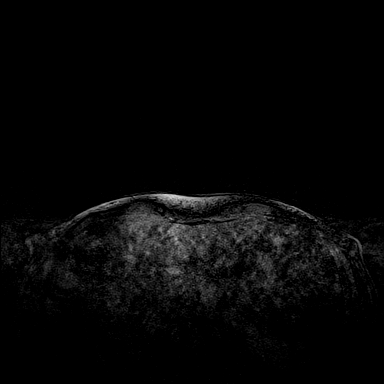
[im 36/144]
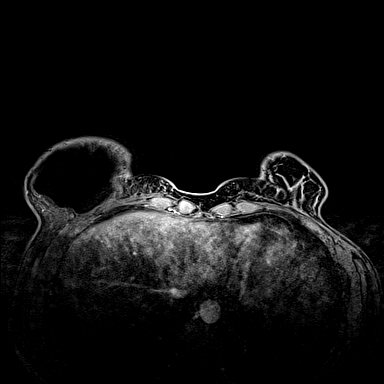
[im 72/144]
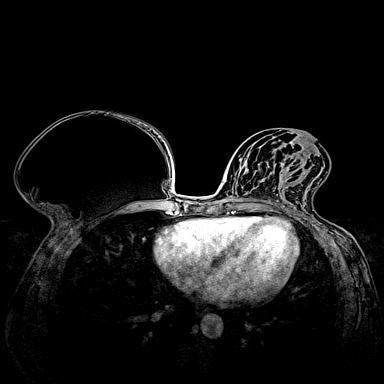
[im 108/144]
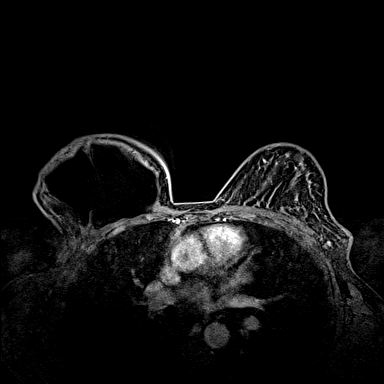
[im 144/144]
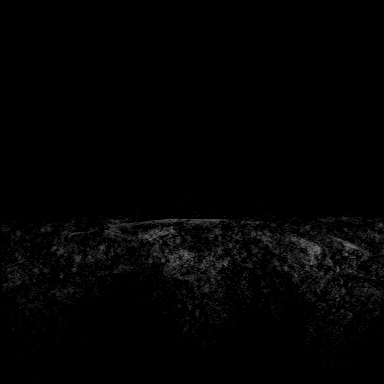

[Series 7: fl3d post-cm 20 · axial · 1.2mm · 0.94mm/px · z∈[-104,+68]mm · 5 of 144 slices shown (2 of 3)]
[im 1/144]
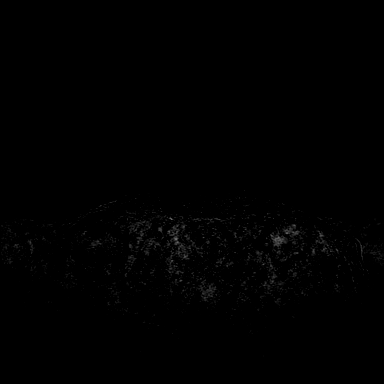
[im 36/144]
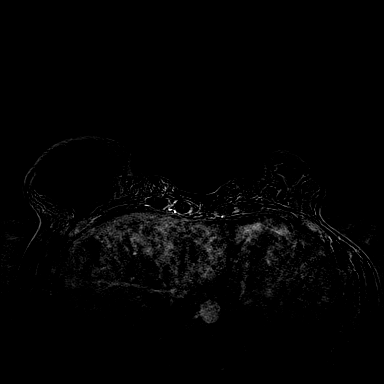
[im 72/144]
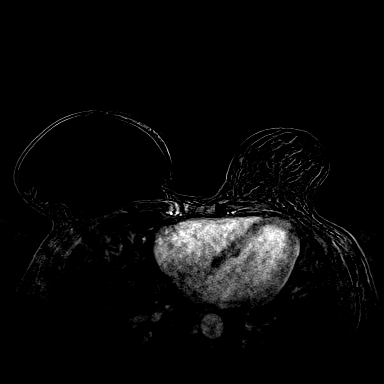
[im 108/144]
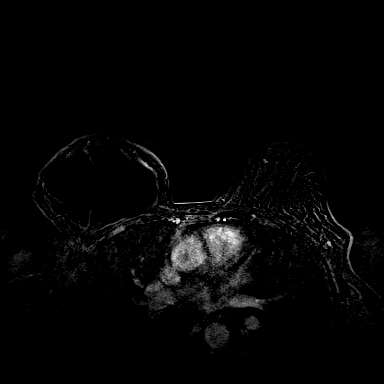
[im 144/144]
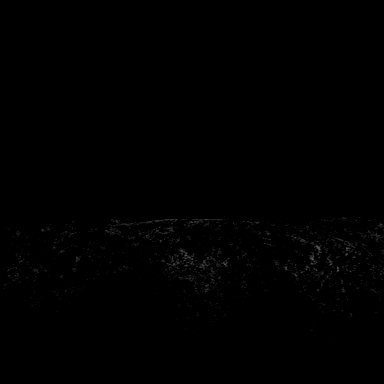

[Series 8: fl3d post-cm 20 · axial · 172.8mm · 0.94mm/px · 1 of 1 slices shown (3 of 3)]
[im 1/1]
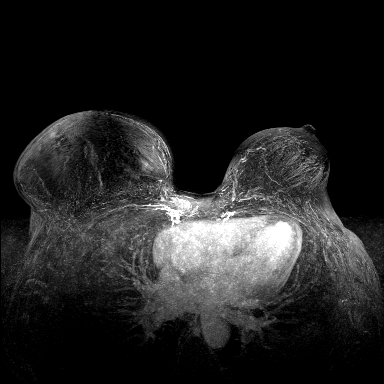

[Series 9: fl3d post-cm 3min · axial · 1.2mm · 0.94mm/px · z∈[-104,+68]mm · 5 of 144 slices shown]
[im 1/144]
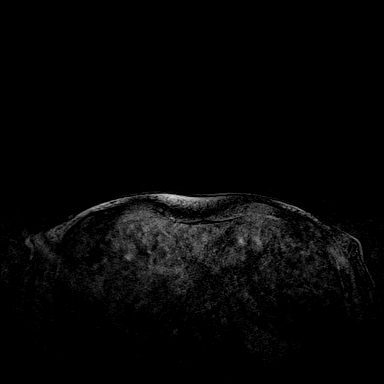
[im 36/144]
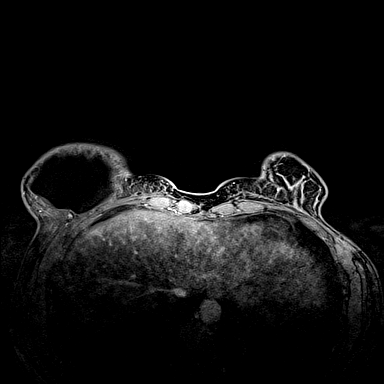
[im 72/144]
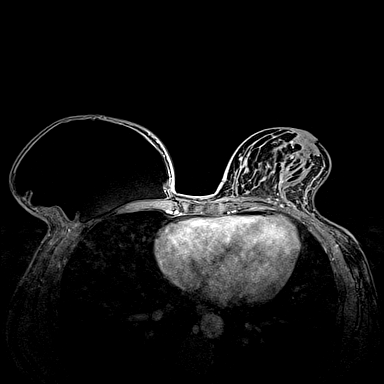
[im 108/144]
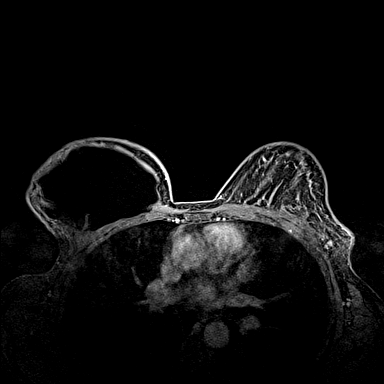
[im 144/144]
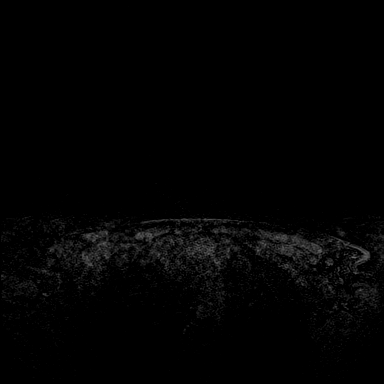

[31 of 48 positions shown; findings below may reference images not displayed]

THREE-DIMENSIONAL MR IMAGE RENDERING ON INDEPENDENT WORKSTATION:

Three-dimensional MR images were rendered by post-processing of the
original MR data on an independent workstation. The
three-dimensional MR images were interpreted, and findings are
reported in the following complete MRI report for this study. Three
dimensional images were evaluated at the independent [employer]
workstation
FINDINGS: Breast composition: Patient is status post right mastectomy with
implant reconstruction. The left breast demonstrates heterogeneous
fibroglandular tissue.

Background parenchymal enhancement: There is a moderate degree of
background parenchymal enhancement with scattered foci of non mass
enhancement with benign MR features in the left breast.

Right breast: Status post right mastectomy with silicone implant
reconstruction. Silicone sensitive sequences are not included in
today's study to evaluate for implant integrity. In the region of
palpable concern along the upper-outer implant capsule, note is made
of a probable capsular cyst or implant herniation (a form of
intracapsular rupture) which is a benign finding. No abnormal
enhancement.

Left breast: No abnormal mass or suspicious enhancement.

Lymph nodes: No abnormal appearing lymph nodes.

Ancillary findings:  None.
IMPRESSION: 1. Status post right mastectomy without MRI findings of new or
recurrent malignancy.
2. Probable intracapsular rupture along the upper-outer implant
capsule is in the region of palpable concern. This is a benign
finding. If further evaluation is clinically indicated or if there
is desire for implant replacement, noncontrast breast MR with
implant protocol may be considered.
3. No axillary adenopathy.

RECOMMENDATION:
1. There is a probable intracapsular rupture along the upper-outer
implant capsule the region of palpable concern. If further
evaluation is clinically indicated or if there is desire for implant
replacement, a noncontrast breast MR with implant protocol may be
considered.
2. Recommend annual diagnostic mammography.

BI-RADS CATEGORY  2: Benign.

## 2017-03-25 ENCOUNTER — Ambulatory Visit (HOSPITAL_COMMUNITY)
Admission: EM | Admit: 2017-03-25 | Discharge: 2017-03-25 | Disposition: A | Discharge status: Home / Self Care | Payer: Self-pay

## 2017-03-25 ENCOUNTER — Encounter (HOSPITAL_COMMUNITY): Payer: Self-pay | Admitting: Emergency Medicine

## 2017-03-25 DIAGNOSIS — M76891 Other specified enthesopathies of right lower limb, excluding foot: Secondary | ICD-10-CM

## 2017-03-25 NOTE — ED Provider Notes (Signed)
[place]    CSN: [id number] Arrival date & time: 03/25/17  1409     History   Chief Complaint Chief Complaint  Patient presents with  . Knee Pain    HPI Cheryl Holloway is a 41 y.o. female.    41 year old females complaining of right knee pain for the past 2 weeks. She has 2 jobs that require prolonged standing and walking for up to 15 hours a day several days a week. It is gradually getting worse with weightbearing and ambulation. She also notes the chest and swelling in the knee as well. She points to the distal quadriceps muscles/tendons and the patellar tendon as the primary source of pain. Denies any known injury, fall or blunt trauma.      Past Medical History:  Diagnosis Date  . Anemia    takes iron  . Breast cancer ([place])   . Fibroids    uterine    Patient Active Problem List   Diagnosis Date Noted  . Sepsis ([place]) 03/27/2016  . Acute GI bleeding 01/01/2016  . Fatty liver 01/01/2016  . UGIB (upper gastrointestinal bleed) 01/01/2016  . Acute blood loss anemia 09/30/2015  . Malignant neoplasm of right female breast ([place]) 09/30/2015  . Need for prophylactic vaccination and inoculation against influenza 09/30/2015  . Unintentional weight loss 09/30/2015  . S/P mastectomy 04/25/2014  . Breast cancer, right ([place]) 01/27/2014  . Gastric bypass status for obesity 01/27/2014  . Iron deficiency anemia due to chronic blood loss 01/27/2014    Past Surgical History:  Procedure Laterality Date  . BREAST IMPLANT REMOVAL Right 04/25/2014   Procedure: REMOVAL BREAST IMPLANT WITH CAPSULECTOMY;  Surgeon: Theodoro Kos, DO;  Location: [facility];  Service: Plastics;  Laterality: Right;  . BREAST RECONSTRUCTION    . BREAST REDUCTION SURGERY    . CESAREAN SECTION     x3  . ESOPHAGOGASTRODUODENOSCOPY N/A 01/02/2016   Procedure: ESOPHAGOGASTRODUODENOSCOPY (EGD);  Surgeon: Clarene Essex, MD;  Location: [facility] ENDOSCOPY;  Service: Endoscopy;  Laterality: N/A;    . GASTRIC BYPASS    . MASTECTOMY     right  . SIMPLE MASTECTOMY WITH AXILLARY SENTINEL NODE BIOPSY Left 04/25/2014   Procedure: LEFT TOTAL  MASTECTOMY (PROPHYLACTIC);  Surgeon: Rolm Bookbinder, MD;  Location: [facility];  Service: General;  Laterality: Left;  . TUBAL LIGATION      OB History    No data available       Home Medications    Prior to Admission medications   Not on File    Family History History reviewed. No pertinent family history.  Social History Social History  Substance Use Topics  . Smoking status: Never Smoker  . Smokeless tobacco: Never Used  . Alcohol use Yes     Comment: social     Allergies   Patient has no known allergies.   Review of Systems Review of Systems  Constitutional: Negative for activity change, chills and fever.  HENT: Negative.   Respiratory: Negative.   Cardiovascular: Negative.   Musculoskeletal:       As per HPI  Skin: Negative for color change, pallor and rash.  Neurological: Negative.   All other systems reviewed and are negative.    Physical Exam Triage Vital Signs ED Triage Vitals  Enc Vitals Group     BP 03/25/17 1431 (!) 147/88     Pulse Rate 03/25/17 1431 80     Resp 03/25/17 1431 18     Temp 03/25/17  1431 97.9 F (36.6 C)     Temp Source 03/25/17 1431 Oral     SpO2 03/25/17 1431 99 %     Weight --      Height --      Head Circumference --      Peak Flow --      Pain Score 03/25/17 1432 7     Pain Loc --      Pain Edu? --      Excl. in [place]? --    No data found.   Updated Vital Signs BP (!) 147/88 (BP Location: Left Arm)   Pulse 80   Temp 97.9 F (36.6 C) (Oral)   Resp 18   SpO2 99%   Visual Acuity Right Eye Distance:   Left Eye Distance:   Bilateral Distance:    Right Eye Near:   Left Eye Near:    Bilateral Near:     Physical Exam  Constitutional: She is oriented to person, place, and time. She appears well-developed and well-nourished. No distress.  HENT:   Head: Normocephalic and atraumatic.  Eyes: EOM are normal.  Neck: Neck supple.  Musculoskeletal:  Normal range of motion. Tenderness to the distal quadriceps tendons and the patellar tendon proximal and distal. No tenderness to the patella itself. Minor tenderness to the bilateral tibial and femoral condyles. There does not appear to be small and effusion and some swelling around the knee. Negative varus or valgus test, negative drawer. No laxity appreciated. Distal neurovascular motor sensory is grossly intact.  Neurological: She is alert and oriented to person, place, and time. No cranial nerve deficit.  Skin: Skin is warm and dry.  Psychiatric: She has a normal mood and affect.     UC Treatments / Results  Labs (all labs ordered are listed, but only abnormal results are displayed) Labs Reviewed - No data to display  EKG  EKG Interpretation None       Radiology No results found.  Procedures Procedures (including critical care time)  Medications Ordered in UC Medications - No data to display   Initial Impression / Assessment and Plan / UC Course  I have reviewed the triage vital signs and the nursing notes.  Pertinent labs & imaging results that were available during my care of the patient were reviewed by me and considered in my medical decision making (see chart for details).     No work for the next 3 days. Limit weightbearing. Apply ice to the knee 3-4 times a day for 20-30 minutes at a time. Continue to the hand and stretch and extend the knee while sitting. You may take Aleve for pain. If you are not getting better or you get worse he may need to follow-up with orthopedist. And as he stated you may need to quit that second job to diminish the amount of time your spending on your feet   Final Clinical Impressions(s) / UC Diagnoses   Final diagnoses:  Tendinitis of right knee    New Prescriptions New Prescriptions   No medications on file     Controlled  Substance Prescriptions  Controlled Substance Registry consulted? Not Applicable   Cheryl Napoleon, NP 03/25/17 1510

## 2017-03-25 NOTE — ED Triage Notes (Signed)
The patient presented to the [facility] with a complaint of right knee pain x 2 weeks. The patient denied any known injury.

## 2017-03-25 NOTE — Discharge Instructions (Signed)
No work for the next 3 days. Limit weightbearing. Apply ice to the knee 3-4 times a day for 20-30 minutes at a time. Continue to the hand and stretch and extend the knee while sitting. You may take Aleve for pain. If you are not getting better or you get worse he may need to follow-up with orthopedist. And as he stated you may need to quit that second job to diminish the amount of time your spending on your feet

## 2019-04-09 ENCOUNTER — Ambulatory Visit (HOSPITAL_COMMUNITY)
Admission: EM | Admit: 2019-04-09 | Discharge: 2019-04-09 | Disposition: A | Discharge status: Home / Self Care | Payer: Self-pay | Attending: Family Medicine | Admitting: Family Medicine

## 2019-04-09 ENCOUNTER — Encounter (HOSPITAL_COMMUNITY): Payer: Self-pay

## 2019-04-09 ENCOUNTER — Ambulatory Visit (INDEPENDENT_AMBULATORY_CARE_PROVIDER_SITE_OTHER): Payer: Self-pay

## 2019-04-09 ENCOUNTER — Other Ambulatory Visit: Payer: Self-pay

## 2019-04-09 DIAGNOSIS — K920 Hematemesis: Secondary | ICD-10-CM

## 2019-04-09 LAB — CBC
HCT: 39.2 % (ref 36.0–46.0)
Hemoglobin: 12.4 g/dL (ref 12.0–15.0)
MCH: 26 pg (ref 26.0–34.0)
MCHC: 31.6 g/dL (ref 30.0–36.0)
MCV: 82.2 fL (ref 80.0–100.0)
Platelets: 291 10*3/uL (ref 150–400)
RBC: 4.77 MIL/uL (ref 3.87–5.11)
RDW: 17.6 % — ABNORMAL HIGH (ref 11.5–15.5)
WBC: 4.6 10*3/uL (ref 4.0–10.5)
nRBC: 0 % (ref 0.0–0.2)

## 2019-04-09 NOTE — ED Triage Notes (Signed)
Patient presents to Urgent Care with complaints of coughing up blood one day last week and having some blood in her vomitus last week aswell. Patient reports she has a hx of breast cancer and her mother is worried the cancer may be back to some extent. Pt thinks she may have a gastric ulcer.

## 2019-04-09 NOTE — ED Provider Notes (Signed)
[place]    CSN: [id number] Arrival date & time: 04/09/19  1000      History   Chief Complaint Chief Complaint  Patient presents with  . Coughing up blood    HPI Cheryl Holloway is a 43 y.o. female.   HPI  Patient is here for coughing up blood.  She states actually she is vomited blood twice, and coughed up once.  Each time it which is small amounts of blood streaks in the emesis or in the sputum.  She thinks it is from her stomach.  She has a history of gastric bypass.  She has a history of a bleeding ulcer or anastomosis.  She has been having heartburn.  She has not been taking medication.  She has not had pain.  She has not had any change in her appetite.  No fever or chills.  No change in weight.  No change in bowels.  No coughing or sputum.  No fever or chills. Patient is a breast cancer survivor.  Her mother insisted she come in to be seen.  She also has a history of anemia. She does have a history of GERD.  Gastric bypass.  GI bleeding in the past.  She has not been taking anti-inflammatories.  No alcohol  Past Medical History:  Diagnosis Date  . Anemia    takes iron  . Breast cancer ([place])   . Fibroids    uterine    Patient Active Problem List   Diagnosis Date Noted  . Sepsis ([place]) 03/27/2016  . Acute GI bleeding 01/01/2016  . Fatty liver 01/01/2016  . UGIB (upper gastrointestinal bleed) 01/01/2016  . Acute blood loss anemia 09/30/2015  . Malignant neoplasm of right female breast ([place]) 09/30/2015  . Need for prophylactic vaccination and inoculation against influenza 09/30/2015  . Unintentional weight loss 09/30/2015  . S/P mastectomy 04/25/2014  . Breast cancer, right ([place]) 01/27/2014  . Gastric bypass status for obesity 01/27/2014  . Iron deficiency anemia due to chronic blood loss 01/27/2014    Past Surgical History:  Procedure Laterality Date  . BREAST IMPLANT REMOVAL Right 04/25/2014   Procedure: REMOVAL BREAST IMPLANT WITH CAPSULECTOMY;   Surgeon: Theodoro Kos, DO;  Location: [facility];  Service: Plastics;  Laterality: Right;  . BREAST RECONSTRUCTION    . BREAST REDUCTION SURGERY    . CESAREAN SECTION     x3  . ESOPHAGOGASTRODUODENOSCOPY N/A 01/02/2016   Procedure: ESOPHAGOGASTRODUODENOSCOPY (EGD);  Surgeon: Clarene Essex, MD;  Location: [facility] ENDOSCOPY;  Service: Endoscopy;  Laterality: N/A;  . GASTRIC BYPASS    . MASTECTOMY     right  . SIMPLE MASTECTOMY WITH AXILLARY SENTINEL NODE BIOPSY Left 04/25/2014   Procedure: LEFT TOTAL  MASTECTOMY (PROPHYLACTIC);  Surgeon: Rolm Bookbinder, MD;  Location: [facility];  Service: General;  Laterality: Left;  . TUBAL LIGATION      OB History   No obstetric history on file.      Home Medications    Prior to Admission medications   Not on File    Family History Family History  Problem Relation Age of Onset  . Healthy Mother     Social History Social History   Tobacco Use  . Smoking status: Never Smoker  . Smokeless tobacco: Never Used  Substance Use Topics  . Alcohol use: Yes    Comment: social  . Drug use: No     Allergies   Patient has no known allergies.   Review  of Systems Review of Systems  Constitutional: Negative for chills and fever.  HENT: Negative for ear pain and sore throat.   Eyes: Negative for pain and visual disturbance.  Respiratory: Positive for cough. Negative for shortness of breath.   Cardiovascular: Negative for chest pain and palpitations.  Gastrointestinal: Positive for abdominal pain and vomiting.  Genitourinary: Negative for dysuria and hematuria.  Musculoskeletal: Negative for arthralgias and back pain.  Skin: Negative for color change and rash.  Neurological: Negative for seizures and syncope.  All other systems reviewed and are negative.    Physical Exam Triage Vital Signs ED Triage Vitals  Enc Vitals Group     BP 04/09/19 1043 (!) 165/95     Pulse Rate 04/09/19 1043 76     Resp 04/09/19  1043 15     Temp 04/09/19 1043 97.9 F (36.6 C)     Temp Source 04/09/19 1043 Oral     SpO2 04/09/19 1043 100 %     Weight --      Height --      Head Circumference --      Peak Flow --      Pain Score 04/09/19 1042 0     Pain Loc --      Pain Edu? --      Excl. in [place]? --    No data found.  Updated Vital Signs BP (!) 165/95 (BP Location: Left Arm)   Pulse 76   Temp 97.9 F (36.6 C) (Oral)   Resp 15   LMP 03/14/2019 (Approximate) Comment: irregular periods  SpO2 100%     Physical Exam Constitutional:      General: She is not in acute distress.    Appearance: She is well-developed and normal weight.  HENT:     Head: Normocephalic and atraumatic.     Right Ear: Tympanic membrane and ear canal normal.     Left Ear: Tympanic membrane and ear canal normal.     Mouth/Throat:     Mouth: Mucous membranes are moist.  Eyes:     Conjunctiva/sclera: Conjunctivae normal.     Pupils: Pupils are equal, round, and reactive to light.  Neck:     Musculoskeletal: Normal range of motion and neck supple.  Cardiovascular:     Rate and Rhythm: Normal rate and regular rhythm.     Heart sounds: Normal heart sounds.  Pulmonary:     Effort: Pulmonary effort is normal. No respiratory distress.     Breath sounds: Normal breath sounds.     Comments: Lungs are completely clear Abdominal:     General: Abdomen is flat. There is no distension.     Palpations: Abdomen is soft.     Comments: Abdomen is soft and nontender  Musculoskeletal: Normal range of motion.  Skin:    General: Skin is warm and dry.  Neurological:     Mental Status: She is alert.      UC Treatments / Results  Labs (all labs ordered are listed, but only abnormal results are displayed) Labs Reviewed  CBC - Abnormal; Notable for the following components:      Result Value   RDW 17.6 (*)    All other components within normal limits    EKG   Radiology Dg Chest 2 View  Result Date: 04/09/2019 CLINICAL DATA:   Hemoptysis EXAM: CHEST - 2 VIEW COMPARISON:  03/27/2016 FINDINGS: The heart size and mediastinal contours are within normal limits. Both lungs are clear. The visualized skeletal  structures are unremarkable. IMPRESSION: No acute abnormality of the lungs. No radiographic findings to explain hemoptysis. Electronically Signed   By: Eddie Candle M.D.   On: 04/09/2019 12:44    Procedures Procedures (including critical care time)  Medications Ordered in UC Medications - No data to display  Initial Impression / Assessment and Plan / UC Course  I have reviewed the triage vital signs and the nursing notes.  Pertinent labs & imaging results that were available during my care of the patient were reviewed by me and considered in my medical decision making (see chart for details).  Clinical Course as of Apr 08 1346  Mon Apr 09, 2019  1210 CBC [YN]    Clinical Course User Index [YN] Raylene Everts, MD    By my her medical history I think it is likely she is having some GI bleeding.  She has been having some heartburn.  She may have another bleeding ulcer.  I let her know that this requires medical care.  She needs to be on double dose omeprazole for a while and then single dose.  She really should be under the care of her primary care doctor.  She likely needs to under the care of a GI doctor.  She is currently without insurance.  She states this year she can secure health insurance she will call for additional medical care. Final Clinical Impressions(s) / UC Diagnoses   Final diagnoses:  Hematemesis, presence of nausea not specified     Discharge Instructions     Take omeprazole 2o milligrams twice a day 1 month.  After this take it once a day When your symptoms go away you can start taking it as needed  Your chest x-ray is normal You are blood count is normal.  No evidence of anemia   ED Prescriptions    None     Controlled Substance Prescriptions [place] Controlled Substance Registry  consulted? Not Applicable   Raylene Everts, MD 04/09/19 1350

## 2019-04-09 NOTE — Discharge Instructions (Addendum)
Take omeprazole 2o milligrams twice a day 1 month.  After this take it once a day When your symptoms go away you can start taking it as needed  Your chest x-ray is normal You are blood count is normal.  No evidence of anemia

## 2019-10-31 ENCOUNTER — Encounter (HOSPITAL_COMMUNITY): Payer: Self-pay | Admitting: Emergency Medicine

## 2019-10-31 ENCOUNTER — Other Ambulatory Visit: Payer: Self-pay

## 2019-10-31 ENCOUNTER — Ambulatory Visit (HOSPITAL_COMMUNITY)
Admission: EM | Admit: 2019-10-31 | Discharge: 2019-10-31 | Disposition: A | Discharge status: Home / Self Care | Payer: Self-pay

## 2019-10-31 DIAGNOSIS — F419 Anxiety disorder, unspecified: Secondary | ICD-10-CM

## 2019-10-31 DIAGNOSIS — Z9010 Acquired absence of unspecified breast and nipple: Secondary | ICD-10-CM

## 2019-10-31 DIAGNOSIS — R5383 Other fatigue: Secondary | ICD-10-CM

## 2019-10-31 DIAGNOSIS — G4700 Insomnia, unspecified: Secondary | ICD-10-CM

## 2019-10-31 DIAGNOSIS — R232 Flushing: Secondary | ICD-10-CM

## 2019-10-31 DIAGNOSIS — Z853 Personal history of malignant neoplasm of breast: Secondary | ICD-10-CM

## 2019-10-31 MED ORDER — HYDROXYZINE HCL 25 MG PO TABS
12.5000 mg | ORAL_TABLET | Freq: Three times a day (TID) | ORAL | 0 refills | Status: DC | PRN
Start: 2019-10-31 — End: 2020-10-09

## 2019-10-31 NOTE — ED Triage Notes (Addendum)
Lethargy, night sweats, loss of appetite, loss of sleep for the last few weeks. History of breast cancer, requests blood work.

## 2019-10-31 NOTE — ED Provider Notes (Signed)
[place]   MRN: [id number] DOB: Jan 27, 1976  Subjective:   Cheryl Holloway is a 44 y.o. female presenting for several week history of intermittent hot flashes, fatigue, anxiety, insomnia, decreased appetite.  Patient is anxious about her health, has a history of breast cancer and is status post mastectomy.  She was consulted on having a hysterectomy but she was not able to afford this as it was considered an elective surgery.  She has not check back in with her PCP.  Denies fevers, chest pain, shortness of breath, heart racing, bleeding, bruising.  She would like a complete panel of labs.  Reports a history of anemia.  Denies pica, feeling persistently cold, easy bruising, easily bleeding.  No current facility-administered medications for this encounter.  Current Outpatient Medications:  .  Prenatal Vit-Fe Fumarate-FA (PRENATAL MULTIVITAMIN) TABS tablet, Take 1 tablet by mouth daily at 12 noon., Disp: , Rfl:    Allergies  Allergen Reactions  . Ibuprofen Hives    Past Medical History:  Diagnosis Date  . Anemia    takes iron  . Breast cancer ([place])   . Fibroids    uterine     Past Surgical History:  Procedure Laterality Date  . BREAST IMPLANT REMOVAL Right 04/25/2014   Procedure: REMOVAL BREAST IMPLANT WITH CAPSULECTOMY;  Surgeon: Theodoro Kos, DO;  Location: [facility];  Service: Plastics;  Laterality: Right;  . BREAST RECONSTRUCTION    . BREAST REDUCTION SURGERY    . CESAREAN SECTION     x3  . ESOPHAGOGASTRODUODENOSCOPY N/A 01/02/2016   Procedure: ESOPHAGOGASTRODUODENOSCOPY (EGD);  Surgeon: Clarene Essex, MD;  Location: [facility] ENDOSCOPY;  Service: Endoscopy;  Laterality: N/A;  . GASTRIC BYPASS    . MASTECTOMY     right  . SIMPLE MASTECTOMY WITH AXILLARY SENTINEL NODE BIOPSY Left 04/25/2014   Procedure: LEFT TOTAL  MASTECTOMY (PROPHYLACTIC);  Surgeon: Rolm Bookbinder, MD;  Location: [place];  Service: General;  Laterality: Left;  . TUBAL  LIGATION      Family History  Problem Relation Age of Onset  . Healthy Mother     Social History   Tobacco Use  . Smoking status: Never Smoker  . Smokeless tobacco: Never Used  Substance Use Topics  . Alcohol use: Yes    Comment: social  . Drug use: No    ROS   Objective:   Vitals: BP (!) 133/94   Pulse 79   Temp 98 F (36.7 C) (Oral)   Resp 16   SpO2 96%   Physical Exam Constitutional:      General: She is not in acute distress.    Appearance: Normal appearance. She is well-developed. She is not ill-appearing, toxic-appearing or diaphoretic.  HENT:     Head: Normocephalic and atraumatic.     Nose: Nose normal.     Mouth/Throat:     Mouth: Mucous membranes are moist.     Pharynx: Oropharynx is clear. No oropharyngeal exudate or posterior oropharyngeal erythema.  Eyes:     General: No scleral icterus.       Right eye: No discharge.        Left eye: No discharge.     Extraocular Movements: Extraocular movements intact.     Pupils: Pupils are equal, round, and reactive to light.  Cardiovascular:     Rate and Rhythm: Normal rate and regular rhythm.     Pulses: Normal pulses.     Heart sounds: Normal heart sounds. No murmur. No friction rub.  No gallop.   Pulmonary:     Effort: Pulmonary effort is normal. No respiratory distress.     Breath sounds: Normal breath sounds. No stridor. No wheezing, rhonchi or rales.  Musculoskeletal:     Comments: Brisk capillary refill.  Skin:    General: Skin is warm and dry.     Findings: No bruising or rash.  Neurological:     Mental Status: She is alert and oriented to person, place, and time.     Cranial Nerves: No cranial nerve deficit.  Psychiatric:        Mood and Affect: Mood normal.        Behavior: Behavior normal.        Thought Content: Thought content normal.        Judgment: Judgment normal.      Assessment and Plan :   1. Fatigue, unspecified type   2. Insomnia, unspecified type   3. Hot flashes   4.  Anxiety   5. History of right breast cancer   6. History of mastectomy, unspecified laterality     Physical exam findings and vital signs reassuring.  Counseled patient on last suspect is early menopause.  Recommended patient establish care with a new PCP for complete annual physical with labs, information provided to the patient.  Use hydroxyzine for anxiety and insomnia. Counseled patient on potential for adverse effects with medications prescribed/recommended today, ER and return-to-clinic precautions discussed, patient verbalized understanding.    Jaynee Eagles, PA-C 10/31/19 0930

## 2020-03-17 ENCOUNTER — Encounter (HOSPITAL_COMMUNITY): Payer: Self-pay | Admitting: Emergency Medicine

## 2020-03-17 ENCOUNTER — Emergency Department (HOSPITAL_COMMUNITY): Payer: No Typology Code available for payment source

## 2020-03-17 ENCOUNTER — Emergency Department (HOSPITAL_COMMUNITY)
Admission: EM | Admit: 2020-03-17 | Discharge: 2020-03-17 | Disposition: A | Discharge status: Home / Self Care | Payer: No Typology Code available for payment source | Attending: Emergency Medicine | Admitting: Emergency Medicine

## 2020-03-17 ENCOUNTER — Other Ambulatory Visit: Payer: Self-pay

## 2020-03-17 DIAGNOSIS — Z853 Personal history of malignant neoplasm of breast: Secondary | ICD-10-CM | POA: Diagnosis not present

## 2020-03-17 DIAGNOSIS — Y92410 Unspecified street and highway as the place of occurrence of the external cause: Secondary | ICD-10-CM | POA: Diagnosis not present

## 2020-03-17 DIAGNOSIS — Y929 Unspecified place or not applicable: Secondary | ICD-10-CM | POA: Insufficient documentation

## 2020-03-17 DIAGNOSIS — M791 Myalgia, unspecified site: Secondary | ICD-10-CM | POA: Diagnosis not present

## 2020-03-17 DIAGNOSIS — R519 Headache, unspecified: Secondary | ICD-10-CM | POA: Diagnosis not present

## 2020-03-17 DIAGNOSIS — Y939 Activity, unspecified: Secondary | ICD-10-CM | POA: Insufficient documentation

## 2020-03-17 DIAGNOSIS — S161XXA Strain of muscle, fascia and tendon at neck level, initial encounter: Secondary | ICD-10-CM | POA: Diagnosis not present

## 2020-03-17 DIAGNOSIS — Y999 Unspecified external cause status: Secondary | ICD-10-CM | POA: Insufficient documentation

## 2020-03-17 DIAGNOSIS — S199XXA Unspecified injury of neck, initial encounter: Secondary | ICD-10-CM | POA: Diagnosis present

## 2020-03-17 MED ORDER — METHOCARBAMOL 500 MG PO TABS: 500.0000 mg | ORAL_TABLET | Freq: Two times a day (BID) | ORAL | 0 refills | Status: DC

## 2020-03-17 NOTE — ED Notes (Signed)
Verbalized understanding of DC instructions, Rx, follow up care. Ambulatory 
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
## 2020-04-15 ENCOUNTER — Encounter: Payer: Self-pay | Admitting: *Deleted

## 2020-04-23 ENCOUNTER — Encounter: Payer: Self-pay | Admitting: Obstetrics and Gynecology

## 2020-05-19 ENCOUNTER — Encounter: Payer: Self-pay | Admitting: Obstetrics & Gynecology

## 2020-05-19 ENCOUNTER — Ambulatory Visit (INDEPENDENT_AMBULATORY_CARE_PROVIDER_SITE_OTHER): Payer: Self-pay | Admitting: Obstetrics & Gynecology

## 2020-05-19 ENCOUNTER — Other Ambulatory Visit: Payer: Self-pay

## 2020-05-19 VITALS — BP 141/88 | HR 74 | Wt 226.0 lb

## 2020-05-19 DIAGNOSIS — Z9013 Acquired absence of bilateral breasts and nipples: Secondary | ICD-10-CM

## 2020-05-19 DIAGNOSIS — N938 Other specified abnormal uterine and vaginal bleeding: Secondary | ICD-10-CM

## 2020-05-19 DIAGNOSIS — D219 Benign neoplasm of connective and other soft tissue, unspecified: Secondary | ICD-10-CM

## 2020-05-19 MED ORDER — TRANEXAMIC ACID 650 MG PO TABS: 1300.0000 mg | ORAL_TABLET | Freq: Three times a day (TID) | ORAL | 2 refills | Status: DC

## 2020-05-19 NOTE — Patient Instructions (Signed)
Uterine Fibroids  Uterine fibroids are lumps of tissue (tumors) in your womb (uterus). They are not cancer (are benign). Most women with this condition do not need treatment. Sometimes fibroids can affect your ability to have children (your fertility). If that happens, you may need surgery to take out the fibroids. Follow these instructions at home:  Take over-the-counter and prescription medicines only as told by your doctor. Your doctor may suggest NSAIDs (such as aspirin or ibuprofen) to help with pain.  Ask your doctor if you should: ? Take iron pills. ? Eat more foods that have iron in them, such as dark green, leafy vegetables.  If directed, apply heat to your back or belly to reduce pain. Use the heat source that your doctor recommends, such as a moist heat pack or a heating pad. ? Put a towel between your skin and the heat source. ? Leave the heat on for 20-30 minutes. ? Remove the heat if your skin turns bright red. This is especially important if you are unable to feel pain, heat, or cold. You may have a greater risk of getting burned.  Pay close attention to your period (menstrual) cycles. Tell your doctor about any changes, such as: ? A heavier blood flow than usual. ? Needing to use more pads or tampons than normal. ? A change in how many days your period lasts. ? A change in symptoms that come with your period, such as cramps or back pain.  Keep all follow-up visits as told by your doctor. This is important. Your doctor may need to watch your fibroids over time for any changes. Contact a doctor if you:  Have pain that does not get better with medicine or heat, such as pain or cramps in: ? Your back. ? The area between your hip bones (pelvic area). ? Your belly.  Have new bleeding between your periods.  Have more bleeding during or between your periods.  Feel very tired or weak.  Feel light-headed. Get help right away if you:  Pass out (faint).  Have pain in the  area between your hip bones that suddenly gets worse.  Have bleeding that soaks a tampon or pad in 30 minutes or less. Summary  Uterine fibroids are lumps of tissue (tumors) in your womb (uterus). They are not cancer.  The only treatment that most women need is taking aspirin or ibuprofen for pain.  Contact a doctor if you have pain or cramps that do not get better with medicine.  Make sure you know what symptoms you should get help for right away. This information is not intended to replace advice given to you by your health care provider. Make sure you discuss any questions you have with your health care provider. Document Revised: 07/15/2017 Document Reviewed: 06/28/2017 Elsevier Patient Education  2020 Elsevier Inc.  

## 2020-05-19 NOTE — Progress Notes (Addendum)
History:  [name] is a 44 y.o. [phone] who presents to clinic today for uterine fibroids and menorrhagia. She states she had imaging done in 2015 that found her fibroids but has not had follow-up imaging due to lack of insurance. She describes her period occurring irregularly, every 2-3 months, and lasting 3-6 weeks at a time. Her bleeding is heavy and she goes through 4 super tampons and 4 heavy pads per day on average. She endorses moderate-to-severe pelvic cramping during menstruation that limits her daily activities. Alleviating factors include applying heat to lower abdomen, patient denies aggravating factors. LMP was sometime in July. She had a BTL performed after her last birth 103 years ago and desires a hysterectomy. PMH pertinent for breast cancer in 2004 that was treated with a double mastectomy and chemotherapy.  The following portions of the patient's history were reviewed and updated as appropriate: allergies, current medications, family history, past medical history, social history, past surgical history and problem list.  Review of Systems:  Review of Systems  Constitutional: Negative.   Respiratory: Negative for cough, shortness of breath and wheezing.   Cardiovascular: Negative for chest pain.  Gastrointestinal: Negative.   Genitourinary: Negative.   All other systems reviewed and are negative.     Objective:  Physical Exam BP (!) 141/88   Pulse 74   Wt 226 lb (102.5 kg)   BMI 37.61 kg/m  Physical Exam Vitals and nursing note reviewed. Exam conducted with a chaperone present.  Constitutional:      General: She is not in acute distress.    Appearance: Normal appearance. She is obese. She is not ill-appearing.  Cardiovascular:     Rate and Rhythm: Normal rate and regular rhythm.     Pulses: Normal pulses.     Heart sounds: Normal heart sounds.  Pulmonary:     Effort: Pulmonary effort is normal.     Breath sounds: Normal breath sounds. No wheezing.   Abdominal:     General: Bowel sounds are normal.     Palpations: Abdomen is soft. There is no mass.     Tenderness: There is no abdominal tenderness. There is no guarding.  Genitourinary:    General: Normal vulva.     Labia:        Right: No rash or lesion.        Left: No rash or lesion.      Vagina: Normal. No vaginal discharge or tenderness.     Cervix: Normal.     Uterus: Enlarged and tender (mildy tender).      Adnexa: Right adnexa normal and left adnexa normal.       Right: No tenderness.         Left: No tenderness.    Skin:    General: Skin is warm and dry.     Capillary Refill: Capillary refill takes less than 2 seconds.  Neurological:     General: No focal deficit present.     Mental Status: She is alert and oriented to person, place, and time.  Psychiatric:        Mood and Affect: Mood normal.        Behavior: Behavior normal.       Labs and Imaging No results found for this or any previous visit (from the past 24 hour(s)).  No results found.   Assessment & Plan:  1. Uterine Fibroids - CT of abdomen and pelvis showed uterine fibroid in 2015, no follow-up imaging was completed. Uterine  enlargement on physical exam. - Complete pelvic ultrasound scheduled to determine progression of fibroid.  - Prescribed tranexamic acid, 1,300 mg TID x5 days at the start of menses.  - Follow up in 4 weeks to discuss results of ultrasound and plan moving forward. Patient desires a hysterectomy but understands the need for current imaging prior to making a plan.  2. History of Breast Cancer - bilateral mastectomy performed + chemotherapy in 2004 - No concerns currently   Approximately 15 minutes of total time was spent with this patient on 05/19/20.  Drusilla Kanner 05/19/2020 4:25 PM   Woodroe Mode, MD Attestation of Attending Supervision of PA Student: Evaluation and management procedures were performed by the student under my supervision and collaboration.   I have reviewed the student's note and chart, and I agree with the management and plan.  Emeterio Reeve, MD, [place] Attending [place], [facility]

## 2020-05-29 ENCOUNTER — Ambulatory Visit: Payer: No Typology Code available for payment source

## 2020-06-11 ENCOUNTER — Ambulatory Visit: Admission: RE | Admit: 2020-06-11 | Payer: No Typology Code available for payment source | Source: Ambulatory Visit

## 2020-06-12 ENCOUNTER — Other Ambulatory Visit: Payer: Self-pay

## 2020-06-12 ENCOUNTER — Ambulatory Visit
Admission: RE | Admit: 2020-06-12 | Discharge: 2020-06-12 | Disposition: A | Discharge status: Home / Self Care | Payer: No Typology Code available for payment source | Source: Ambulatory Visit | Attending: Obstetrics & Gynecology | Admitting: Obstetrics & Gynecology

## 2020-06-12 DIAGNOSIS — D219 Benign neoplasm of connective and other soft tissue, unspecified: Secondary | ICD-10-CM | POA: Insufficient documentation

## 2020-06-13 ENCOUNTER — Telehealth: Payer: Self-pay | Admitting: Lactation Services

## 2020-06-13 NOTE — Telephone Encounter (Signed)
Called patient to inform her of [place] results and to keep appt on 11/4 to discuss. Patient did not answer. LM for patient to call the office for results and reminder her of appt on 11/4 at 2:35 for follow up.

## 2020-06-13 NOTE — Telephone Encounter (Signed)
-----   Message from Woodroe Mode, MD sent at 06/12/2020  3:47 PM EDT ----- Fibroid uterus, keep appointment as scheduled

## 2020-06-17 ENCOUNTER — Telehealth: Payer: Self-pay | Admitting: Lactation Services

## 2020-06-17 NOTE — Telephone Encounter (Signed)
Patient called and LM that she was returning call to get her [place] results. She would like a call back.

## 2020-06-17 NOTE — Telephone Encounter (Signed)
Called patient & informed her of results and reminded her of appt on Thursday. Patient verbalized understanding.

## 2020-06-19 ENCOUNTER — Other Ambulatory Visit: Payer: Self-pay

## 2020-06-19 ENCOUNTER — Encounter: Payer: Self-pay | Admitting: Obstetrics & Gynecology

## 2020-06-19 ENCOUNTER — Ambulatory Visit (INDEPENDENT_AMBULATORY_CARE_PROVIDER_SITE_OTHER): Payer: PRIVATE HEALTH INSURANCE | Admitting: Obstetrics & Gynecology

## 2020-06-19 DIAGNOSIS — D259 Leiomyoma of uterus, unspecified: Secondary | ICD-10-CM | POA: Diagnosis not present

## 2020-06-19 DIAGNOSIS — N938 Other specified abnormal uterine and vaginal bleeding: Secondary | ICD-10-CM | POA: Diagnosis not present

## 2020-06-19 NOTE — Patient Instructions (Signed)
Hysterectomy Information  A hysterectomy is a surgery to remove your uterus. After surgery, you will no longer have periods. Also, you will no longer be able to get pregnant. Reasons for this surgery You may have this surgery if:  You have bleeding in your vagina: ? That is not normal. ? That does not stop, or that keeps coming back.  You have long-term (chronic) pain in your lower belly (pelvic area).  The lining of your uterus grows outside of the uterus (endometriosis).  The lining of your uterus grows in the muscle of the uterus (adenomyosis).  Your uterus falls down into your vagina (prolapse).  You have a growth in your uterus that causes problems (uterine fibroids).  You have cells that could turn into cancer (precancerous cells).  You have cancer of the uterus or cervix. Types of hysterectomies There are 3 types of hysterectomies. Depending on the type, the surgery will:  Remove the top part of the uterus (supracervical).  Remove the uterus and the cervix (total).  Remove the uterus, cervix, and tissue that holds the uterus in place (radical). Ways a hysterectomy can be done This surgery may be done in one of these ways:  A cut (incision) is made in the belly (abdomen). The uterus is taken out through the cut.  A cut is made in the vagina. The uterus is taken out through the cut.  Three or four cuts are made in the belly. A device with a camera is put through one of the cuts. The uterus is cut into pieces and taken out through the cuts or the vagina.  Three or four cuts are made in the belly. A device with a camera is put through one of the cuts. The uterus is taken out through the vagina.  Three or four cuts are made in the belly. A computer helps control the surgical tools. The uterus is cut into small pieces. The pieces are taken out through the cuts or through the vagina. Talk with your doctor about which way is best for you. Risks of hysterectomy Generally,  this surgery is safe. However, problems can happen, including:  Bleeding.  Needing donated blood (transfusion).  Blood clots.  Infection.  Damage to other structures or organs.  Allergic reactions.  Needing to switch to a different type of surgery. What to expect after surgery  You will be given pain medicine.  You will need to stay in the hospital for 1-2 days.  Follow your doctor's instructions about: ? Exercising. ? Driving. ? What activities are safe for you.  You will need to have someone with you at home for 3-5 days.  You will need to see your doctor after 2-4 weeks.  You may get hot flashes, have night sweats, and have trouble sleeping.  You may need to have Pap tests if your surgery was related to cancer. Talk with your doctor about how often you need Pap tests. Questions to ask your doctor  Do I need this surgery? Do I have other treatment options?  What are my options for this surgery?  What needs to be removed?  What are the risks?  What are the benefits?  How long will I need to stay in the hospital?  How long will I need to recover?  What symptoms can I expect after the procedure? Summary  A hysterectomy is a surgery to remove your uterus. After surgery, you will no longer have periods. Also, you will no longer be able to   get pregnant.  Talk with your doctor about which type of hysterectomy is best for you. This information is not intended to replace advice given to you by your health care provider. Make sure you discuss any questions you have with your health care provider. Document Revised: 10/05/2018 Document Reviewed: 11/02/2016 Elsevier Patient Education  2020 Elsevier Inc.  

## 2020-06-19 NOTE — Progress Notes (Signed)
Patient ID: Cheryl Holloway, female   DOB: 10/17/1975, 44 y.o.   MRN: [id number]  Chief Complaint  Patient presents with  . Results  s/p [place]  HPI Cheryl Holloway is a 44 y.o. female.  [id number] Patient's last menstrual period was 04/07/2020 (approximate). who presents to clinic today for uterine fibroids and menorrhagia. She states she had imaging done in 2015 that found her fibroids but has not had follow-up imaging due to lack of insurance. She describes her period occurring irregularly, every 2-3 months, and lasting 3-6 weeks at a time. Her bleeding is heavy and she goes through 4 super tampons and 4 heavy pads per day on average. She endorses moderate-to-severe pelvic cramping during menstruation that limits her daily activities. Alleviating factors include applying heat to lower abdomen, patient denies aggravating factors. She had a BTL performed after her last birth 4 years ago and desires a hysterectomy. [place] form last week was reviewed including viewing images today.  HPI  Past Medical History:  Diagnosis Date  . Anemia    takes iron  . Breast cancer ([place])   . Fibroids    uterine    Past Surgical History:  Procedure Laterality Date  . BREAST IMPLANT REMOVAL Right 04/25/2014   Procedure: REMOVAL BREAST IMPLANT WITH CAPSULECTOMY;  Surgeon: Theodoro Kos, DO;  Location: [facility];  Service: Plastics;  Laterality: Right;  . BREAST RECONSTRUCTION    . BREAST REDUCTION SURGERY    . CESAREAN SECTION     x3  . ESOPHAGOGASTRODUODENOSCOPY N/A 01/02/2016   Procedure: ESOPHAGOGASTRODUODENOSCOPY (EGD);  Surgeon: Clarene Essex, MD;  Location: [facility] ENDOSCOPY;  Service: Endoscopy;  Laterality: N/A;  . GASTRIC BYPASS    . MASTECTOMY     right  . SIMPLE MASTECTOMY WITH AXILLARY SENTINEL NODE BIOPSY Left 04/25/2014   Procedure: LEFT TOTAL  MASTECTOMY (PROPHYLACTIC);  Surgeon: Rolm Bookbinder, MD;  Location: [facility];  Service: General;  Laterality: Left;  . TUBAL LIGATION       Family History  Problem Relation Age of Onset  . Healthy Mother     Social History Social History   Tobacco Use  . Smoking status: Never Smoker  . Smokeless tobacco: Never Used  Vaping Use  . Vaping Use: Never used  Substance Use Topics  . Alcohol use: Yes    Comment: social  . Drug use: No    Allergies  Allergen Reactions  . Ibuprofen Hives    Current Outpatient Medications  Medication Sig Dispense Refill  . Prenatal Vit-Fe Fumarate-FA (PRENATAL MULTIVITAMIN) TABS tablet Take 1 tablet by mouth daily at 12 noon.    . hydrOXYzine (ATARAX/VISTARIL) 25 MG tablet Take 0.5-1 tablets (12.5-25 mg total) by mouth every 8 (eight) hours as needed for anxiety. (Patient not taking: Reported on 03/17/2020) 60 tablet 0  . methocarbamol (ROBAXIN) 500 MG tablet Take 1 tablet (500 mg total) by mouth 2 (two) times daily. (Patient not taking: Reported on 05/19/2020) 20 tablet 0  . tranexamic acid (LYSTEDA) 650 MG TABS tablet Take 2 tablets (1,300 mg total) by mouth 3 (three) times daily. Take during menses for a maximum of five days (Patient not taking: Reported on 06/19/2020) 30 tablet 2   No current facility-administered medications for this visit.    Review of Systems Review of Systems  Constitutional: Negative.   HENT: Negative.   Respiratory: Negative.   Cardiovascular: Negative.   Gastrointestinal: Negative.   Genitourinary: Positive for menstrual problem and pelvic pain. Negative for dysuria, vaginal bleeding  and vaginal discharge.    Blood pressure (!) 154/76, pulse 71, weight 219 lb 6.4 oz (99.5 kg), last menstrual period 04/07/2020.  Physical Exam Physical Exam Constitutional:      Appearance: She is obese. She is not ill-appearing.  Cardiovascular:     Rate and Rhythm: Normal rate.  Pulmonary:     Effort: Pulmonary effort is normal.  Neurological:     Mental Status: She is alert.  Psychiatric:        Mood and Affect: Mood normal.        Behavior: Behavior normal.      Data Reviewed Narrative & Impression  CLINICAL DATA:  Dysfunctional uterine bleeding, uterine fibroid, history breast cancer; LMP August 2021 specific date not provided; history tubal ligation, Caesarean section  EXAM: TRANSABDOMINAL AND TRANSVAGINAL ULTRASOUND OF PELVIS  TECHNIQUE: Both transabdominal and transvaginal ultrasound examinations of the pelvis were performed. Transabdominal technique was performed for global imaging of the pelvis including uterus, ovaries, adnexal regions, and pelvic cul-de-sac. It was necessary to proceed with endovaginal exam following the transabdominal exam to visualize the endometrium and ovaries.  COMPARISON:  None  FINDINGS: Uterus  Measurements: 11.3 x 5.4 x 7.6 cm = volume: 242 mL. Anteverted. Lobulated appearance of the mid upper uterus due to presence of multiple uterine leiomyomata. These include a transmural fundal leiomyoma 6.7 cm greatest diameter, posterior wall leiomyoma to LEFT 3.6 cm greatest diameter, and a small submucosal upper uterine anterior leiomyoma 2.2 cm diameter.  Endometrium  Thickness: 5 mm. Difficult to visualize due to distortion from uterine leiomyomata. No obvious endometrial fluid.  Right ovary  Measurements: 2.3 x 1.9 x 2.4 cm = volume: 5.5 mL. Normal morphology without mass  Left ovary  Measurements: 2.5 x 1.7 x 1.6 cm = volume: 3.3 mL. Normal morphology without mass  Other findingsTrace free pelvic fluid.  No adnexal masses.  IMPRESSION: Mildly enlarged uterus containing at least 3 uterine leiomyomata, largest 6.7 mm diameter.   Electronically Signed   By: Lavonia Dana M.D.   On: 06/12/2020 09:09      Assessment DUB, menorrhagia with uterine fibroids She is a candidate for [facility] or LAVH  Plan We need to have up to date pap and an endometrial biopsy, and she will prepare by taking ibuprofen prior to procedure RTC for pap and biopsy Surgery not scheduled today, she  will call her insurance provider    Emeterio Reeve 06/19/2020, 3:16 PM

## 2020-07-07 ENCOUNTER — Other Ambulatory Visit: Payer: Self-pay

## 2020-07-07 ENCOUNTER — Ambulatory Visit (INDEPENDENT_AMBULATORY_CARE_PROVIDER_SITE_OTHER): Payer: PRIVATE HEALTH INSURANCE | Admitting: Obstetrics & Gynecology

## 2020-07-07 ENCOUNTER — Other Ambulatory Visit (HOSPITAL_COMMUNITY)
Admission: RE | Admit: 2020-07-07 | Discharge: 2020-07-07 | Disposition: A | Discharge status: Home / Self Care | Payer: PRIVATE HEALTH INSURANCE | Source: Ambulatory Visit | Attending: Obstetrics & Gynecology | Admitting: Obstetrics & Gynecology

## 2020-07-07 ENCOUNTER — Encounter: Payer: Self-pay | Admitting: Obstetrics & Gynecology

## 2020-07-07 VITALS — BP 123/83 | HR 83 | Temp 98.6°F | Wt 218.0 lb

## 2020-07-07 DIAGNOSIS — Z124 Encounter for screening for malignant neoplasm of cervix: Secondary | ICD-10-CM

## 2020-07-07 DIAGNOSIS — N938 Other specified abnormal uterine and vaginal bleeding: Secondary | ICD-10-CM

## 2020-07-07 DIAGNOSIS — D259 Leiomyoma of uterus, unspecified: Secondary | ICD-10-CM

## 2020-07-07 DIAGNOSIS — Z3202 Encounter for pregnancy test, result negative: Secondary | ICD-10-CM | POA: Diagnosis not present

## 2020-07-07 LAB — POCT PREGNANCY, URINE: Preg Test, Ur: NEGATIVE

## 2020-07-07 NOTE — Patient Instructions (Signed)
Hysterectomy Information  A hysterectomy is a surgery in which the uterus is removed. The fallopian tubes and ovaries may be removed (bilateral salpingo-oophorectomy) as well. This procedure may be done to treat various medical problems. After the procedure, a woman will no longer have menstrual periods nor will she be able to become pregnant (sterile). What are the reasons for a hysterectomy? There are many reasons why a woman might have this procedure. They include:  Persistent, abnormal vaginal bleeding.  Long-term (chronic) pelvic pain or infection.  Endometriosis. This is when the lining of the uterus (endometrium) starts to grow outside the uterus.  Adenomyosis. This is when the endometrium starts to grow in the muscle of the uterus.  Pelvic organ prolapse. This is a condition in which the uterus falls down into the vagina.  Noncancerous growths in the uterus (uterine fibroids) that cause symptoms.  The presence of precancerous cells.  Cervical or uterine cancer. What are the different types of hysterectomy? There are three different types of hysterectomy:  Supracervical hysterectomy. In this type, the top part of the uterus is removed, but not the cervix.  Total hysterectomy. In this type, the uterus and cervix are removed.  Radical hysterectomy. In this type, the uterus, the cervix, and the tissue that holds the uterus in place (parametrium) are removed. What are the different ways a hysterectomy can be performed? There are many different ways a hysterectomy can be performed, including:  Abdominal hysterectomy. In this type, an incision is made in the abdomen. The uterus is removed through this incision.  Vaginal hysterectomy. In this type, an incision is made in the vagina. The uterus is removed through this incision. There are no abdominal incisions.  Conventional laparoscopic hysterectomy. In this type, three or four small incisions are made in the abdomen. A thin,  lighted tube with a camera (laparoscope) is inserted into one of the incisions. Other tools are put through the other incisions. The uterus is cut into small pieces. The small pieces are removed through the incisions or through the vagina.  Laparoscopically assisted vaginal hysterectomy (LAVH). In this type, three or four small incisions are made in the abdomen. Part of the surgery is performed laparoscopically and the other part is done vaginally. The uterus is removed through the vagina.  Robot-assisted laparoscopic hysterectomy. In this type, a laparoscope and other tools are inserted into three or four small incisions in the abdomen. A computer-controlled device is used to give the surgeon a 3D image and to help control the surgical instruments. This allows for more precise movements of surgical instruments. The uterus is cut into small pieces and removed through the incisions or removed through the vagina. Discuss the options with your health care provider to determine which type is the right one for you. What are the risks? Generally, this is a safe procedure. However, problems may occur, including:  Bleeding and risk of blood transfusion. Tell your health care provider if you do not want to receive any blood products.  Blood clots in the legs or lung.  Infection.  Damage to other structures or organs.  Allergic reactions to medicines.  Changing to an abdominal hysterectomy from one of the other techniques. What to expect after a hysterectomy  You will be given pain medicine.  You may need to stay in the hospital for 1- 2 days to recover, depending on the type of hysterectomy you had.  Follow your health care provider's instructions about exercise, driving, and general activities. Ask your   health care provider what activities are safe for you.  You will need to have someone with you for the first 3-5 days after you go home.  You will need to follow up with your surgeon in 2-4  weeks after surgery to evaluate your progress.  If the ovaries are removed, you will have early menopause symptoms such as hot flashes, night sweats, and insomnia.  If you had a hysterectomy for a problem that was not cancer or not a condition that could lead to cancer, then you no longer need Pap tests. However, even if you no longer need a Pap test, a regular pelvic exam is a good idea to make sure no other problems are developing. Questions to ask your health care provider  Is a hysterectomy medically necessary? Do I have other treatment options for my condition?  What are my options for hysterectomy procedure?  What organs and tissues need to be removed?  What are the risks?  What are the benefits?  How long will I need to stay in the hospital after the procedure?  How long will I need to recover at home?  What symptoms can I expect after the procedure? Summary  A hysterectomy is a surgery in which the uterus is removed. The fallopian tubes and ovaries may be removed (bilateral salpingo-oophorectomy) as well.  This procedure may be done to treat various medical problems. After the procedure, a woman will no longer have menstrual periods nor will she be able to become pregnant.  Discuss the options with your health care provider to determine which type of hysterectomy is the right one for you. This information is not intended to replace advice given to you by your health care provider. Make sure you discuss any questions you have with your health care provider. Document Revised: 07/15/2017 Document Reviewed: 09/08/2016 Elsevier Patient Education  2020 [employer].

## 2020-07-07 NOTE — Addendum Note (Signed)
Addended by: Annabell Howells on: 07/07/2020 05:04 PM   Modules accepted: Orders

## 2020-07-07 NOTE — Progress Notes (Signed)
SL:HTDSK assessment, DUB  44 y.o. [id number] No LMP recorded. (Menstrual status: Irregular Periods).   Patient given informed consent, signed copy in the chart, time out was performed. Appropriate time out taken. . The patient was placed in the lithotomy position and the cervix brought into view with sterile speculum. Pap test was obtained. Portio of cervix cleansed x 2 with betadine swabs.  A tenaculum was placed in the anterior lip of the cervix.  The uterus was sounded for depth of 9 cm. A pipelle was introduced to into the uterus, suction created,  and an endometrial sample was obtained. All equipment was removed and accounted for.  The patient tolerated the procedure well.    Patient given post procedure instructions.   Patient desires surgical management with LAVH and BS.  She has a h/o CS. The risks of surgery were discussed in detail with the patient including but not limited to: bleeding which may require transfusion or reoperation; infection which may require prolonged hospitalization or re-hospitalization and antibiotic therapy; injury to bowel, bladder, ureters and major vessels or other surrounding organs; formation of adhesions; need for additional procedures including laparotomy or subsequent procedures secondary to abnormal pathology; thromboembolic phenomenon; incisional problems and other postoperative or anesthesia complications.  Patient was told that the likelihood that her condition and symptoms will be treated effectively with this surgical management was very high; the postoperative expectations were also discussed in detail. The patient also understands the alternative treatment options which were discussed in full. All questions were answered.  She was told that she will be contacted by our surgical scheduler regarding the time and date of her surgery; routine preoperative instructions will be given to her by the preoperative nursing team.   She is aware of need for preoperative  COVID testing and subsequent quarantine from time of test to time of surgery; she will be given further preoperative instructions at that [place] screening visit.  Printed patient education handouts about the procedure were given to the patient to review at home.  Woodroe Mode, MD 07/07/2020

## 2020-07-09 ENCOUNTER — Telehealth: Payer: Self-pay | Admitting: Lactation Services

## 2020-07-09 LAB — SURGICAL PATHOLOGY

## 2020-07-09 NOTE — Telephone Encounter (Signed)
-----   Message from Woodroe Mode, MD sent at 07/09/2020  3:26 PM EST ----- Benign tissue biopsy

## 2020-07-09 NOTE — Telephone Encounter (Signed)
Called patient to inform her that the Endometrial Biopsy results are normal.   Patient asking if her surgery has been scheduled, informed her she will be called by the surgery scheduler to be scheduled. Patient voiced understanding.

## 2020-07-11 LAB — CYTOLOGY - PAP
Adequacy: ABSENT
Comment: NEGATIVE
Diagnosis: NEGATIVE
High risk HPV: NEGATIVE

## 2020-07-21 ENCOUNTER — Telehealth: Payer: Self-pay

## 2020-07-21 NOTE — Telephone Encounter (Signed)
Called Pt to see how soon she could come in to sign Hysterectomy Statement & she said that she will come in the AM,  Advised that form will be at [employer]. Pt verbalized understanding.

## 2020-07-22 ENCOUNTER — Encounter: Payer: Self-pay | Admitting: *Deleted

## 2020-07-22 NOTE — Progress Notes (Signed)
Patient came to office and signed Hysterectomy statement Chet Greenley,RN

## 2020-07-23 ENCOUNTER — Encounter: Payer: Self-pay | Admitting: *Deleted

## 2020-08-22 ENCOUNTER — Inpatient Hospital Stay (HOSPITAL_COMMUNITY): Admission: RE | Admit: 2020-08-22 | Payer: PRIVATE HEALTH INSURANCE | Source: Ambulatory Visit

## 2020-08-22 ENCOUNTER — Other Ambulatory Visit (HOSPITAL_COMMUNITY): Payer: PRIVATE HEALTH INSURANCE

## 2020-09-12 ENCOUNTER — Other Ambulatory Visit: Payer: Self-pay | Admitting: Obstetrics & Gynecology

## 2020-10-01 NOTE — Progress Notes (Signed)
Surgical Instructions    Your procedure is scheduled on 10/07/20.  Report to [facility] Main Entrance "A" at 08:00 A.M., then check in with the Admitting office.  Call this number if you have problems the morning of surgery:  [phone]   If you have any questions prior to your surgery date call [phone]: Open Monday-Friday 8am-4pm    Remember:  Do not eat after midnight the night before your surgery  You may drink clear liquids until 07:00am the morning of your surgery.   Clear liquids allowed are: Water, Non-Citrus Juices (without pulp), Carbonated Beverages, Clear Tea, Black Coffee Only, and Gatorade  Patient Instructions  . The night before surgery:  o No food after midnight. ONLY clear liquids after midnight  . The day of surgery (if you do NOT have diabetes):  o Drink ONE (1) Pre-Surgery Clear Ensure by 07:00am the morning of surgery. Drink in one sitting. Do not sip.  o This drink was given to you during your hospital  pre-op appointment visit. o Nothing else to drink after completing the  Pre-Surgery Clear Ensure.          If you have questions, please contact your surgeon's office.     Take these medicines the morning of surgery with A SIP OF WATER: NONE   As of today, STOP taking any Aspirin (unless otherwise instructed by your surgeon) Aleve, Naproxen, Ibuprofen, Motrin, Advil, Goody's, BC's, all herbal medications, fish oil, and all vitamins.                     Do not wear jewelry, make up, or nail polish            Do not wear lotions, powders, perfumes/colognes, or deodorant.            Do not shave 48 hours prior to surgery.              Do not bring valuables to the hospital.            [facility] is not responsible for any belongings or valuables.  Do NOT Smoke (Tobacco/Vaping) or drink Alcohol 24 hours prior to your procedure If you use a CPAP at night, you may bring all equipment for your overnight stay.   Contacts, glasses, dentures or  bridgework may not be worn into surgery, please bring cases for these belongings   For patients admitted to the hospital, discharge time will be determined by your treatment team.   Patients discharged the day of surgery will not be allowed to drive home, and someone needs to stay with them for 24 hours.    Special instructions:   [facility]- Preparing For Surgery  Before surgery, you can play an important role. Because skin is not sterile, your skin needs to be as free of germs as possible. You can reduce the number of germs on your skin by washing with CHG (chlorahexidine gluconate) Soap before surgery.  CHG is an antiseptic cleaner which kills germs and bonds with the skin to continue killing germs even after washing.    Oral Hygiene is also important to reduce your risk of infection.  Remember - BRUSH YOUR TEETH THE MORNING OF SURGERY WITH YOUR REGULAR TOOTHPASTE  Please do not use if you have an allergy to CHG or antibacterial soaps. If your skin becomes reddened/irritated stop using the CHG.  Do not shave (including legs and underarms) for at least 48 hours prior to first CHG shower.  It is OK to shave your face.  Please follow these instructions carefully.   1. Shower the NIGHT BEFORE SURGERY and the MORNING OF SURGERY  2. If you chose to wash your hair, wash your hair first as usual with your normal shampoo.  3. After you shampoo, rinse your hair and body thoroughly to remove the shampoo.  4. Wash Face and genitals (private parts) with your normal soap.   5.  Shower the NIGHT BEFORE SURGERY and the MORNING OF SURGERY with CHG Soap.   6. Use CHG Soap as you would any other liquid soap. You can apply CHG directly to the skin and wash gently with a scrungie or a clean washcloth.   7. Apply the CHG Soap to your body ONLY FROM THE NECK DOWN.  Do not use on open wounds or open sores. Avoid contact with your eyes, ears, mouth and genitals (private parts). Wash Face and genitals  (private parts)  with your normal soap.   8. Wash thoroughly, paying special attention to the area where your surgery will be performed.  9. Thoroughly rinse your body with warm water from the neck down.  10. DO NOT shower/wash with your normal soap after using and rinsing off the CHG Soap.  11. Pat yourself dry with a CLEAN TOWEL.  12. Wear CLEAN PAJAMAS to bed the night before surgery  13. Place CLEAN SHEETS on your bed the night before your surgery  14. DO NOT SLEEP WITH PETS.   Day of Surgery: Take a shower.  Wear Clean/Comfortable clothing the morning of surgery Do not apply any deodorants/lotions.   Remember to brush your teeth WITH YOUR REGULAR TOOTHPASTE.   Please read over the following fact sheets that you were given.

## 2020-10-02 ENCOUNTER — Encounter (HOSPITAL_COMMUNITY): Payer: Self-pay

## 2020-10-02 ENCOUNTER — Other Ambulatory Visit: Payer: Self-pay

## 2020-10-02 ENCOUNTER — Encounter (HOSPITAL_COMMUNITY)
Admission: RE | Admit: 2020-10-02 | Discharge: 2020-10-02 | Disposition: A | Discharge status: Home / Self Care | Payer: PRIVATE HEALTH INSURANCE | Source: Ambulatory Visit | Attending: Obstetrics & Gynecology | Admitting: Obstetrics & Gynecology

## 2020-10-02 DIAGNOSIS — Z01812 Encounter for preprocedural laboratory examination: Secondary | ICD-10-CM | POA: Diagnosis present

## 2020-10-02 LAB — CBC
HCT: 39.4 % (ref 36.0–46.0)
Hemoglobin: 12.2 g/dL (ref 12.0–15.0)
MCH: 26.4 pg (ref 26.0–34.0)
MCHC: 31 g/dL (ref 30.0–36.0)
MCV: 85.3 fL (ref 80.0–100.0)
Platelets: 178 10*3/uL (ref 150–400)
RBC: 4.62 MIL/uL (ref 3.87–5.11)
RDW: 16.9 % — ABNORMAL HIGH (ref 11.5–15.5)
WBC: 4.7 10*3/uL (ref 4.0–10.5)
nRBC: 0 % (ref 0.0–0.2)

## 2020-10-02 LAB — TYPE AND SCREEN
ABO/RH(D): A POS
Antibody Screen: NEGATIVE

## 2020-10-02 NOTE — Progress Notes (Signed)
PCP - [facility] off of [address] believes it is Dr. Emeterio Reeve Cardiologist - Denies  Chest x-ray - Not indicated EKG - Not indicated Stress Test - Yes years ago no follow up needed ECHO - denies Cardiac Cath - denies  Sleep Study - Yes no  OSA diagnosis  DM - Denies  ERAS Protcol -Yes PRE-SURGERY Ensure given  COVID TEST- 10/03/20  Anesthesia review: No   Patient denies shortness of breath, fever, cough and chest pain at PAT appointment   All instructions explained to the patient, with a verbal understanding of the material. Patient agrees to go over the instructions while at home for a better understanding. Patient also instructed to self quarantine after being tested for COVID-19. The opportunity to ask questions was provided.

## 2020-10-03 ENCOUNTER — Other Ambulatory Visit (HOSPITAL_COMMUNITY)
Admission: RE | Admit: 2020-10-03 | Discharge: 2020-10-03 | Disposition: A | Discharge status: Home / Self Care | Payer: PRIVATE HEALTH INSURANCE | Source: Ambulatory Visit | Attending: Obstetrics & Gynecology | Admitting: Obstetrics & Gynecology

## 2020-10-03 DIAGNOSIS — Z20822 Contact with and (suspected) exposure to covid-19: Secondary | ICD-10-CM | POA: Insufficient documentation

## 2020-10-03 DIAGNOSIS — Z01812 Encounter for preprocedural laboratory examination: Secondary | ICD-10-CM | POA: Insufficient documentation

## 2020-10-03 LAB — SARS CORONAVIRUS 2 (TAT 6-24 HRS): SARS Coronavirus 2: NEGATIVE

## 2020-10-07 ENCOUNTER — Observation Stay (HOSPITAL_COMMUNITY): Payer: No Typology Code available for payment source

## 2020-10-07 ENCOUNTER — Inpatient Hospital Stay (HOSPITAL_COMMUNITY)
Admission: RE | Admit: 2020-10-07 | Discharge: 2020-10-09 | DRG: 742 | Disposition: A | Discharge status: Home / Self Care | Payer: No Typology Code available for payment source | Attending: Obstetrics & Gynecology | Admitting: Obstetrics & Gynecology

## 2020-10-07 ENCOUNTER — Encounter (HOSPITAL_COMMUNITY): Payer: Self-pay | Admitting: Obstetrics & Gynecology

## 2020-10-07 ENCOUNTER — Encounter (HOSPITAL_COMMUNITY)
Admission: RE | Disposition: A | Discharge status: Home / Self Care | Payer: Self-pay | Source: Home / Self Care | Attending: Obstetrics & Gynecology

## 2020-10-07 ENCOUNTER — Observation Stay (HOSPITAL_COMMUNITY): Payer: No Typology Code available for payment source | Admitting: Physician Assistant

## 2020-10-07 ENCOUNTER — Other Ambulatory Visit: Payer: Self-pay

## 2020-10-07 DIAGNOSIS — Z6833 Body mass index (BMI) 33.0-33.9, adult: Secondary | ICD-10-CM | POA: Diagnosis not present

## 2020-10-07 DIAGNOSIS — D251 Intramural leiomyoma of uterus: Secondary | ICD-10-CM

## 2020-10-07 DIAGNOSIS — D62 Acute posthemorrhagic anemia: Secondary | ICD-10-CM | POA: Diagnosis not present

## 2020-10-07 DIAGNOSIS — Z419 Encounter for procedure for purposes other than remedying health state, unspecified: Secondary | ICD-10-CM

## 2020-10-07 DIAGNOSIS — D252 Subserosal leiomyoma of uterus: Principal | ICD-10-CM | POA: Diagnosis present

## 2020-10-07 DIAGNOSIS — N938 Other specified abnormal uterine and vaginal bleeding: Secondary | ICD-10-CM | POA: Diagnosis present

## 2020-10-07 DIAGNOSIS — Z79899 Other long term (current) drug therapy: Secondary | ICD-10-CM | POA: Diagnosis not present

## 2020-10-07 DIAGNOSIS — Z9011 Acquired absence of right breast and nipple: Secondary | ICD-10-CM

## 2020-10-07 DIAGNOSIS — Z5331 Laparoscopic surgical procedure converted to open procedure: Secondary | ICD-10-CM

## 2020-10-07 DIAGNOSIS — Z98891 History of uterine scar from previous surgery: Secondary | ICD-10-CM | POA: Diagnosis not present

## 2020-10-07 DIAGNOSIS — D259 Leiomyoma of uterus, unspecified: Secondary | ICD-10-CM | POA: Diagnosis present

## 2020-10-07 DIAGNOSIS — Z888 Allergy status to other drugs, medicaments and biological substances status: Secondary | ICD-10-CM

## 2020-10-07 DIAGNOSIS — N736 Female pelvic peritoneal adhesions (postinfective): Secondary | ICD-10-CM | POA: Diagnosis present

## 2020-10-07 DIAGNOSIS — N920 Excessive and frequent menstruation with regular cycle: Secondary | ICD-10-CM | POA: Diagnosis present

## 2020-10-07 DIAGNOSIS — E669 Obesity, unspecified: Secondary | ICD-10-CM | POA: Diagnosis present

## 2020-10-07 DIAGNOSIS — Z853 Personal history of malignant neoplasm of breast: Secondary | ICD-10-CM

## 2020-10-07 HISTORY — PX: UNILATERAL SALPINGECTOMY: SHX6160

## 2020-10-07 HISTORY — PX: LAPAROSCOPY: SHX197

## 2020-10-07 HISTORY — PX: CYSTOSCOPY: SHX5120

## 2020-10-07 HISTORY — PX: SUPRACERVICAL ABDOMINAL HYSTERECTOMY: SHX5393

## 2020-10-07 LAB — CBC
HCT: 29.4 % — ABNORMAL LOW (ref 36.0–46.0)
Hemoglobin: 9.6 g/dL — ABNORMAL LOW (ref 12.0–15.0)
MCH: 27.5 pg (ref 26.0–34.0)
MCHC: 32.7 g/dL (ref 30.0–36.0)
MCV: 84.2 fL (ref 80.0–100.0)
Platelets: 177 10*3/uL (ref 150–400)
RBC: 3.49 MIL/uL — ABNORMAL LOW (ref 3.87–5.11)
RDW: 16.6 % — ABNORMAL HIGH (ref 11.5–15.5)
WBC: 8.2 10*3/uL (ref 4.0–10.5)
nRBC: 0 % (ref 0.0–0.2)

## 2020-10-07 LAB — CREATININE, SERUM
Creatinine, Ser: 0.5 mg/dL (ref 0.44–1.00)
GFR, Estimated: >60 mL/min (ref >60)

## 2020-10-07 LAB — POCT PREGNANCY, URINE: Preg Test, Ur: NEGATIVE

## 2020-10-07 SURGERY — CYSTOSCOPY
Anesthesia: General | Site: Bladder | Laterality: Right

## 2020-10-07 MED ORDER — MIDAZOLAM HCL 2 MG/2ML IJ SOLN
INTRAMUSCULAR | Status: AC
  Filled 2020-10-07: qty 2

## 2020-10-07 MED ORDER — SCOPOLAMINE 1 MG/3DAYS TD PT72: 1.0000 | MEDICATED_PATCH | TRANSDERMAL | Status: DC

## 2020-10-07 MED ORDER — TRANEXAMIC ACID-NACL 1000-0.7 MG/100ML-% IV SOLN
INTRAVENOUS | Status: AC
  Filled 2020-10-07: qty 100

## 2020-10-07 MED ORDER — FENTANYL CITRATE (PF) 250 MCG/5ML IJ SOLN
INTRAMUSCULAR | Status: AC
  Filled 2020-10-07: qty 5

## 2020-10-07 MED ORDER — LACTATED RINGERS IV SOLN: INTRAVENOUS | Status: DC

## 2020-10-07 MED ORDER — LIDOCAINE 2% (20 MG/ML) 5 ML SYRINGE
INTRAMUSCULAR | Status: AC
  Filled 2020-10-07: qty 5

## 2020-10-07 MED ORDER — CHLORHEXIDINE GLUCONATE CLOTH 2 % EX PADS
6.0000 | MEDICATED_PAD | Freq: Every day | CUTANEOUS | Status: DC
  Administered 2020-10-08 – 2020-10-09 (×2): 6 via TOPICAL

## 2020-10-07 MED ORDER — 0.9 % SODIUM CHLORIDE (POUR BTL) OPTIME
TOPICAL | Status: DC | PRN
  Administered 2020-10-07 (×2): 1000 mL

## 2020-10-07 MED ORDER — FENTANYL CITRATE (PF) 250 MCG/5ML IJ SOLN
INTRAMUSCULAR | Status: DC | PRN
  Administered 2020-10-07: 50 ug via INTRAVENOUS
  Administered 2020-10-07: 100 ug via INTRAVENOUS
  Administered 2020-10-07: 50 ug via INTRAVENOUS
  Administered 2020-10-07 (×2): 25 ug via INTRAVENOUS

## 2020-10-07 MED ORDER — ACETAMINOPHEN 500 MG PO TABS
ORAL_TABLET | ORAL | Status: AC
  Administered 2020-10-07: 1000 mg via ORAL
  Filled 2020-10-07: qty 2

## 2020-10-07 MED ORDER — CEFAZOLIN SODIUM-DEXTROSE 2-4 GM/100ML-% IV SOLN
2.0000 g | INTRAVENOUS | Status: AC
  Administered 2020-10-07: 2 g via INTRAVENOUS

## 2020-10-07 MED ORDER — OXYCODONE HCL 5 MG PO TABS
5.0000 mg | ORAL_TABLET | ORAL | Status: DC | PRN
  Administered 2020-10-07 – 2020-10-09 (×7): 10 mg via ORAL
  Filled 2020-10-07: qty 2
  Filled 2020-10-07: qty 1
  Filled 2020-10-07 (×2): qty 2
  Filled 2020-10-07: qty 1
  Filled 2020-10-07 (×3): qty 2

## 2020-10-07 MED ORDER — HYDROMORPHONE HCL 1 MG/ML IJ SOLN
INTRAMUSCULAR | Status: AC
  Filled 2020-10-07: qty 0.5

## 2020-10-07 MED ORDER — HYDROMORPHONE HCL 1 MG/ML IJ SOLN
INTRAMUSCULAR | Status: DC | PRN
  Administered 2020-10-07 (×2): .5 mg via INTRAVENOUS

## 2020-10-07 MED ORDER — ONDANSETRON HCL 4 MG/2ML IJ SOLN: 4.0000 mg | Freq: Four times a day (QID) | INTRAMUSCULAR | Status: DC | PRN

## 2020-10-07 MED ORDER — ACETAMINOPHEN 500 MG PO TABS: 1000.0000 mg | ORAL_TABLET | Freq: Once | ORAL | Status: AC

## 2020-10-07 MED ORDER — CEFAZOLIN SODIUM-DEXTROSE 2-4 GM/100ML-% IV SOLN
INTRAVENOUS | Status: AC
  Filled 2020-10-07: qty 100

## 2020-10-07 MED ORDER — PROPOFOL 500 MG/50ML IV EMUL
INTRAVENOUS | Status: DC | PRN
  Administered 2020-10-07: 25 ug/kg/min via INTRAVENOUS

## 2020-10-07 MED ORDER — ALBUMIN HUMAN 5 % IV SOLN: INTRAVENOUS | Status: DC | PRN

## 2020-10-07 MED ORDER — ONDANSETRON HCL 4 MG PO TABS: 4.0000 mg | ORAL_TABLET | Freq: Four times a day (QID) | ORAL | Status: DC | PRN

## 2020-10-07 MED ORDER — BUPIVACAINE HCL (PF) 0.25 % IJ SOLN
INTRAMUSCULAR | Status: AC
  Filled 2020-10-07: qty 30

## 2020-10-07 MED ORDER — OXYCODONE HCL 5 MG/5ML PO SOLN: 5.0000 mg | Freq: Once | ORAL | Status: DC | PRN

## 2020-10-07 MED ORDER — ROCURONIUM BROMIDE 100 MG/10ML IV SOLN
INTRAVENOUS | Status: DC | PRN
  Administered 2020-10-07: 70 mg via INTRAVENOUS
  Administered 2020-10-07: 30 mg via INTRAVENOUS

## 2020-10-07 MED ORDER — PROMETHAZINE HCL 25 MG/ML IJ SOLN: 6.2500 mg | INTRAMUSCULAR | Status: DC | PRN

## 2020-10-07 MED ORDER — OXYCODONE HCL 5 MG PO TABS: 5.0000 mg | ORAL_TABLET | Freq: Once | ORAL | Status: DC | PRN

## 2020-10-07 MED ORDER — HYDROMORPHONE HCL 1 MG/ML IJ SOLN
0.2500 mg | INTRAMUSCULAR | Status: DC | PRN
  Administered 2020-10-07 (×2): 0.5 mg via INTRAVENOUS

## 2020-10-07 MED ORDER — ONDANSETRON HCL 4 MG/2ML IJ SOLN
INTRAMUSCULAR | Status: AC
  Filled 2020-10-07: qty 2

## 2020-10-07 MED ORDER — ENOXAPARIN SODIUM 40 MG/0.4ML ~~LOC~~ SOLN
40.0000 mg | SUBCUTANEOUS | Status: DC
  Administered 2020-10-08 – 2020-10-09 (×2): 40 mg via SUBCUTANEOUS
  Filled 2020-10-07 (×2): qty 0.4

## 2020-10-07 MED ORDER — SODIUM CHLORIDE 0.9 % IR SOLN
Status: DC | PRN
Start: 2020-10-07 — End: 2020-10-07
  Administered 2020-10-07: 3000 mL via INTRAVESICAL

## 2020-10-07 MED ORDER — METHYLENE BLUE 0.5 % INJ SOLN
INTRAVENOUS | Status: DC | PRN
  Administered 2020-10-07 (×2): 20 mg via INTRAVENOUS

## 2020-10-07 MED ORDER — CHLORHEXIDINE GLUCONATE 0.12 % MT SOLN: 15.0000 mL | Freq: Once | OROMUCOSAL | Status: AC

## 2020-10-07 MED ORDER — ORAL CARE MOUTH RINSE: 15.0000 mL | Freq: Once | OROMUCOSAL | Status: AC

## 2020-10-07 MED ORDER — PROPOFOL 10 MG/ML IV BOLUS
INTRAVENOUS | Status: DC | PRN
  Administered 2020-10-07: 30 mg via INTRAVENOUS
  Administered 2020-10-07: 200 mg via INTRAVENOUS
  Administered 2020-10-07: 20 mg via INTRAVENOUS

## 2020-10-07 MED ORDER — HYDROMORPHONE HCL 1 MG/ML IJ SOLN
0.2000 mg | INTRAMUSCULAR | Status: DC | PRN
Start: 2020-10-07 — End: 2020-10-09
  Administered 2020-10-07: 0.6 mg via INTRAVENOUS
  Administered 2020-10-07: 0.5 mg via INTRAVENOUS
  Filled 2020-10-07 (×2): qty 1

## 2020-10-07 MED ORDER — ONDANSETRON HCL 4 MG/2ML IJ SOLN
INTRAMUSCULAR | Status: DC | PRN
  Administered 2020-10-07: 4 mg via INTRAVENOUS

## 2020-10-07 MED ORDER — CHLORHEXIDINE GLUCONATE 0.12 % MT SOLN
OROMUCOSAL | Status: AC
  Administered 2020-10-07: 15 mL via OROMUCOSAL
  Filled 2020-10-07: qty 15

## 2020-10-07 MED ORDER — METHYLENE BLUE 0.5 % INJ SOLN
INTRAVENOUS | Status: AC
  Filled 2020-10-07: qty 10

## 2020-10-07 MED ORDER — PROPOFOL 10 MG/ML IV BOLUS
INTRAVENOUS | Status: AC
  Filled 2020-10-07: qty 20

## 2020-10-07 MED ORDER — LIDOCAINE 2% (20 MG/ML) 5 ML SYRINGE
INTRAMUSCULAR | Status: DC | PRN
  Administered 2020-10-07: 80 mg via INTRAVENOUS

## 2020-10-07 MED ORDER — AMISULPRIDE (ANTIEMETIC) 5 MG/2ML IV SOLN: 10.0000 mg | Freq: Once | INTRAVENOUS | Status: DC | PRN

## 2020-10-07 MED ORDER — SCOPOLAMINE 1 MG/3DAYS TD PT72
MEDICATED_PATCH | TRANSDERMAL | Status: AC
  Administered 2020-10-07: 1.5 mg via TRANSDERMAL
  Filled 2020-10-07: qty 1

## 2020-10-07 MED ORDER — MIDAZOLAM HCL 5 MG/5ML IJ SOLN
INTRAMUSCULAR | Status: DC | PRN
  Administered 2020-10-07: 2 mg via INTRAVENOUS

## 2020-10-07 MED ORDER — SUGAMMADEX SODIUM 200 MG/2ML IV SOLN
INTRAVENOUS | Status: DC | PRN
  Administered 2020-10-07: 200 mg via INTRAVENOUS

## 2020-10-07 MED ORDER — HEMOSTATIC AGENTS (NO CHARGE) OPTIME
TOPICAL | Status: DC | PRN
  Administered 2020-10-07: 1 via TOPICAL

## 2020-10-07 MED ORDER — TRANEXAMIC ACID-NACL 1000-0.7 MG/100ML-% IV SOLN
INTRAVENOUS | Status: DC | PRN
  Administered 2020-10-07: 1000 mg via INTRAVENOUS

## 2020-10-07 MED ORDER — LIDOCAINE-EPINEPHRINE 1 %-1:100000 IJ SOLN
INTRAMUSCULAR | Status: AC
  Filled 2020-10-07: qty 1

## 2020-10-07 MED ORDER — DEXAMETHASONE SODIUM PHOSPHATE 10 MG/ML IJ SOLN
INTRAMUSCULAR | Status: DC | PRN
  Administered 2020-10-07: 10 mg via INTRAVENOUS

## 2020-10-07 MED ORDER — PROPOFOL 1000 MG/100ML IV EMUL
INTRAVENOUS | Status: AC
  Filled 2020-10-07: qty 300

## 2020-10-07 MED ORDER — HYDROMORPHONE HCL 1 MG/ML IJ SOLN
INTRAMUSCULAR | Status: AC
  Filled 2020-10-07: qty 1

## 2020-10-07 MED ORDER — ACETAMINOPHEN 500 MG PO TABS
1000.0000 mg | ORAL_TABLET | Freq: Four times a day (QID) | ORAL | Status: DC
  Administered 2020-10-07 – 2020-10-09 (×7): 1000 mg via ORAL
  Filled 2020-10-07 (×8): qty 2

## 2020-10-07 MED ORDER — LIDOCAINE-EPINEPHRINE 1 %-1:100000 IJ SOLN: INTRAMUSCULAR | Status: DC | PRN

## 2020-10-07 MED ORDER — BUPIVACAINE HCL (PF) 0.25 % IJ SOLN: INTRAMUSCULAR | Status: DC | PRN

## 2020-10-07 MED ORDER — POVIDONE-IODINE 10 % EX SWAB
2.0000 "application " | Freq: Once | CUTANEOUS | Status: AC
  Administered 2020-10-07: 2 via TOPICAL

## 2020-10-07 MED ORDER — ESMOLOL HCL 100 MG/10ML IV SOLN
INTRAVENOUS | Status: DC | PRN
  Administered 2020-10-07: 20 mg via INTRAVENOUS
  Administered 2020-10-07 (×2): 10 mg via INTRAVENOUS

## 2020-10-07 MED ORDER — ROCURONIUM BROMIDE 10 MG/ML (PF) SYRINGE
PREFILLED_SYRINGE | INTRAVENOUS | Status: AC
  Filled 2020-10-07: qty 10

## 2020-10-07 MED ORDER — DEXAMETHASONE SODIUM PHOSPHATE 10 MG/ML IJ SOLN
INTRAMUSCULAR | Status: AC
  Filled 2020-10-07: qty 1

## 2020-10-07 SURGICAL SUPPLY — 47 items
CABLE HIGH FREQUENCY MONO STRZ (ELECTRODE) IMPLANT
COVER MAYO STAND STRL (DRAPES) ×5 IMPLANT
DERMABOND ADVANCED (GAUZE/BANDAGES/DRESSINGS) ×1
DERMABOND ADVANCED .7 DNX12 (GAUZE/BANDAGES/DRESSINGS) ×4 IMPLANT
DRSG OPSITE POSTOP 3X4 (GAUZE/BANDAGES/DRESSINGS) IMPLANT
DRSG OPSITE POSTOP 4X8 (GAUZE/BANDAGES/DRESSINGS) ×5 IMPLANT
DRSG PAD ABDOMINAL 8X10 ST (GAUZE/BANDAGES/DRESSINGS) ×5 IMPLANT
DURAPREP 26ML APPLICATOR (WOUND CARE) ×5 IMPLANT
ELECT PENCIL ROCKER SW 15FT (MISCELLANEOUS) ×5 IMPLANT
FILTER SMOKE EVAC LAPAROSHD (FILTER) ×5 IMPLANT
GAUZE SPONGE 4X4 12PLY STRL (GAUZE/BANDAGES/DRESSINGS) ×5 IMPLANT
GLOVE BIO SURGEON STRL SZ 6.5 (GLOVE) ×5 IMPLANT
GLOVE SURG UNDER POLY LF SZ7 (GLOVE) ×20 IMPLANT
HEMOSTAT ARISTA ABSORB 3G PWDR (HEMOSTASIS) ×5 IMPLANT
KIT TURNOVER KIT B (KITS) ×5 IMPLANT
LEGGING LITHOTOMY PAIR STRL (DRAPES) ×5 IMPLANT
MANIPULATOR UTERINE 4.5 ZUMI (MISCELLANEOUS) ×5 IMPLANT
NEEDLE INSUFFLATION 14GA 120MM (NEEDLE) ×5 IMPLANT
NS IRRIG 1000ML POUR BTL (IV SOLUTION) ×5 IMPLANT
PACK LAVH (CUSTOM PROCEDURE TRAY) ×5 IMPLANT
PACK ROBOTIC GOWN (GOWN DISPOSABLE) ×5 IMPLANT
PACK TRENDGUARD 450 HYBRID PRO (MISCELLANEOUS) ×4 IMPLANT
PROTECTOR NERVE ULNAR (MISCELLANEOUS) ×10 IMPLANT
SET CYSTO W/LG BORE CLAMP LF (SET/KITS/TRAYS/PACK) ×5 IMPLANT
SET IRRIG TUBING LAPAROSCOPIC (IRRIGATION / IRRIGATOR) IMPLANT
SET TUBE SMOKE EVAC HIGH FLOW (TUBING) ×5 IMPLANT
SHEARS HARMONIC ACE PLUS 36CM (ENDOMECHANICALS) ×5 IMPLANT
SLEEVE ENDOPATH XCEL 5M (ENDOMECHANICALS) ×5 IMPLANT
SPONGE LAP 18X18 RF (DISPOSABLE) ×10 IMPLANT
STRIP CLOSURE SKIN 1/2X4 (GAUZE/BANDAGES/DRESSINGS) IMPLANT
SUT PLAIN 2 0 XLH (SUTURE) ×5 IMPLANT
SUT VIC AB 0 CT1 18XCR BRD8 (SUTURE) ×12 IMPLANT
SUT VIC AB 0 CT1 36 (SUTURE) ×15 IMPLANT
SUT VIC AB 0 CT1 8-18 (SUTURE) ×3
SUT VIC AB 2-0 CT1 27 (SUTURE) ×2
SUT VIC AB 2-0 CT1 TAPERPNT 27 (SUTURE) ×8 IMPLANT
SUT VIC AB 4-0 PS2 18 (SUTURE) ×5 IMPLANT
SUT VICRYL 0 TIES 12 18 (SUTURE) ×5 IMPLANT
SUT VICRYL 0 UR6 27IN ABS (SUTURE) IMPLANT
SUT VICRYL 4-0 PS2 18IN ABS (SUTURE) ×5 IMPLANT
TAPE CLOTH SURG 6X10 WHT LF (GAUZE/BANDAGES/DRESSINGS) ×5 IMPLANT
TOWEL GREEN STERILE FF (TOWEL DISPOSABLE) ×10 IMPLANT
TRAY FOLEY W/BAG SLVR 14FR (SET/KITS/TRAYS/PACK) ×5 IMPLANT
TRENDGUARD 450 HYBRID PRO PACK (MISCELLANEOUS) ×5
TROCAR OPTI TIP 5M 100M (ENDOMECHANICALS) ×5 IMPLANT
UNDERPAD 30X36 HEAVY ABSORB (UNDERPADS AND DIAPERS) ×5 IMPLANT
WARMER LAPAROSCOPE (MISCELLANEOUS) ×5 IMPLANT

## 2020-10-07 NOTE — Progress Notes (Signed)
Pt admitted to 6N30 in bed from PACU with foley in place draining blue urine from dye.  Pt sleepy but arouseable. LR infusing at 125.

## 2020-10-07 NOTE — Anesthesia Postprocedure Evaluation (Signed)
Anesthesia Post Note  Patient: Cheryl Holloway  Procedure(s) Performed: CYSTOSCOPY (N/A Bladder) HYSTERECTOMY SUPRACERVICAL ABDOMINAL (N/A Abdomen) LAPAROSCOPY DIAGNOSTIC (N/A Abdomen) UNILATERAL SALPINGECTOMY (Right Abdomen)     Patient location during evaluation: PACU Anesthesia Type: General Level of consciousness: awake Pain management: pain level controlled Vital Signs Assessment: post-procedure vital signs reviewed and stable Respiratory status: spontaneous breathing and respiratory function stable Cardiovascular status: stable Postop Assessment: no apparent nausea or vomiting Anesthetic complications: no   No complications documented.  Last Vitals:  Vitals:   10/07/20 1617 10/07/20 1647  BP:    Pulse: 97   Resp: 15   Temp:  (!) 36.1 C  SpO2: 99%     Last Pain:  Vitals:   10/07/20 1617  TempSrc:   PainSc: 5                  Candra R Javarious Elsayed

## 2020-10-07 NOTE — H&P (Signed)
HPI Cheryl Holloway is a 45 y.o. female.  [id number] Patient's last menstrual period was 04/07/2020 (approximate). who presents to clinic today for uterine fibroids and menorrhagia. She states she had imaging done in 2015 that found her fibroids but has not had follow-up imaging due to lack of insurance. She describes her period occurring irregularly, every 2-3 months, and lasting 3-6 weeks at a time. Her bleeding is heavy and she goes through 4 super tampons and 4 heavy pads per day on average. She endorses moderate-to-severe pelvic cramping during menstruation that limits her daily activities. Alleviating factors include applying heat to lower abdomen, patient denies aggravating factors. She had a BTL performed after her last birth 11 years ago and desires a hysterectomy.  HPI      Past Medical History:  Diagnosis Date  . Anemia    takes iron  . Breast cancer ([facility])   . Fibroids    uterine         Past Surgical History:  Procedure Laterality Date  . BREAST IMPLANT REMOVAL Right 04/25/2014   Procedure: REMOVAL BREAST IMPLANT WITH CAPSULECTOMY;  Surgeon: Theodoro Kos, DO;  Location: [facility];  Service: Plastics;  Laterality: Right;  . BREAST RECONSTRUCTION    . BREAST REDUCTION SURGERY    . CESAREAN SECTION     x3  . ESOPHAGOGASTRODUODENOSCOPY N/A 01/02/2016   Procedure: ESOPHAGOGASTRODUODENOSCOPY (EGD);  Surgeon: Clarene Essex, MD;  Location: [facility];  Service: Endoscopy;  Laterality: N/A;  . GASTRIC BYPASS    . MASTECTOMY     right  . SIMPLE MASTECTOMY WITH AXILLARY SENTINEL NODE BIOPSY Left 04/25/2014   Procedure: LEFT TOTAL  MASTECTOMY (PROPHYLACTIC);  Surgeon: Rolm Bookbinder, MD;  Location: [facility];  Service: General;  Laterality: Left;  . TUBAL LIGATION           Family History  Problem Relation Age of Onset  . Healthy Mother     Social History Social History        Tobacco Use  . Smoking status:  Never Smoker  . Smokeless tobacco: Never Used  Vaping Use  . Vaping Use: Never used  Substance Use Topics  . Alcohol use: Yes    Comment: social  . Drug use: No        Allergies  Allergen Reactions  . Ibuprofen Hives          Current Outpatient Medications  Medication Sig Dispense Refill  . Prenatal Vit-Fe Fumarate-FA (PRENATAL MULTIVITAMIN) TABS tablet Take 1 tablet by mouth daily at 12 noon.    . hydrOXYzine (ATARAX/VISTARIL) 25 MG tablet Take 0.5-1 tablets (12.5-25 mg total) by mouth every 8 (eight) hours as needed for anxiety. (Patient not taking: Reported on 03/17/2020) 60 tablet 0  . methocarbamol (ROBAXIN) 500 MG tablet Take 1 tablet (500 mg total) by mouth 2 (two) times daily. (Patient not taking: Reported on 05/19/2020) 20 tablet 0  . tranexamic acid (LYSTEDA) 650 MG TABS tablet Take 2 tablets (1,300 mg total) by mouth 3 (three) times daily. Take during menses for a maximum of five days (Patient not taking: Reported on 06/19/2020) 30 tablet 2   No current facility-administered medications for this visit.    Review of Systems Review of Systems  Constitutional: Negative.   HENT: Negative.   Respiratory: Negative.   Cardiovascular: Negative.   Gastrointestinal: Negative.   Genitourinary: Positive for menstrual problem and pelvic pain. Negative for dysuria, vaginal bleeding and vaginal discharge.    Blood pressure 131/75, pulse  94, temperature 97.6 F (36.4 C), temperature source Oral, resp. rate 18, height 5\' 5"  (1.651 m), weight 99.8 kg, last menstrual period 04/02/2020, SpO2 100 %.   Physical Exam Constitutional:      Appearance: She is obese. She is not ill-appearing.  Cardiovascular:     Rate and Rhythm: Normal rate.  Pulmonary:     Effort: Pulmonary effort is normal.  Neurological:     Mental Status: She is alert.  Psychiatric:        Mood and Affect: Mood normal.        Behavior: Behavior normal.     Data Reviewed Narrative &  Impression  CLINICAL DATA: Dysfunctional uterine bleeding, uterine fibroid, history breast cancer; LMP August 2021 specific date not provided; history tubal ligation, Caesarean section  EXAM: TRANSABDOMINAL AND TRANSVAGINAL ULTRASOUND OF PELVIS  TECHNIQUE: Both transabdominal and transvaginal ultrasound examinations of the pelvis were performed. Transabdominal technique was performed for global imaging of the pelvis including uterus, ovaries, adnexal regions, and pelvic cul-de-sac. It was necessary to proceed with endovaginal exam following the transabdominal exam to visualize the endometrium and ovaries.  COMPARISON: None  FINDINGS: Uterus  Measurements: 11.3 x 5.4 x 7.6 cm = volume: 242 mL. Anteverted. Lobulated appearance of the mid upper uterus due to presence of multiple uterine leiomyomata. These include a transmural fundal leiomyoma 6.7 cm greatest diameter, posterior wall leiomyoma to LEFT 3.6 cm greatest diameter, and a small submucosal upper uterine anterior leiomyoma 2.2 cm diameter.  Endometrium  Thickness: 5 mm. Difficult to visualize due to distortion from uterine leiomyomata. No obvious endometrial fluid.  Right ovary  Measurements: 2.3 x 1.9 x 2.4 cm = volume: 5.5 mL. Normal morphology without mass  Left ovary  Measurements: 2.5 x 1.7 x 1.6 cm = volume: 3.3 mL. Normal morphology without mass  Other findingsTrace free pelvic fluid. No adnexal masses.  IMPRESSION: Mildly enlarged uterus containing at least 3 uterine leiomyomata, largest 6.7 mm diameter.   Electronically Signed By: Lavonia Dana M.D. On: 06/12/2020 09:09      Assessment DUB, menorrhagia with uterine fibroids She is a candidate for [facility] or LAVH  Plan Patient desires surgical management with LAVH and BS.  She has a h/o CS x3. The risks of surgery were discussed in detail with the patient including but not limited to: bleeding which may require  transfusion or reoperation; infection which may require prolonged hospitalization or re-hospitalization and antibiotic therapy; injury to bowel, bladder, ureters and major vessels or other surrounding organs; formation of adhesions;need for additional procedures including laparotomy or subsequent procedures secondary to abnormal pathology; thromboembolic phenomenon; incisional problems and other postoperative or anesthesia complications.  Patient was told that the likelihood that her condition and symptoms will be treated effectively with this surgical management was very high; the postoperative expectations were also discussed in detail. The patient also understands the alternative treatment options which were discussed in full. All questions were answered.    Woodroe Mode, MD 10/07/2020 10:45 AM

## 2020-10-07 NOTE — Transfer of Care (Signed)
Immediate Anesthesia Transfer of Care Note  Patient: Cheryl Holloway  Procedure(s) Performed: CYSTOSCOPY (N/A Bladder) HYSTERECTOMY SUPRACERVICAL ABDOMINAL (N/A Abdomen) LAPAROSCOPY DIAGNOSTIC (N/A Abdomen) UNILATERAL SALPINGECTOMY (Right Abdomen)  Patient Location: PACU  Anesthesia Type:General  Level of Consciousness: awake and alert   Airway & Oxygen Therapy: Patient Spontanous Breathing and Patient connected to nasal cannula oxygen  Post-op Assessment: Report given to RN and Post -op Vital signs reviewed and stable  Post vital signs: Reviewed and stable  Last Vitals:  Vitals Value Taken Time  BP 124/79 10/07/20 1604  Temp    Pulse 100 10/07/20 1605  Resp 11 10/07/20 1605  SpO2 100 % 10/07/20 1605  Vitals shown include unvalidated device data.  Last Pain:  Vitals:   10/07/20 0816  TempSrc:   PainSc: 0-No pain         Complications: No complications documented.

## 2020-10-07 NOTE — Op Note (Addendum)
Cheryl Holloway PROCEDURE DATE: 10/07/2020  PREOPERATIVE DIAGNOSES:  Symptomatic fibroids, abnormal uterine bleeding POSTOPERATIVE DIAGNOSES:  The same, pelvic adhesions SURGEON:  Dr. Emeterio Reeve   ASSISTANT:  Dr. Arvilla Meres.  An experienced assistant was required given the standard of surgical care given the complexity of the case.  This assistant was needed for exposure, dissection, suctioning, retraction, instrument exchange, and for overall help during the procedure. OPERATION:  Diagnostic laparoscopy,Supracervical abdominal hysterectomy, right Salpingectomy, cystoscopy ANESTHESIA:  General endotracheal.  INDICATIONS: The patient is a 45 y.o. [id number] with the aforementioned diagnoses who desires definitive surgical management. On the preoperative visit, the risks, benefits, indications, and alternatives of the procedure were reviewed with the patient.  On the day of surgery, the risks of surgery were again discussed with the patient including but not limited to: bleeding which may require transfusion or reoperation; infection which may require antibiotics; injury to bowel, bladder, ureters or other surrounding organs; need for additional procedures; thromboembolic phenomenon, incisional problems and other postoperative/anesthesia complications. Written informed consent was obtained.    OPERATIVE FINDINGS: A 10 week size uterus with normal tubes and ovaries bilaterally.  ESTIMATED BLOOD LOSS: 300 ml FLUIDS:  1500 ml of Lactated Ringers, 500 ml albumin URINE OUTPUT:  300 ml of clear yellow urine. SPECIMENS:  Uterus fundus right fallopian tube sent to pathology COMPLICATIONS:  None immediate.   DESCRIPTION OF PROCEDURE:  The patient received intravenous antibiotics and had sequential compression devices applied to her lower extremities while in the preoperative area.   She was taken to the operating room and placed under general anesthesia without difficulty.The abdomen and perineum were prepped  and draped in a sterile manner, and she was placed in a dorsal supine position.  A Foley catheter was inserted into the bladder and attached to constant drainage. Patient was consented for planned LAVH BS due to history of 3 cesarean sections and BTL. After an adequate timeout was performed, a Zumi uterine manipulator was placed transcervical. A 6 mm skin incision was made at the umbilicus and Veress needle was inserted and CO2 was insufflated. 5 mm trocar was inserted and laparoscopy was performed. Due to intraabdominal adhesions and pelvic adhesions and the position of the fibroids Dr. Rosana Hoes and I agreed there was poor access to the uterine vasculature via laparoscopy. The procedure converted to open abdominal approach. The uterine instrument was removed and  a Pfannensteil skin incision was made. This incision was taken down to the fascia using electrocautery with care given to maintain good hemostasis. The fascia was incised in the midline and the fascial incision was then extended bilaterally using electrocautery without difficulty. The fascia was then dissected off the underlying rectus muscles using blunt and sharp dissection. The rectus muscles were split bluntly in the midline and the peritoneum entered sharply without complication. This peritoneal incision was then extended superiorly and inferiorly with care given to prevent bowel or bladder injury. Attention was then turned to the pelvis. A retractor was placed into the incision, and the bowel was packed away with moist laparotomy sponges. The uterus at this point was noted to be mobilized and was delivered up out of the abdomen. There were multiple subserosal fibroids and the bladder had thick adhesions The round ligaments on each side were clamped, suture ligated with 0 Vicryl, and transected with electrocautery allowing entry into the broad ligament. Of note, all sutures used in this procedure are 0 Vicryl unless otherwise noted.  Adnexae were clamped  on the patient's right side,  cut, and doubly suture ligated. This procedure was repeated in an identical fashion on the left site allowing for both adnexa to remain in place.  Kelly clamps were placed on the mesosalpinx of the right fallopian tube, and the fallopian tube was excised.  The pedicle was then secured with a suture tie. The left tube was encased with adhesions to the ovary and we elected to not remove the tube on that side.  The bladder was dissected with dense adhesions noted.    The uterine arteries were then skeletonized bilaterally and then clamped, cut, and doubly suture ligated with care given to prevent ureteral injury. The cervix was elongated and the bladder was difficult to dissect. We proceeded to subtotal hysterectomy  The uterus was then amputated across the lower uterine segment leaving the cervix intact. The specimen was sent to pathology. The anterior and posterior peritoneal edges were then reapproximated in the midline over the cervical stump without complication.  Hemostasis was reconfirmed at all pedicles and along the pelvic sidewall.  .  All laparotomy sponges and instruments were removed from the abdomen. The peritoneum was closed with a running stitch, and the fascia was also closed in a running fashion. The subcutaneous layer was reapproximated with 2-0 plain gut. The skin was closed with a 4-0 Vicryl subcuticular stitch.The incision at the umbilicus was closed with Dermabond.  Diagnostic cystoscopy was done with saline. She received 40 mg of methaline blue.After one hr both ureters were observe with efflux of urine with blue stain. We spoke t Dr. Gloriann Loan from urology as he was prepared to do retrograde pyelogram and we agreed we had reassurance the ureters were patent. Sponge, lap, needle, and instrument counts were correct times two. The patient was taken to the recovery area awake, extubated and in stable condition.   Woodroe Mode, MD [place], [facility], [place]

## 2020-10-07 NOTE — Anesthesia Procedure Notes (Addendum)
Procedure Name: Intubation Date/Time: 10/07/2020 11:56 AM Performed by: Hendricks Limes, CRNA Pre-anesthesia Checklist: Patient identified, Emergency Drugs available, Suction available and Patient being monitored Patient Re-evaluated:Patient Re-evaluated prior to induction Oxygen Delivery Method: Circle system utilized Preoxygenation: Pre-oxygenation with 100% oxygen Induction Type: IV induction Ventilation: Mask ventilation without difficulty Laryngoscope Size: Mac and 3 Grade View: Grade I Tube type: Oral Tube size: 7.0 mm Number of attempts: 1 Airway Equipment and Method: Stylet Placement Confirmation: ETT inserted through vocal cords under direct vision,  positive ETCO2 and breath sounds checked- equal and bilateral Secured at: 21 cm Tube secured with: Tape Dental Injury: Teeth and Oropharynx as per pre-operative assessment  Comments: Performed by Sueanne Margarita, SRNA

## 2020-10-07 NOTE — Anesthesia Preprocedure Evaluation (Signed)
Anesthesia Evaluation  Patient identified by MRN, date of birth, ID band Patient awake    Reviewed: Allergy & Precautions, NPO status , Patient's Chart, lab work & pertinent test results  Airway Mallampati: II  TM Distance: >3 FB Neck ROM: Full    Dental no notable dental hx.    Pulmonary neg pulmonary ROS,    Pulmonary exam normal breath sounds clear to auscultation       Cardiovascular Exercise Tolerance: Good negative cardio ROS Normal cardiovascular exam Rhythm:Regular Rate:Normal     Neuro/Psych negative neurological ROS  negative psych ROS   GI/Hepatic negative GI ROS, Neg liver ROS,   Endo/Other  obesity  Renal/GU negative Renal ROS  negative genitourinary   Musculoskeletal negative musculoskeletal ROS (+)   Abdominal Normal abdominal exam  (+)   Peds negative pediatric ROS (+)  Hematology negative hematology ROS (+) anemia ,   Anesthesia Other Findings Breast cancer  Reproductive/Obstetrics negative OB ROS                             Anesthesia Physical Anesthesia Plan  ASA: III  Anesthesia Plan: General   Post-op Pain Management:    Induction: Intravenous  PONV Risk Score and Plan: 3 and Midazolam, Scopolamine patch - Pre-op, Ondansetron, Dexamethasone and Treatment may vary due to age or medical condition  Airway Management Planned: Oral ETT  Additional Equipment: None  Intra-op Plan:   Post-operative Plan: Extubation in OR  Informed Consent: I have reviewed the patients History and Physical, chart, labs and discussed the procedure including the risks, benefits and alternatives for the proposed anesthesia with the patient or authorized representative who has indicated his/her understanding and acceptance.     Dental advisory given  Plan Discussed with: CRNA and Anesthesiologist  Anesthesia Plan Comments:         Anesthesia Quick Evaluation

## 2020-10-08 ENCOUNTER — Encounter (HOSPITAL_COMMUNITY): Payer: Self-pay | Admitting: Obstetrics & Gynecology

## 2020-10-08 LAB — CBC
HCT: 27.5 % — ABNORMAL LOW (ref 36.0–46.0)
Hemoglobin: 8.6 g/dL — ABNORMAL LOW (ref 12.0–15.0)
MCH: 26.8 pg (ref 26.0–34.0)
MCHC: 31.3 g/dL (ref 30.0–36.0)
MCV: 85.7 fL (ref 80.0–100.0)
Platelets: 196 10*3/uL (ref 150–400)
RBC: 3.21 MIL/uL — ABNORMAL LOW (ref 3.87–5.11)
RDW: 16.6 % — ABNORMAL HIGH (ref 11.5–15.5)
WBC: 6.9 10*3/uL (ref 4.0–10.5)
nRBC: 0 % (ref 0.0–0.2)

## 2020-10-08 NOTE — Progress Notes (Signed)
1 Day Post-Op Procedure(s) (LRB): CYSTOSCOPY (N/A) HYSTERECTOMY SUPRACERVICAL ABDOMINAL (N/A) LAPAROSCOPY DIAGNOSTIC (N/A) UNILATERAL SALPINGECTOMY (Right)  Subjective: Patient reports incisional pain and tolerating PO.  Eating small amount, no flatus or nausea  Objective:  Blood pressure 129/74, pulse 78, temperature 98.8 F (37.1 C), temperature source Oral, resp. rate 18, height 5\' 5"  (1.651 m), weight 99.8 kg, last menstrual period 04/02/2020, SpO2 100 %.  I have reviewed patient's vital signs, intake and output, medications and labs.  General: alert, cooperative and no distress Resp: normal effort GI: soft, non-tender; bowel sounds normal; no masses,  no organomegaly and incision: clean, dry and intact Extremities: extremities normal, atraumatic, no cyanosis or edema CBC    Component Value Date/Time   WBC 6.9 10/08/2020 0159   RBC 3.21 (L) 10/08/2020 0159   HGB 8.6 (L) 10/08/2020 0159   HGB 8.8 Repeated and Verified (L) 09/29/2015 0912   HCT 27.5 (L) 10/08/2020 0159   HCT 27.1 (L) 09/29/2015 0912   PLT 196 10/08/2020 0159   PLT 148 09/29/2015 0912   MCV 85.7 10/08/2020 0159   MCV 79.5 09/29/2015 0912   MCH 26.8 10/08/2020 0159   MCHC 31.3 10/08/2020 0159   RDW 16.6 (H) 10/08/2020 0159   RDW 16.1 (H) 09/29/2015 0912   LYMPHSABS 1.6 03/27/2016 1546   LYMPHSABS 1.2 09/29/2015 0912   MONOABS 0.5 03/27/2016 1546   MONOABS 0.4 09/29/2015 0912   EOSABS 0.0 03/27/2016 1546   EOSABS 0.0 09/29/2015 0912   BASOSABS 0.0 03/27/2016 1546   BASOSABS 0.0 09/29/2015 0912    Intake/Output Summary (Last 24 hours) at 10/08/2020 0926 Last data filed at 10/08/2020 0536 Gross per 24 hour  Intake 2680 ml  Output 3425 ml  Net -745 ml     Assessment: s/p Procedure(s): CYSTOSCOPY (N/A) HYSTERECTOMY SUPRACERVICAL ABDOMINAL (N/A) LAPAROSCOPY DIAGNOSTIC (N/A) UNILATERAL SALPINGECTOMY (Right): stable and progressing well Postop anemia likely with only modest blood loss coupled with  hydration Plan: Encourage ambulation Discontinue IV fluids discontinue foley  LOS: 1 day    Cheryl Holloway 10/08/2020, 9:23 AM

## 2020-10-08 NOTE — Progress Notes (Signed)
Patient reports blood in her urine. Unsure if it was from her bladder or her vagina. Asked patient to report if this happens again. Will notify oncoming shift.

## 2020-10-09 LAB — SURGICAL PATHOLOGY

## 2020-10-09 MED ORDER — OXYCODONE HCL 5 MG PO TABS: 5.0000 mg | ORAL_TABLET | ORAL | 0 refills | Status: DC | PRN

## 2020-10-09 NOTE — Discharge Instructions (Signed)
Abdominal Hysterectomy, Care After The following information offers guidance on how to care for yourself after your procedure. Your doctor may also give you more specific instructions. If you have problems or questions, contact your doctor. What can I expect after the procedure? After the procedure, it is common to have:  Pain.  Tiredness.  No desire to eat.  Less interest in sex.  Bleeding and fluid (discharge) from your vagina. You may need to use a pad after this procedure.  Trouble pooping (constipation).  Feelings of sadness or other emotions. Follow these instructions at home: Medicines  Take over-the-counter and prescription medicines only as told by your doctor.  Do not take aspirin or NSAIDs, such as ibuprofen. These medicines can cause bleeding.  If you were prescribed an antibiotic medicine, take it as told by your doctor. Do not stop using the antibiotic even if you start to feel better.  If told, take steps to prevent problems with pooping (constipation). You may need to: ? Drink enough fluid to keep your pee (urine) pale yellow. ? Take medicines. You will be told what medicines to take. ? Eat foods that are high in fiber. These include beans, whole grains, and fresh fruits and vegetables. ? Limit foods that are high in fat and sugar. These include fried or sweet foods.  Ask your doctor if you should avoid driving or using machines while you are taking your medicine. Surgical cut care  Follow instructions from your doctor about how to take care of your cut from surgery (incision). Make sure you: ? Wash your hands with soap and water for at least 20 seconds before and after you change your bandage. If you cannot use soap and water, use hand sanitizer. ? Change your bandage. ? Leave stitches or skin glue in place for at least two weeks. ? Leave tape strips alone unless you are told to take them off. You may trim the edges of the tape strips if they curl up.  Keep  the bandage dry until your doctor says it can be taken off.  Check your incision every day for signs of infection. Check for: ? More redness, swelling, or pain. ? Fluid or blood. ? Warmth. ? Pus or a bad smell.   Activity  Rest as told by your doctor.  Get up to take short walks every 1 to 2 hours. Ask for help if you feel weak or unsteady.  Do not lift anything that is heavier than 10 lb (4.5 kg), or the limit that you are told.  Follow your doctor's advice about exercise, driving, and general activities.  Return to your normal activities when your doctor says that it is safe.   Lifestyle  Do not douche, use tampons, or have sex for at least 6 weeks or as told by your doctor.  Do not drink alcohol until your doctor says it is okay.  Do not smoke or use any products that contain nicotine or tobacco. These can delay healing after surgery. If you need help quitting, ask your doctor. General instructions  Do not take baths, swim, or use a hot tub. Ask your doctor about taking showers or sponge baths.  Try to have a responsible adult at home with you for the first 1-2 weeks to help with your daily chores.  Wear tight-fitting (compression) stockings as told by your doctor.  Keep all follow-up visits. Contact a doctor if:  You have chills or a fever.  You have any of these signs  of infection around your cut: ? More redness, swelling, or pain. ? Fluid or blood. ? Warmth. ? Pus or a bad smell.  Your cut breaks open.  You feel dizzy or light-headed.  You have pain or bleeding when you pee.  You keep having watery poop (diarrhea).  You keep feeling like you may vomit or you keep vomiting.  You have fluid coming from your vagina that is not normal.  You have any type of reaction to your medicine that is not normal, like a rash, or you develop an allergy to your medicine.  Your pain medicine does not help. Get help right away if:  You have a fever and your symptoms  get worse suddenly.  You have very bad pain in your belly (abdomen).  You are short of breath.  You faint.  You have pain, swelling, or redness of your leg.  You bleed a lot from your vagina and you see blood clots. Summary  It is normal to have some pain, tiredness, and fluid that comes from your vagina.  Do not take baths, swim, or use a hot tub. Ask your doctor about taking showers or sponge baths.  Do not lift anything that is heavier than 10 lb (4.5 kg), or the limit that you are told.  Follow your doctor's advice about exercise, driving, and general activities.  Try to have a responsible adult at home with you for the first 1-2 weeks to help with your daily chores. This information is not intended to replace advice given to you by your health care provider. Make sure you discuss any questions you have with your health care provider. Document Revised: 04/03/2020 Document Reviewed: 04/03/2020 Elsevier Patient Education  2021 [employer]  

## 2020-10-09 NOTE — Discharge Summary (Signed)
Gynecology Physician Postoperative Discharge Summary  Patient ID: Cheryl Holloway MRN: [id number] DOB/AGE: Oct 13, 1975 45 y.o.  Admit Date: 10/07/2020 Discharge Date: 10/09/2020  Preoperative Diagnoses: AUB and uterine fibroids, previous cesarean section  Procedures: Procedure(s) (LRB): CYSTOSCOPY (N/A) HYSTERECTOMY SUPRACERVICAL ABDOMINAL (N/A) LAPAROSCOPY DIAGNOSTIC (N/A) UNILATERAL SALPINGECTOMY (Right)  Hospital Course:  Cheryl Holloway is a 45 y.o. [id number]  admitted for scheduled surgery. Planned LAVH was converted to open procedure due to adhesion.She underwent the procedures as mentioned above, her operation was uncomplicated. For further details about surgery, please refer to the operative report. Patient had an uncomplicated postoperative course. By time of discharge on POD#2, her pain was controlled on oral pain medications; she was ambulating, voiding without difficulty, tolerating regular diet.Cheryl Holloway She was deemed stable for discharge to home.   Significant Labs: CBC Latest Ref Rng & Units 10/08/2020 10/07/2020 10/02/2020  WBC 4.0 - 10.5 K/uL 6.9 8.2 4.7  Hemoglobin 12.0 - 15.0 g/dL 8.6(L) 9.6(L) 12.2  Hematocrit 36.0 - 46.0 % 27.5(L) 29.4(L) 39.4  Platelets 150 - 400 K/uL 196 177 178    Discharge Exam: Blood pressure 126/77, pulse 82, temperature 99 F (37.2 C), temperature source Oral, resp. rate 17, height 5\' 5"  (1.651 m), weight 99.8 kg, last menstrual period 04/02/2020, SpO2 99 %. General appearance: alert and no distress  Resp: clear to auscultation bilaterally  Cardio: regular rate and rhythm  GI: soft, non-tender; bowel sounds normal; no masses, no organomegaly.  Incision: C/D/I, no erythema, no drainage noted Pelvic: scant blood on pad  Extremities: extremities normal, atraumatic, no cyanosis or edema and Homans sign is negative, no sign of DVT  Discharged Condition: Stable  Disposition: Discharge disposition: 01-Home or Self Care       Discharge Instructions     Discharge patient   Complete by: As directed    Discharge disposition: 01-Home or Self Care   Discharge patient date: 10/09/2020     Allergies as of 10/09/2020      Reactions   Ibuprofen Hives      Medication List    STOP taking these medications   hydrOXYzine 25 MG tablet Commonly known as: ATARAX/VISTARIL   methocarbamol 500 MG tablet Commonly known as: ROBAXIN   multivitamin with minerals Tabs tablet   naproxen sodium 220 MG tablet Commonly known as: ALEVE   tranexamic acid 650 MG Tabs tablet Commonly known as: LYSTEDA     TAKE these medications   oxyCODONE 5 MG immediate release tablet Commonly known as: Oxy IR/ROXICODONE Take 1-2 tablets (5-10 mg total) by mouth every 4 (four) hours as needed for moderate pain.       Follow-up [facility] at [facility] Follow up in 4 week(s).   Specialty: Obstetrics and Gynecology Why: remove dressing in 3 days at home Contact information: [place] [id number] [phone]              Signed: Woodroe Mode, MD, [place], [place]

## 2020-11-06 ENCOUNTER — Encounter: Payer: Self-pay | Admitting: Obstetrics & Gynecology

## 2020-11-06 ENCOUNTER — Other Ambulatory Visit: Payer: Self-pay

## 2020-11-06 ENCOUNTER — Ambulatory Visit (INDEPENDENT_AMBULATORY_CARE_PROVIDER_SITE_OTHER): Payer: Self-pay | Admitting: Obstetrics & Gynecology

## 2020-11-06 VITALS — BP 135/86 | HR 103

## 2020-11-06 DIAGNOSIS — Z9889 Other specified postprocedural states: Secondary | ICD-10-CM

## 2020-11-06 NOTE — Progress Notes (Signed)
Patient is here for a follow up from hysterectomy

## 2020-11-06 NOTE — Progress Notes (Signed)
Subjective:     Cheryl Holloway is a 45 y.o. female who presents to the clinic 4 weeks status post supracervical hysterectomy for abnormal uterine bleeding and fibroids. Eating a regular diet without difficulty. Bowel movements are normal. The patient is not having any pain.  The following portions of the patient's history were reviewed and updated as appropriate: allergies, current medications, past family history, past medical history, past social history, past surgical history and problem list.  Review of Systems Pertinent items are noted in HPI.    Objective:    BP 135/86   Pulse (!) 103   LMP 04/07/2020 (Approximate)  General:  alert, cooperative and no distress  Abdomen: soft, bowel sounds active, non-tender  Incision:   healing well, no drainage, no erythema, no hernia, no seroma, no swelling, no dehiscence, incision well approximated     Assessment:    Doing well postoperatively. Operative findings again reviewed. Pathology report discussed.    Plan:    1. Continue any current medications. 2. Wound care discussed. 3. Activity restrictions: no lifting more than 15 pounds 4. Anticipated return to work: 1-2 weeks. 5. Follow up: prn  . Patient ID: Cheryl Holloway, female   DOB: [birth year], 45 y.o.   MRN: [id number]

## 2020-11-06 NOTE — Patient Instructions (Signed)
Abdominal Hysterectomy, Care After The following information offers guidance on how to care for yourself after your procedure. Your doctor may also give you more specific instructions. If you have problems or questions, contact your doctor. What can I expect after the procedure? After the procedure, it is common to have:  Pain.  Tiredness.  No desire to eat.  Less interest in sex.  Bleeding and fluid (discharge) from your vagina. You may need to use a pad after this procedure.  Trouble pooping (constipation).  Feelings of sadness or other emotions. Follow these instructions at home: Medicines  Take over-the-counter and prescription medicines only as told by your doctor.  Do not take aspirin or NSAIDs, such as ibuprofen. These medicines can cause bleeding.  If you were prescribed an antibiotic medicine, take it as told by your doctor. Do not stop using the antibiotic even if you start to feel better.  If told, take steps to prevent problems with pooping (constipation). You may need to: ? Drink enough fluid to keep your pee (urine) pale yellow. ? Take medicines. You will be told what medicines to take. ? Eat foods that are high in fiber. These include beans, whole grains, and fresh fruits and vegetables. ? Limit foods that are high in fat and sugar. These include fried or sweet foods.  Ask your doctor if you should avoid driving or using machines while you are taking your medicine. Surgical cut care  Follow instructions from your doctor about how to take care of your cut from surgery (incision). Make sure you: ? Wash your hands with soap and water for at least 20 seconds before and after you change your bandage. If you cannot use soap and water, use hand sanitizer. ? Change your bandage. ? Leave stitches or skin glue in place for at least two weeks. ? Leave tape strips alone unless you are told to take them off. You may trim the edges of the tape strips if they curl up.  Keep  the bandage dry until your doctor says it can be taken off.  Check your incision every day for signs of infection. Check for: ? More redness, swelling, or pain. ? Fluid or blood. ? Warmth. ? Pus or a bad smell.   Activity  Rest as told by your doctor.  Get up to take short walks every 1 to 2 hours. Ask for help if you feel weak or unsteady.  Do not lift anything that is heavier than 10 lb (4.5 kg), or the limit that you are told.  Follow your doctor's advice about exercise, driving, and general activities.  Return to your normal activities when your doctor says that it is safe.   Lifestyle  Do not douche, use tampons, or have sex for at least 6 weeks or as told by your doctor.  Do not drink alcohol until your doctor says it is okay.  Do not smoke or use any products that contain nicotine or tobacco. These can delay healing after surgery. If you need help quitting, ask your doctor. General instructions  Do not take baths, swim, or use a hot tub. Ask your doctor about taking showers or sponge baths.  Try to have a responsible adult at home with you for the first 1-2 weeks to help with your daily chores.  Wear tight-fitting (compression) stockings as told by your doctor.  Keep all follow-up visits. Contact a doctor if:  You have chills or a fever.  You have any of these signs  of infection around your cut: ? More redness, swelling, or pain. ? Fluid or blood. ? Warmth. ? Pus or a bad smell.  Your cut breaks open.  You feel dizzy or light-headed.  You have pain or bleeding when you pee.  You keep having watery poop (diarrhea).  You keep feeling like you may vomit or you keep vomiting.  You have fluid coming from your vagina that is not normal.  You have any type of reaction to your medicine that is not normal, like a rash, or you develop an allergy to your medicine.  Your pain medicine does not help. Get help right away if:  You have a fever and your symptoms  get worse suddenly.  You have very bad pain in your belly (abdomen).  You are short of breath.  You faint.  You have pain, swelling, or redness of your leg.  You bleed a lot from your vagina and you see blood clots. Summary  It is normal to have some pain, tiredness, and fluid that comes from your vagina.  Do not take baths, swim, or use a hot tub. Ask your doctor about taking showers or sponge baths.  Do not lift anything that is heavier than 10 lb (4.5 kg), or the limit that you are told.  Follow your doctor's advice about exercise, driving, and general activities.  Try to have a responsible adult at home with you for the first 1-2 weeks to help with your daily chores. This information is not intended to replace advice given to you by your health care provider. Make sure you discuss any questions you have with your health care provider. Document Revised: 04/03/2020 Document Reviewed: 04/03/2020 Elsevier Patient Education  2021 [employer].

## 2020-12-01 ENCOUNTER — Telehealth: Payer: Self-pay | Admitting: *Deleted

## 2020-12-01 NOTE — Telephone Encounter (Signed)
Received a voicemail from Cheryl Holloway at [facility] because they sent a request for records . Would like a call back reference number [id number]. Slyvester Latona,RN

## 2021-04-06 ENCOUNTER — Encounter (HOSPITAL_COMMUNITY): Payer: Self-pay

## 2021-04-06 ENCOUNTER — Emergency Department (HOSPITAL_COMMUNITY): Payer: No Typology Code available for payment source

## 2021-04-06 ENCOUNTER — Emergency Department (HOSPITAL_COMMUNITY)
Admission: EM | Admit: 2021-04-06 | Discharge: 2021-04-06 | Disposition: A | Discharge status: Home / Self Care | Payer: No Typology Code available for payment source | Attending: Emergency Medicine | Admitting: Emergency Medicine

## 2021-04-06 ENCOUNTER — Other Ambulatory Visit: Payer: Self-pay

## 2021-04-06 DIAGNOSIS — S82851A Displaced trimalleolar fracture of right lower leg, initial encounter for closed fracture: Secondary | ICD-10-CM

## 2021-04-06 DIAGNOSIS — X58XXXA Exposure to other specified factors, initial encounter: Secondary | ICD-10-CM | POA: Insufficient documentation

## 2021-04-06 DIAGNOSIS — Z853 Personal history of malignant neoplasm of breast: Secondary | ICD-10-CM | POA: Insufficient documentation

## 2021-04-06 DIAGNOSIS — M7989 Other specified soft tissue disorders: Secondary | ICD-10-CM | POA: Insufficient documentation

## 2021-04-06 DIAGNOSIS — Y9389 Activity, other specified: Secondary | ICD-10-CM | POA: Insufficient documentation

## 2021-04-06 MED ORDER — HYDROCODONE-ACETAMINOPHEN 5-325 MG PO TABS
2.0000 | ORAL_TABLET | Freq: Once | ORAL | Status: AC
  Administered 2021-04-06: 2 via ORAL
  Filled 2021-04-06: qty 2

## 2021-04-06 MED ORDER — HYDROCODONE-ACETAMINOPHEN 5-325 MG PO TABS: 1.0000 | ORAL_TABLET | ORAL | 0 refills | Status: AC | PRN

## 2021-04-06 NOTE — Discharge Instructions (Addendum)
You have been seen in the ED for a right sided trimalleolar fracture (ankle). You need to follow up with the orthopedic doctor who should call you in the next 3 days to set up an appointment. You have been prescribed a pain medication. Please do not take this prior to driving. Please follow up if you develop worsening symptoms. Thank you for allowing [place] to be a part of your care.

## 2021-04-06 NOTE — ED Notes (Signed)
Ortho at [facility]

## 2021-04-06 NOTE — ED Triage Notes (Signed)
Patient was drug down by her dog this am and heard pop in ankle foot. Pain with any ambulation

## 2021-04-06 NOTE — ED Provider Notes (Signed)
[facility] EMERGENCY DEPARTMENT Provider Note   CSN: [id number] Arrival date & time: 04/06/21  1021     History No chief complaint on file.   Cheryl Holloway is a 45 y.o. female with a PMH of breast cancer, fatty liver, gastric bypass who presented to the ED for evaluation of her right ankle.  Patient states that she was playing with her dog this morning and he pulled her down.  She landed on her right ankle and heard a pop.  She has not been able to ambulate. Swelling is present. She does have some tingling in her right toes, but no numbness.          Past Medical History:  Diagnosis Date   Anemia    takes iron   Breast cancer ([place])    Fibroids    uterine   History of kidney stones 1994    Patient Active Problem List   Diagnosis Date Noted   Sepsis ([place]) 03/27/2016   Fatty liver 01/01/2016   UGIB (upper gastrointestinal bleed) 01/01/2016   Malignant neoplasm of right female breast ([place]) 09/30/2015   Unintentional weight loss 09/30/2015   S/P mastectomy 04/25/2014   Breast cancer, right ([place]) 01/27/2014   Gastric bypass status for obesity 01/27/2014   Iron deficiency anemia due to chronic blood loss 01/27/2014    Past Surgical History:  Procedure Laterality Date   BREAST IMPLANT REMOVAL Right 04/25/2014   Procedure: REMOVAL BREAST IMPLANT WITH CAPSULECTOMY;  Surgeon: Theodoro Kos, DO;  Location: [facility];  Service: Plastics;  Laterality: Right;   BREAST RECONSTRUCTION     BREAST REDUCTION SURGERY     CESAREAN SECTION     x3   CYSTOSCOPY N/A 10/07/2020   Procedure: CYSTOSCOPY;  Surgeon: Woodroe Mode, MD;  Location: [facility];  Service: Gynecology;  Laterality: N/A;   DILATION AND CURETTAGE OF UTERUS  1993   ESOPHAGOGASTRODUODENOSCOPY N/A 01/02/2016   Procedure: ESOPHAGOGASTRODUODENOSCOPY (EGD);  Surgeon: Clarene Essex, MD;  Location: [facility] ENDOSCOPY;  Service: Endoscopy;  Laterality: N/A;   GASTRIC BYPASS     LAPAROSCOPY N/A 10/07/2020    Procedure: LAPAROSCOPY DIAGNOSTIC;  Surgeon: Woodroe Mode, MD;  Location: [facility];  Service: Gynecology;  Laterality: N/A;   MASTECTOMY     right   SIMPLE MASTECTOMY WITH AXILLARY SENTINEL NODE BIOPSY Left 04/25/2014   Procedure: LEFT TOTAL  MASTECTOMY (PROPHYLACTIC);  Surgeon: Rolm Bookbinder, MD;  Location: [facility];  Service: General;  Laterality: Left;   SUPRACERVICAL ABDOMINAL HYSTERECTOMY N/A 10/07/2020   Procedure: HYSTERECTOMY SUPRACERVICAL ABDOMINAL;  Surgeon: Woodroe Mode, MD;  Location: [place];  Service: Gynecology;  Laterality: N/A;   TUBAL LIGATION     UNILATERAL SALPINGECTOMY Right 10/07/2020   Procedure: UNILATERAL SALPINGECTOMY;  Surgeon: Woodroe Mode, MD;  Location: [facility];  Service: Gynecology;  Laterality: Right;     OB History     Gravida  4   Para  3   Term  3   Preterm      AB  1   Living  3      SAB  1   IAB      Ectopic      Multiple      Live Births  3           Family History  Problem Relation Age of Onset   Healthy Mother     Social History   Tobacco Use   Smoking status: Never  Smokeless tobacco: Never  Vaping Use   Vaping Use: Never used  Substance Use Topics   Alcohol use: Not Currently    Comment: social   Drug use: No    Home Medications Prior to Admission medications   Medication Sig Start Date End Date Taking? Authorizing Provider  Fiber Adult Gummies 2 g CHEW Chew 1 tablet by mouth daily.   Yes [provider]  HYDROcodone-acetaminophen (NORCO/VICODIN) 5-325 MG tablet Take 1-2 tablets by mouth every 4 (four) hours as needed for up to 3 days for moderate pain or severe pain. 04/06/21 04/09/21 Yes Rickia [place], Adora Fridge, PA-C  Multiple Vitamin (MULTIVITAMIN ADULT PO) Take 1 tablet by mouth daily.   Yes [provider]  oxyCODONE (OXY IR/ROXICODONE) 5 MG immediate release tablet Take 1-2 tablets (5-10 mg total) by mouth every 4 (four) hours as needed for moderate pain. Patient not  taking: Reported on 11/06/2020 10/09/20   Woodroe Mode, MD    Allergies    Ibuprofen  Review of Systems   Review of Systems  Musculoskeletal:  Positive for gait problem and joint swelling.  Skin:  Negative for color change and wound.  Neurological:  Negative for numbness.  All other systems reviewed and are negative.  Physical Exam Updated Vital Signs BP (!) 151/93 (BP Location: Left Arm)   Pulse 66   Temp 98.1 F (36.7 C) (Oral)   Resp 16   SpO2 100%   Physical Exam Vitals and nursing note reviewed.  Constitutional:      General: She is not in acute distress.    Appearance: Normal appearance.  HENT:     Head: Normocephalic and atraumatic.  Eyes:     General: No scleral icterus.    Conjunctiva/sclera: Conjunctivae normal.  Musculoskeletal:        General: Swelling, tenderness and signs of injury present. No deformity.     Right lower leg: Normal.     Left lower leg: Normal.     Right ankle: Swelling present. No deformity, ecchymosis or lacerations. Tenderness present over the lateral malleolus. No medial malleolus tenderness. Decreased range of motion. Normal pulse.     Right foot: Swelling present.     Left foot: Normal.     Comments: Swelling of the right ankle. Distal pulses intact. Sensation intact.   Skin:    General: Skin is warm and dry.     Findings: No bruising.  Neurological:     Mental Status: She is alert and oriented to person, place, and time.     Sensory: Sensation is intact.  Psychiatric:        Mood and Affect: Mood normal.        Behavior: Behavior normal.    ED Results / Procedures / Treatments   Labs (all labs ordered are listed, but only abnormal results are displayed) Labs Reviewed - No data to display  EKG None  Radiology DG Ankle Complete Right  Result Date: 04/06/2021 CLINICAL DATA:  Right foot and ankle pain after fall today. EXAM: RIGHT FOOT COMPLETE - 3+ VIEW; RIGHT ANKLE - COMPLETE 3+ VIEW COMPARISON:  None. FINDINGS: Acute  nondisplaced transverse fracture through the medial malleolus. Acute mildly displaced spiral fracture of the distal fibular metaphysis with 8 mm posterior displacement. Acute small avulsion fracture of the posterior malleolus. Widening of the medial clear space and lateral subluxation of the talus with respect to the tibial plafond. No mid or forefoot fracture. Lisfranc alignment is maintained. No dislocation. Joint spaces are  preserved. Prominent bony hypertrophy of the navicular medially. Diffuse soft tissue swelling. IMPRESSION: 1. Acute trimalleolar fractures with evidence of instability. 2. No mid or forefoot fracture. Electronically Signed   By: Titus Dubin M.D.   On: 04/06/2021 11:43   DG Foot Complete Right  Result Date: 04/06/2021 CLINICAL DATA:  Right foot and ankle pain after fall today. EXAM: RIGHT FOOT COMPLETE - 3+ VIEW; RIGHT ANKLE - COMPLETE 3+ VIEW COMPARISON:  None. FINDINGS: Acute nondisplaced transverse fracture through the medial malleolus. Acute mildly displaced spiral fracture of the distal fibular metaphysis with 8 mm posterior displacement. Acute small avulsion fracture of the posterior malleolus. Widening of the medial clear space and lateral subluxation of the talus with respect to the tibial plafond. No mid or forefoot fracture. Lisfranc alignment is maintained. No dislocation. Joint spaces are preserved. Prominent bony hypertrophy of the navicular medially. Diffuse soft tissue swelling. IMPRESSION: 1. Acute trimalleolar fractures with evidence of instability. 2. No mid or forefoot fracture. Electronically Signed   By: Titus Dubin M.D.   On: 04/06/2021 11:43    Procedures Procedures   Medications Ordered in ED Medications  HYDROcodone-acetaminophen (NORCO/VICODIN) 5-325 MG per tablet 2 tablet (2 tablets Oral Given 04/06/21 1359)    ED Course  I have reviewed the triage vital signs and the nursing notes.  Pertinent labs & imaging results that were available during  my care of the patient were reviewed by me and considered in my medical decision making (see chart for details).  Clinical Course as of 04/06/21 1518  Mon Apr 06, 2021  1408 Imaging reviewed. Appears to be unstable right trimalleolar fracture. Ortho consulted and at bedside to see patient.  [GL]    Clinical Course User Index [GL] Arrington Yohe, Adora Fridge, PA-C   MDM Rules/Calculators/A&P                         This is a well appearing 45 y.o. female who suffered a right trimalleolar fracture after being pulled down by her dog. She has no evidence of neurovascular injury. The fracture is nondisplaced but does show evidence of instability. Ortho saw patient in ED. She will be splinted with a short leg splint and she is set up with orthopedic surgery this week for probable surgery. She will be sent home with 3 day supply of norco.   Final Clinical Impression(s) / ED Diagnoses Final diagnoses:  Closed right trimalleolar fracture, initial encounter    Rx / DC Orders ED Discharge Orders          Ordered    HYDROcodone-acetaminophen (NORCO/VICODIN) 5-325 MG tablet  Every 4 hours PRN        04/06/21 1418             Adolphus Birchwood, PA-C 04/06/21 1518    Tegeler, Gwenyth Allegra, MD 04/06/21 [phone]

## 2021-04-06 NOTE — ED Notes (Signed)
Splinting in progress

## 2021-04-06 NOTE — ED Notes (Addendum)
EDPA and orthotech at [facility].  R ankle/foot iced and elevated.

## 2021-04-06 NOTE — Consult Note (Signed)
Reason for Consult:Right ankle fx Referring Physician: Gerald Stabs Tegeler Time called: [id number] Time at bedside: [place] is an 45 y.o. female.  HPI: Cheryl Holloway was walking her dog when he pulled her off balance and dragged her. She had immediate right ankle pain and could not bear weight. She came to the ED where x-rays showed an ankle fx and orthopedic surgery was consulted. She works as an Print production planner.  Past Medical History:  Diagnosis Date   Anemia    takes iron   Breast cancer ([place])    Fibroids    uterine   History of kidney stones 1994    Past Surgical History:  Procedure Laterality Date   BREAST IMPLANT REMOVAL Right 04/25/2014   Procedure: REMOVAL BREAST IMPLANT WITH CAPSULECTOMY;  Surgeon: Theodoro Kos, DO;  Location: [facility];  Service: Plastics;  Laterality: Right;   BREAST RECONSTRUCTION     BREAST REDUCTION SURGERY     CESAREAN SECTION     x3   CYSTOSCOPY N/A 10/07/2020   Procedure: CYSTOSCOPY;  Surgeon: Woodroe Mode, MD;  Location: [facility];  Service: Gynecology;  Laterality: N/A;   DILATION AND CURETTAGE OF UTERUS  1993   ESOPHAGOGASTRODUODENOSCOPY N/A 01/02/2016   Procedure: ESOPHAGOGASTRODUODENOSCOPY (EGD);  Surgeon: Clarene Essex, MD;  Location: [facility] ENDOSCOPY;  Service: Endoscopy;  Laterality: N/A;   GASTRIC BYPASS     LAPAROSCOPY N/A 10/07/2020   Procedure: LAPAROSCOPY DIAGNOSTIC;  Surgeon: Woodroe Mode, MD;  Location: [facility];  Service: Gynecology;  Laterality: N/A;   MASTECTOMY     right   SIMPLE MASTECTOMY WITH AXILLARY SENTINEL NODE BIOPSY Left 04/25/2014   Procedure: LEFT TOTAL  MASTECTOMY (PROPHYLACTIC);  Surgeon: Rolm Bookbinder, MD;  Location: [facility];  Service: General;  Laterality: Left;   SUPRACERVICAL ABDOMINAL HYSTERECTOMY N/A 10/07/2020   Procedure: HYSTERECTOMY SUPRACERVICAL ABDOMINAL;  Surgeon: Woodroe Mode, MD;  Location: [facility];  Service: Gynecology;  Laterality: N/A;   TUBAL LIGATION     UNILATERAL  SALPINGECTOMY Right 10/07/2020   Procedure: UNILATERAL SALPINGECTOMY;  Surgeon: Woodroe Mode, MD;  Location: [facility];  Service: Gynecology;  Laterality: Right;    Family History  Problem Relation Age of Onset   Healthy Mother     Social History:  reports that she has never smoked. She has never used smokeless tobacco. She reports that she does not currently use alcohol. She reports that she does not use drugs.  Allergies:  Allergies  Allergen Reactions   Ibuprofen Hives    Medications: I have reviewed the patient's current medications.  No results found for this or any previous visit (from the past 48 hour(s)).  DG Ankle Complete Right  Result Date: 04/06/2021 CLINICAL DATA:  Right foot and ankle pain after fall today. EXAM: RIGHT FOOT COMPLETE - 3+ VIEW; RIGHT ANKLE - COMPLETE 3+ VIEW COMPARISON:  None. FINDINGS: Acute nondisplaced transverse fracture through the medial malleolus. Acute mildly displaced spiral fracture of the distal fibular metaphysis with 8 mm posterior displacement. Acute small avulsion fracture of the posterior malleolus. Widening of the medial clear space and lateral subluxation of the talus with respect to the tibial plafond. No mid or forefoot fracture. Lisfranc alignment is maintained. No dislocation. Joint spaces are preserved. Prominent bony hypertrophy of the navicular medially. Diffuse soft tissue swelling. IMPRESSION: 1. Acute trimalleolar fractures with evidence of instability. 2. No mid or forefoot fracture. Electronically Signed   By: Titus Dubin M.D.   On: 04/06/2021  11:43   DG Foot Complete Right  Result Date: 04/06/2021 CLINICAL DATA:  Right foot and ankle pain after fall today. EXAM: RIGHT FOOT COMPLETE - 3+ VIEW; RIGHT ANKLE - COMPLETE 3+ VIEW COMPARISON:  None. FINDINGS: Acute nondisplaced transverse fracture through the medial malleolus. Acute mildly displaced spiral fracture of the distal fibular metaphysis with 8 mm posterior displacement.  Acute small avulsion fracture of the posterior malleolus. Widening of the medial clear space and lateral subluxation of the talus with respect to the tibial plafond. No mid or forefoot fracture. Lisfranc alignment is maintained. No dislocation. Joint spaces are preserved. Prominent bony hypertrophy of the navicular medially. Diffuse soft tissue swelling. IMPRESSION: 1. Acute trimalleolar fractures with evidence of instability. 2. No mid or forefoot fracture. Electronically Signed   By: Titus Dubin M.D.   On: 04/06/2021 11:43    Review of Systems  HENT:  Negative for ear discharge, ear pain, hearing loss and tinnitus.   Eyes:  Negative for photophobia and pain.  Respiratory:  Negative for cough and shortness of breath.   Cardiovascular:  Negative for chest pain.  Gastrointestinal:  Negative for abdominal pain, nausea and vomiting.  Genitourinary:  Negative for dysuria, flank pain, frequency and urgency.  Musculoskeletal:  Positive for arthralgias (Right ankle). Negative for back pain, myalgias and neck pain.  Neurological:  Negative for dizziness and headaches.  Hematological:  Does not bruise/bleed easily.  Psychiatric/Behavioral:  The patient is not nervous/anxious.   Blood pressure (!) 151/93, pulse 66, temperature 98.1 F (36.7 C), temperature source Oral, resp. rate 16, SpO2 100 %. Physical Exam Constitutional:      General: She is not in acute distress.    Appearance: She is well-developed. She is not diaphoretic.  HENT:     Head: Normocephalic and atraumatic.  Eyes:     General: No scleral icterus.       Right eye: No discharge.        Left eye: No discharge.     Conjunctiva/sclera: Conjunctivae normal.  Cardiovascular:     Rate and Rhythm: Normal rate and regular rhythm.  Pulmonary:     Effort: Pulmonary effort is normal. No respiratory distress.  Musculoskeletal:     Cervical back: Normal range of motion.     Comments: RLE No traumatic wounds, ecchymosis, or rash  Mod  TTP/edema ankle  No knee effusion  Knee stable to varus/ valgus and anterior/posterior stress  Sens DPN, SPN, TN intact  Motor EHL 5/5  DP 2+, PT 1+, No significant pedal edema  Skin:    General: Skin is warm and dry.  Neurological:     Mental Status: She is alert.  Psychiatric:        Mood and Affect: Mood normal.        Behavior: Behavior normal.    Assessment/Plan: Right ankle fx -- Will need ORIF on delayed basis. Splint, crutches, and NWB for now. Ice and elevation at home. F/u with Dr. Stann Mainland later this week.    Lisette Abu, PA-C Orthopedic Surgery [phone] 04/06/2021, 2:05 PM

## 2021-04-10 ENCOUNTER — Other Ambulatory Visit: Payer: Self-pay

## 2021-04-10 ENCOUNTER — Encounter (HOSPITAL_COMMUNITY): Payer: Self-pay | Admitting: Orthopedic Surgery

## 2021-04-10 NOTE — Progress Notes (Signed)
Spoke with pt for pre-op call. Pt denies cardiac history, HTN or diabetes  Pt's surgery is scheduled as ambulatory so no Covid test is required prior to surgery.

## 2021-04-14 ENCOUNTER — Encounter (HOSPITAL_COMMUNITY): Payer: Self-pay | Admitting: Orthopedic Surgery

## 2021-04-14 ENCOUNTER — Ambulatory Visit (HOSPITAL_COMMUNITY)
Admission: RE | Admit: 2021-04-14 | Discharge: 2021-04-14 | Disposition: A | Discharge status: Home / Self Care | Payer: Self-pay | Attending: Orthopedic Surgery | Admitting: Orthopedic Surgery

## 2021-04-14 ENCOUNTER — Encounter (HOSPITAL_COMMUNITY)
Admission: RE | Disposition: A | Discharge status: Home / Self Care | Payer: Self-pay | Source: Home / Self Care | Attending: Orthopedic Surgery

## 2021-04-14 ENCOUNTER — Other Ambulatory Visit: Payer: Self-pay

## 2021-04-14 ENCOUNTER — Ambulatory Visit (HOSPITAL_COMMUNITY): Payer: Self-pay

## 2021-04-14 ENCOUNTER — Ambulatory Visit (HOSPITAL_COMMUNITY): Payer: Self-pay | Admitting: Anesthesiology

## 2021-04-14 DIAGNOSIS — Z886 Allergy status to analgesic agent status: Secondary | ICD-10-CM | POA: Insufficient documentation

## 2021-04-14 DIAGNOSIS — S82851A Displaced trimalleolar fracture of right lower leg, initial encounter for closed fracture: Secondary | ICD-10-CM | POA: Insufficient documentation

## 2021-04-14 DIAGNOSIS — Z9884 Bariatric surgery status: Secondary | ICD-10-CM | POA: Insufficient documentation

## 2021-04-14 DIAGNOSIS — Z9013 Acquired absence of bilateral breasts and nipples: Secondary | ICD-10-CM | POA: Insufficient documentation

## 2021-04-14 DIAGNOSIS — S93431A Sprain of tibiofibular ligament of right ankle, initial encounter: Secondary | ICD-10-CM | POA: Insufficient documentation

## 2021-04-14 DIAGNOSIS — Z419 Encounter for procedure for purposes other than remedying health state, unspecified: Secondary | ICD-10-CM

## 2021-04-14 DIAGNOSIS — Z853 Personal history of malignant neoplasm of breast: Secondary | ICD-10-CM | POA: Insufficient documentation

## 2021-04-14 DIAGNOSIS — W19XXXA Unspecified fall, initial encounter: Secondary | ICD-10-CM | POA: Insufficient documentation

## 2021-04-14 HISTORY — PX: ORIF ANKLE FRACTURE: SHX5408

## 2021-04-14 LAB — CBC
HCT: 35.9 % — ABNORMAL LOW (ref 36.0–46.0)
Hemoglobin: 10.9 g/dL — ABNORMAL LOW (ref 12.0–15.0)
MCH: 25.6 pg — ABNORMAL LOW (ref 26.0–34.0)
MCHC: 30.4 g/dL (ref 30.0–36.0)
MCV: 84.5 fL (ref 80.0–100.0)
Platelets: 245 10*3/uL (ref 150–400)
RBC: 4.25 MIL/uL (ref 3.87–5.11)
RDW: 17.4 % — ABNORMAL HIGH (ref 11.5–15.5)
WBC: 5.5 10*3/uL (ref 4.0–10.5)
nRBC: 0 % (ref 0.0–0.2)

## 2021-04-14 LAB — SURGICAL PCR SCREEN
MRSA, PCR: NEGATIVE
Staphylococcus aureus: NEGATIVE

## 2021-04-14 SURGERY — OPEN REDUCTION INTERNAL FIXATION (ORIF) ANKLE FRACTURE
Anesthesia: Regional | Site: Ankle | Laterality: Right

## 2021-04-14 MED ORDER — DEXMEDETOMIDINE (PRECEDEX) IN NS 20 MCG/5ML (4 MCG/ML) IV SYRINGE
PREFILLED_SYRINGE | INTRAVENOUS | Status: DC | PRN
  Administered 2021-04-14: 20 ug via INTRAVENOUS

## 2021-04-14 MED ORDER — CHLORHEXIDINE GLUCONATE 0.12 % MT SOLN
OROMUCOSAL | Status: AC
  Administered 2021-04-14: 15 mL via OROMUCOSAL
  Filled 2021-04-14: qty 15

## 2021-04-14 MED ORDER — 0.9 % SODIUM CHLORIDE (POUR BTL) OPTIME
TOPICAL | Status: DC | PRN
  Administered 2021-04-14: 1000 mL

## 2021-04-14 MED ORDER — ACETAMINOPHEN 10 MG/ML IV SOLN: 1000.0000 mg | Freq: Once | INTRAVENOUS | Status: DC | PRN

## 2021-04-14 MED ORDER — PROPOFOL 500 MG/50ML IV EMUL
INTRAVENOUS | Status: DC | PRN
  Administered 2021-04-14: 50 ug/kg/min via INTRAVENOUS

## 2021-04-14 MED ORDER — LIDOCAINE 2% (20 MG/ML) 5 ML SYRINGE
INTRAMUSCULAR | Status: DC | PRN
  Administered 2021-04-14: 100 mg via INTRAVENOUS

## 2021-04-14 MED ORDER — OXYCODONE HCL 5 MG PO TABS
5.0000 mg | ORAL_TABLET | Freq: Once | ORAL | Status: DC | PRN
Start: 2021-04-14 — End: 2021-04-14

## 2021-04-14 MED ORDER — ONDANSETRON 4 MG PO TBDP: 4.0000 mg | ORAL_TABLET | Freq: Three times a day (TID) | ORAL | 0 refills | Status: DC | PRN

## 2021-04-14 MED ORDER — CEFAZOLIN SODIUM-DEXTROSE 2-4 GM/100ML-% IV SOLN
INTRAVENOUS | Status: AC
  Filled 2021-04-14: qty 100

## 2021-04-14 MED ORDER — CEFAZOLIN SODIUM-DEXTROSE 2-4 GM/100ML-% IV SOLN
2.0000 g | INTRAVENOUS | Status: AC
  Administered 2021-04-14: 2 g via INTRAVENOUS

## 2021-04-14 MED ORDER — MIDAZOLAM HCL 2 MG/2ML IJ SOLN: 2.0000 mg | Freq: Once | INTRAMUSCULAR | Status: AC

## 2021-04-14 MED ORDER — ORAL CARE MOUTH RINSE: 15.0000 mL | Freq: Once | OROMUCOSAL | Status: AC

## 2021-04-14 MED ORDER — CHLORHEXIDINE GLUCONATE 0.12 % MT SOLN: 15.0000 mL | Freq: Once | OROMUCOSAL | Status: AC

## 2021-04-14 MED ORDER — LACTATED RINGERS IV SOLN: INTRAVENOUS | Status: DC

## 2021-04-14 MED ORDER — PROPOFOL 10 MG/ML IV BOLUS
INTRAVENOUS | Status: DC | PRN
  Administered 2021-04-14: 50 mg via INTRAVENOUS

## 2021-04-14 MED ORDER — FENTANYL CITRATE (PF) 100 MCG/2ML IJ SOLN: 100.0000 ug | Freq: Once | INTRAMUSCULAR | Status: AC

## 2021-04-14 MED ORDER — FENTANYL CITRATE (PF) 100 MCG/2ML IJ SOLN
INTRAMUSCULAR | Status: AC
  Administered 2021-04-14: 100 ug via INTRAVENOUS
  Filled 2021-04-14: qty 2

## 2021-04-14 MED ORDER — OXYCODONE HCL 5 MG/5ML PO SOLN: 5.0000 mg | Freq: Once | ORAL | Status: DC | PRN

## 2021-04-14 MED ORDER — HYDROMORPHONE HCL 1 MG/ML IJ SOLN: 0.2500 mg | INTRAMUSCULAR | Status: DC | PRN

## 2021-04-14 MED ORDER — ONDANSETRON HCL 4 MG/2ML IJ SOLN: 4.0000 mg | Freq: Once | INTRAMUSCULAR | Status: DC | PRN

## 2021-04-14 MED ORDER — AMISULPRIDE (ANTIEMETIC) 5 MG/2ML IV SOLN: 10.0000 mg | Freq: Once | INTRAVENOUS | Status: DC | PRN

## 2021-04-14 MED ORDER — MIDAZOLAM HCL 2 MG/2ML IJ SOLN
INTRAMUSCULAR | Status: AC
  Administered 2021-04-14: 2 mg via INTRAVENOUS
  Filled 2021-04-14: qty 2

## 2021-04-14 MED ORDER — OXYCODONE HCL 5 MG PO TABS: 5.0000 mg | ORAL_TABLET | ORAL | 0 refills | Status: DC | PRN

## 2021-04-14 SURGICAL SUPPLY — 76 items
ALCOHOL 70% 16 OZ (MISCELLANEOUS) ×2 IMPLANT
BAG COUNTER SPONGE SURGICOUNT (BAG) ×2 IMPLANT
BANDAGE ESMARK 6X9 LF (GAUZE/BANDAGES/DRESSINGS) IMPLANT
BB-Tak Smooth ×2 IMPLANT
BIT DRILL 2 CANN GRADUATED (BIT) ×2 IMPLANT
BIT DRILL 2.5 CANN LNG (BIT) ×2 IMPLANT
BIT DRILL 2.6 CANN (BIT) ×2 IMPLANT
BIT DRILL 2.7 (BIT) ×1
BIT DRILL 2.7X2.7/3XSCR ANKL (BIT) ×1 IMPLANT
BIT DRL 2.7X2.7/3XSCR ANKL (BIT) ×1
BNDG COHESIVE 4X5 TAN STRL (GAUZE/BANDAGES/DRESSINGS) ×2 IMPLANT
BNDG ELASTIC 4X5.8 VLCR STR LF (GAUZE/BANDAGES/DRESSINGS) ×2 IMPLANT
BNDG ELASTIC 6X5.8 VLCR STR LF (GAUZE/BANDAGES/DRESSINGS) ×2 IMPLANT
BNDG ESMARK 6X9 LF (GAUZE/BANDAGES/DRESSINGS)
CANISTER SUCT 3000ML PPV (MISCELLANEOUS) ×2 IMPLANT
COVER SURGICAL LIGHT HANDLE (MISCELLANEOUS) ×2 IMPLANT
CUFF TOURN SGL QUICK 34 (TOURNIQUET CUFF)
CUFF TRNQT CYL 34X4.125X (TOURNIQUET CUFF) IMPLANT
DRAPE C-ARM 42X72 X-RAY (DRAPES) ×2 IMPLANT
DRAPE C-ARMOR (DRAPES) ×2 IMPLANT
DRAPE U-SHAPE 47X51 STRL (DRAPES) ×2 IMPLANT
DRSG ADAPTIC 3X8 NADH LF (GAUZE/BANDAGES/DRESSINGS) ×2 IMPLANT
DRSG PAD ABDOMINAL 8X10 ST (GAUZE/BANDAGES/DRESSINGS) ×4 IMPLANT
DURAPREP 26ML APPLICATOR (WOUND CARE) ×2 IMPLANT
ELECT REM PT RETURN 9FT ADLT (ELECTROSURGICAL) ×2
ELECTRODE REM PT RTRN 9FT ADLT (ELECTROSURGICAL) ×1 IMPLANT
GAUZE SPONGE 4X4 12PLY STRL (GAUZE/BANDAGES/DRESSINGS) ×2 IMPLANT
GLOVE SRG 8 PF TXTR STRL LF DI (GLOVE) ×2 IMPLANT
GLOVE SURG ENC MOIS LTX SZ7.5 (GLOVE) ×4 IMPLANT
GLOVE SURG UNDER POLY LF SZ8 (GLOVE) ×2
GOWN STRL REUS W/ TWL LRG LVL3 (GOWN DISPOSABLE) ×2 IMPLANT
GOWN STRL REUS W/ TWL XL LVL3 (GOWN DISPOSABLE) ×2 IMPLANT
GOWN STRL REUS W/TWL LRG LVL3 (GOWN DISPOSABLE) ×2
GOWN STRL REUS W/TWL XL LVL3 (GOWN DISPOSABLE) ×2
GUIDEWIRE 1.35MM (WIRE) ×2 IMPLANT
IMPL TIGHTROP W/DRV K-LESS (Anchor) ×1 IMPLANT
IMPLANT TIGHTROPE W/DRV K-LESS (Anchor) ×2 IMPLANT
K-WIRE BB-TAK (WIRE) ×2
KIT BASIN OR (CUSTOM PROCEDURE TRAY) ×2 IMPLANT
KIT TURNOVER KIT B (KITS) ×2 IMPLANT
KWIRE BB-TAK (WIRE) ×1 IMPLANT
NEEDLE HYPO 25GX1X1/2 BEV (NEEDLE) IMPLANT
NS IRRIG 1000ML POUR BTL (IV SOLUTION) ×2 IMPLANT
PACK ORTHO EXTREMITY (CUSTOM PROCEDURE TRAY) ×2 IMPLANT
PAD ARMBOARD 7.5X6 YLW CONV (MISCELLANEOUS) ×4 IMPLANT
PAD CAST 4YDX4 CTTN HI CHSV (CAST SUPPLIES) ×1 IMPLANT
PADDING CAST COTTON 4X4 STRL (CAST SUPPLIES) ×1
PADDING CAST COTTON 6X4 STRL (CAST SUPPLIES) ×2 IMPLANT
PLATE LOCK DIST FIB 5H RT (Plate) ×2 IMPLANT
SCREW LOCK T10 FT 18X2.7X (Screw) ×1 IMPLANT
SCREW LOCKING 2.7X10 ANKLE (Screw) ×2 IMPLANT
SCREW LOCKING 2.7X12 ANKLE (Screw) ×2 IMPLANT
SCREW LOCKING 2.7X16MM (Screw) ×2 IMPLANT
SCREW LOCKING 2.7X18MM (Screw) ×1 IMPLANT
SCREW LOW PROFILE 3.5X14 (Screw) ×6 IMPLANT
SCREW LOW PROFILE 4.0X40 (Screw) ×2 IMPLANT
SCREW NLOCK 26X2.7XLOPRFL (Screw) ×1 IMPLANT
SCREW NONLOCK 2.7X26MM (Screw) ×1 IMPLANT
SPLINT PLASTER CAST XFAST 5X30 (CAST SUPPLIES) ×1 IMPLANT
SPLINT PLASTER XFAST SET 5X30 (CAST SUPPLIES) ×1
SPONGE T-LAP 18X18 ~~LOC~~+RFID (SPONGE) IMPLANT
SUCTION FRAZIER HANDLE 10FR (MISCELLANEOUS) ×1
SUCTION TUBE FRAZIER 10FR DISP (MISCELLANEOUS) ×1 IMPLANT
SUT ETHILON 2 0 FS 18 (SUTURE) ×2 IMPLANT
SUT ETHILON 3 0 FSL (SUTURE) ×4 IMPLANT
SUT ETHILON 3 0 PS 1 (SUTURE) ×2 IMPLANT
SUT VIC AB 0 CT1 27 (SUTURE) ×1
SUT VIC AB 0 CT1 27XBRD ANBCTR (SUTURE) ×1 IMPLANT
SUT VIC AB 2-0 CT1 27 (SUTURE) ×2
SUT VIC AB 2-0 CT1 TAPERPNT 27 (SUTURE) ×2 IMPLANT
SYR CONTROL 10ML LL (SYRINGE) IMPLANT
TOWEL GREEN STERILE (TOWEL DISPOSABLE) ×4 IMPLANT
TUBE CONNECTING 12X1/4 (SUCTIONS) ×2 IMPLANT
WASHER (Orthopedic Implant) ×1 IMPLANT
WASHER ORTHO 7X (Orthopedic Implant) ×1 IMPLANT
YANKAUER SUCT BULB TIP NO VENT (SUCTIONS) ×2 IMPLANT

## 2021-04-14 NOTE — Anesthesia Postprocedure Evaluation (Signed)
Anesthesia Post Note  Patient: Cheryl Holloway  Procedure(s) Performed: OPEN REDUCTION INTERNAL FIXATION (ORIF) RIGHT ANKLE FRACTURE WITH SYNDESMOTIC FIXATION (Right: Ankle)     Patient location during evaluation: PACU Anesthesia Type: Regional Level of consciousness: awake and alert Pain management: pain level controlled Vital Signs Assessment: post-procedure vital signs reviewed and stable Respiratory status: spontaneous breathing, nonlabored ventilation, respiratory function stable and patient connected to nasal cannula oxygen Cardiovascular status: stable and blood pressure returned to baseline Postop Assessment: no apparent nausea or vomiting Anesthetic complications: no   No notable events documented.  Last Vitals:  Vitals:   04/14/21 1548 04/14/21 1603  BP: 120/73 107/70  Pulse: 73 62  Resp: 11 16  Temp: 36.9 C (!) 36.4 C  SpO2: 100% 100%    Last Pain:  Vitals:   04/14/21 1603  TempSrc:   PainSc: 0-No pain        RLE Motor Response: No movement due to regional block (04/14/21 1603) RLE Sensation: Numbness;Tingling (nerve block) (04/14/21 1603)      Barnet Glasgow

## 2021-04-14 NOTE — Op Note (Signed)
Date of Surgery: 04/14/2021  INDICATIONS: [name] is a 45 y.o.-year-old female who sustained a right trimalleolar ankle fracture; she was indicated for open reduction and internal fixation due to the displaced nature of the articular fracture and came to the operating room today for this procedure. The Patient did consent to the procedure after discussion of the risks and benefits.  PREOPERATIVE DIAGNOSIS: 1.  Right trimalleolar, closed ankle fracture  POSTOPERATIVE DIAGNOSIS:  1.  Right trimalleolar, closed ankle fracture  2.  Right ankle syndesmotic disruption  PROCEDURE:  Open treatment of right ankle fracture with internal fixation.Trimalleolar w/o fixation of posterior malleolus CPT 27822.  Right ankle open reduction and internal fixation of distal tibiofibular joint (syndesmotic fixation). Right ankle manual stress radiography independently interpreted by myself.  SURGEON: Ameya Kutz P. Stann Mainland, M.D.  ASSIST: Jonelle Sidle, PA-C  Assistant attestation:  PA Mcclung present for the entire procedure.  He participated in all critical portions.  He personally applied the postoperative splint.  ANESTHESIA: Regional anesthetic with conscious sedation  TOURNIQUET TIME: 75 minutes at 300 mmHg  IV FLUIDS AND URINE: See anesthesia.  ESTIMATED BLOOD LOSS: 30 mL.  IMPLANTS:  Arthrex stainless steel distal fibular 5 hole plate with distal locking screws and proximal nonlocking screws. Medial 4.0 mm cannulated screw Stainless steel tight rope implant device for syndesmotic fixation, Arthrex.  COMPLICATIONS: None.  DESCRIPTION OF PROCEDURE: The patient was brought to the operating room and placed supine on the operating table.  The patient had been signed prior to the procedure and this was documented. The patient had the anesthesia placed by the anesthesiologist.  A nonsterile tourniquet was placed on the upper thigh.  The prep verification and incision time-outs were performed to  confirm that this was the correct patient, site, side and location. The patient had an SCD on the opposite lower extremity. The patient did receive antibiotics prior to the incision and was re-dosed during the procedure as needed at indicated intervals.  The patient had the lower extremity prepped and draped in the standard surgical fashion.  The extremity was exsanguinated using an esmarch bandage and the tourniquet was inflated to 300 mm Hg.   Incision was made over the distal fibula and the fracture was exposed and reduced anatomically with a clamp. A lag screw was placed.  This produced anatomic alignment and good compression.   I then applied a distal fibula anatomic plate with distal locking cluster due to the distal nature of the fracture, as well as locking options proximal.  We secured this with distal locking screws x4.  Proximally we secured this with 3.5 mm nonlocking screws.. Bone quality was good. I used c-arm to confirm satisfactory reduction and fixation.   I then turned my attention to the medial malleolus. Incision was made over the medial malleolus and the fracture exposed and held provisionally with a clamp.  1 guidepin was placed for the 4.0 mm cannulated screw and then confirmation of reduction was made with fluoroscopy.  I then placed a single cannulated 4.0 mm partially-threaded screw with good compression.  Due to the low transverse nature of this fracture there was no real estate to place a posterior screw.  The syndesmosis was stressed using live fluoroscopy and found to be unstable.  Next, we moved to fixate the syndesmosis.  By elevating the heel off the bed with a 2 towel bump, we then drilled through all 4 cortices of the fibula and tibia from the lateral plate on the fibula through  the medial cortical goal density of the tibia.  This was placed in a hole that was about 2 cm from the plafond.  We then placed the tight rope internal fixation device while directly visualizing  the syndesmotic reduction.  The ankle was placed in neutral dorsiflexion tight rope Endobutton was flipped on the medial cortex.  We then tightened this he had a lateral closed-loop device.  This provided excellent fixation.  We then performed another stress radiography intraoperatively which was found to be stable.  The wounds were irrigated, and closed with vicryl with routine closure for the skin. The wounds were not injected with local anesthetic. Sterile gauze was applied followed by a posterior splint. She was awakened and returned to the PACU in stable and satisfactory condition. There were no complications.  All counts were correct x2.  POSTOPERATIVE PLAN: Ms. Hajdu will remain nonweightbearing on this leg for approximately 6 weeks; Ms. Guadian will return for suture removal in 2 weeks.  She will be immobilized in a short leg splint and then transitioned to a CAM walker at his first follow up appointment.  Ms. Goard will receive DVT prophylaxis based on other medications, activity level, and risk ratio of bleeding to thrombosis.  Geralynn Rile, MD [facility] [phone] 3:43 PM

## 2021-04-14 NOTE — Transfer of Care (Signed)
Immediate Anesthesia Transfer of Care Note  Patient: [name]  Procedure(s) Performed: OPEN REDUCTION INTERNAL FIXATION (ORIF) RIGHT ANKLE FRACTURE WITH SYNDESMOTIC FIXATION (Right: Ankle)  Patient Location: PACU  Anesthesia Type:MAC  Level of Consciousness: drowsy and patient cooperative  Airway & Oxygen Therapy: Patient Spontanous Breathing  Post-op Assessment: Report given to RN and Post -op Vital signs reviewed and stable  Post vital signs: Reviewed and stable  Last Vitals:  Vitals Value Taken Time  BP 120/73 04/14/21 1548  Temp 36.9 C 04/14/21 1548  Pulse 75 04/14/21 1550  Resp 14 04/14/21 1550  SpO2 100 % 04/14/21 1550  Vitals shown include unvalidated device data.  Last Pain:  Vitals:   04/14/21 1357  TempSrc:   PainSc: 0-No pain      Patients Stated Pain Goal: 6 (88/82/80 0349)  Complications: No notable events documented.

## 2021-04-14 NOTE — H&P (Signed)
ORTHOPAEDIC H and P  REQUESTING PHYSICIAN: Nicholes Stairs, MD  PCP:  Patient, No Pcp Per (Inactive)  Chief Complaint: Right ankle fracture  HPI: Cheryl Holloway is a 45 y.o. female who complains of right ankle pain and deformity following a fall while walking her dog.  She is little over a week out.  We have been allowing for soft tissue recovery.  She is here today for open reduction internal fixation.  No new complaints at this time.  Past Medical History:  Diagnosis Date   Anemia    takes iron   Breast cancer ([place])    Fibroids    uterine   History of kidney stones 1994   Past Surgical History:  Procedure Laterality Date   BREAST IMPLANT REMOVAL Right 04/25/2014   Procedure: REMOVAL BREAST IMPLANT WITH CAPSULECTOMY;  Surgeon: Theodoro Kos, DO;  Location: [facility];  Service: Plastics;  Laterality: Right;   BREAST RECONSTRUCTION     BREAST REDUCTION SURGERY     CESAREAN SECTION     x3   CYSTOSCOPY N/A 10/07/2020   Procedure: CYSTOSCOPY;  Surgeon: Woodroe Mode, MD;  Location: [facility];  Service: Gynecology;  Laterality: N/A;   DILATION AND CURETTAGE OF UTERUS  1993   ESOPHAGOGASTRODUODENOSCOPY N/A 01/02/2016   Procedure: ESOPHAGOGASTRODUODENOSCOPY (EGD);  Surgeon: Clarene Essex, MD;  Location: [facility] ENDOSCOPY;  Service: Endoscopy;  Laterality: N/A;   GASTRIC BYPASS     LAPAROSCOPY N/A 10/07/2020   Procedure: LAPAROSCOPY DIAGNOSTIC;  Surgeon: Woodroe Mode, MD;  Location: [facility];  Service: Gynecology;  Laterality: N/A;   MASTECTOMY     right   SIMPLE MASTECTOMY WITH AXILLARY SENTINEL NODE BIOPSY Left 04/25/2014   Procedure: LEFT TOTAL  MASTECTOMY (PROPHYLACTIC);  Surgeon: Rolm Bookbinder, MD;  Location: [facility];  Service: General;  Laterality: Left;   SUPRACERVICAL ABDOMINAL HYSTERECTOMY N/A 10/07/2020   Procedure: HYSTERECTOMY SUPRACERVICAL ABDOMINAL;  Surgeon: Woodroe Mode, MD;  Location: [facility];  Service: Gynecology;  Laterality: N/A;    TUBAL LIGATION     UNILATERAL SALPINGECTOMY Right 10/07/2020   Procedure: UNILATERAL SALPINGECTOMY;  Surgeon: Woodroe Mode, MD;  Location: [facility];  Service: Gynecology;  Laterality: Right;   Social History   Socioeconomic History   Marital status: Married    Spouse name: Not on file   Number of children: Not on file   Years of education: Not on file   Highest education level: Not on file  Occupational History   Not on file  Tobacco Use   Smoking status: Never   Smokeless tobacco: Never  Vaping Use   Vaping Use: Never used  Substance and Sexual Activity   Alcohol use: Not Currently    Comment: social   Drug use: No   Sexual activity: Not Currently  Other Topics Concern   Not on file  Social History Narrative   Not on file   Social Determinants of Health   Financial Resource Strain: Not on file  Food Insecurity: No Food Insecurity   Worried About Running Out of Food in the Last Year: Never true   [place] in the Last Year: Never true  Transportation Needs: No Transportation Needs   Lack of Transportation (Medical): No   Lack of Transportation (Non-Medical): No  Physical Activity: Not on file  Stress: Not on file  Social Connections: Not on file   Family History  Problem Relation Age of Onset   Healthy Mother  Allergies  Allergen Reactions   Ibuprofen Hives   Prior to Admission medications   Medication Sig Start Date End Date Taking? Authorizing Provider  Fiber Adult Gummies 2 g CHEW Chew 1 tablet by mouth daily.   Yes [provider]  Multiple Vitamin (MULTIVITAMIN ADULT PO) Take 1 tablet by mouth daily. prenatel   Yes [provider]  oxyCODONE (OXY IR/ROXICODONE) 5 MG immediate release tablet Take 1-2 tablets (5-10 mg total) by mouth every 4 (four) hours as needed for moderate pain. Patient not taking: No sig reported 10/09/20   Woodroe Mode, MD   No results found.  Positive ROS: All other systems have been reviewed and were  otherwise negative with the exception of those mentioned in the HPI and as above.  Physical Exam: General: Alert, no acute distress Cardiovascular: No pedal edema Respiratory: No cyanosis, no use of accessory musculature GI: No organomegaly, abdomen is soft and non-tender Skin: No lesions in the area of chief complaint Neurologic: Sensation intact distally Psychiatric: Patient is competent for consent with normal mood and affect Lymphatic: No axillary or cervical lymphadenopathy  MUSCULOSKELETAL:  Right lower extremity is warm well perfused.  Splint is removed and she has positive wrinkle sign.  Mild to moderate swelling about the ankle.  Neurovascular intact.  Assessment: Right ankle trimalleolar ankle fracture, closed.  Plan: -Plan to proceed with open reduction and internal fixation of the right ankle.  We again reviewed the risk of bleeding, infection, damage to surrounding nerves and vessels, numbness, tingling, stiffness, post fixation arthritis, DVT, and the risk of anesthesia.  She has provided informed consent.  -Plan will be to discharge home postoperatively from PACU.    Nicholes Stairs, MD [facility] [phone]    04/14/2021 1:46 PM

## 2021-04-14 NOTE — Anesthesia Procedure Notes (Addendum)
Anesthesia Regional Block: Adductor canal block   Pre-Anesthetic Checklist: , timeout performed,  Correct Patient, Correct Site, Correct Laterality,  Correct Procedure, Correct Position, site marked,  Risks and benefits discussed,  Surgical consent,  Pre-op evaluation,  At surgeon's request and post-op pain management  Laterality: Lower and Right  Prep: chloraprep       Needles:  Injection technique: Single-shot  Needle Type: Echogenic Needle     Needle Length: 9cm  Needle Gauge: 22     Additional Needles:   Procedures:,,,, ultrasound used (permanent image in chart),,    Narrative:  Start time: 04/14/2021 1:52 PM End time: 04/14/2021 1:56 PM Injection made incrementally with aspirations every 5 mL.  Performed by: Personally  Anesthesiologist: Barnet Glasgow, MD  Additional Notes: Block assessed prior to surgery. Pt tolerated procedure well.

## 2021-04-14 NOTE — Discharge Instructions (Signed)
-  Maintain postoperative splint at all times.  Do not let this become soiled or wet.  -You should elevate your operative extremity with your "toes above nose."  As a often as possible throughout the day.  -For mild to moderate pain you should take Tylenol and Advil in an alternating fashion scheduled around-the-clock.  For breakthrough pain you should take oxycodone as directed.  -Strictly nonweightbearing to the operative extremity at all times.  -For the prevention of blood clot she should take an 81 mg aspirin twice per day for 6 weeks.  -Return to see Dr. Stann Mainland in the office in 2 weeks for routine postop care.

## 2021-04-14 NOTE — Brief Op Note (Signed)
04/14/2021  3:37 PM  PATIENT:  Cheryl Holloway  45 y.o. female  PRE-OPERATIVE DIAGNOSIS:  Right ankle trimalleolar fracture  POST-OPERATIVE DIAGNOSIS:   1.  Right ankle trimalleolar fracture 2.  Right ankle syndesmotic joint disruption  PROCEDURE:  Procedure(s): 1.  OPEN REDUCTION INTERNAL FIXATION (ORIF) RIGHT ANKLE  FRACTURE, TRIMALLEOLAR ANKLE FRACTURE WITHOUT POSTERIOR MALLEOLUS FIXATION 2.  RIGHT ANKLE FRACTURE INTERNAL FIXATION WITH SYNDESMOTIC FIXATION (Right)  SURGEON:  Surgeon(s) and Role:    * Stann Mainland, Elly Modena, MD - Primary  PHYSICIAN ASSISTANT: Jonelle Sidle, PA-C   ANESTHESIA:   regional and general  EBL:  30 cc  BLOOD ADMINISTERED:none  DRAINS: none   LOCAL MEDICATIONS USED:  NONE  SPECIMEN:  No Specimen  DISPOSITION OF SPECIMEN:  N/A  COUNTS:  YES  TOURNIQUET:  * Missing tourniquet times found for documented tourniquets in log: [id number] *  DICTATION: .Note written in EPIC  PLAN OF CARE: Discharge to home after PACU  PATIENT DISPOSITION:  PACU - hemodynamically stable.   Delay start of Pharmacological VTE agent (>24hrs) due to surgical blood loss or risk of bleeding: not applicable

## 2021-04-14 NOTE — Anesthesia Preprocedure Evaluation (Addendum)
Anesthesia Evaluation  Patient identified by MRN, date of birth, ID band Patient awake    Reviewed: Allergy & Precautions, NPO status , Patient's Chart, lab work & pertinent test results  Airway Mallampati: II  TM Distance: >3 FB Neck ROM: Full    Dental no notable dental hx. (+) Teeth Intact, Dental Advisory Given   Pulmonary neg pulmonary ROS,    Pulmonary exam normal breath sounds clear to auscultation       Cardiovascular negative cardio ROS Normal cardiovascular exam Rhythm:Regular Rate:Normal     Neuro/Psych negative neurological ROS     GI/Hepatic   Endo/Other    Renal/GU      Musculoskeletal negative musculoskeletal ROS (+)   Abdominal (+) + obese,   Peds  Hematology  (+) anemia ,   Anesthesia Other Findings Breast CA  Reproductive/Obstetrics negative OB ROS                            Anesthesia Physical Anesthesia Plan  ASA: 2  Anesthesia Plan: Regional   Post-op Pain Management:    Induction:   PONV Risk Score and Plan: Treatment may vary due to age or medical condition and Ondansetron  Airway Management Planned: Natural Airway and Nasal Cannula  Additional Equipment: None  Intra-op Plan:   Post-operative Plan:   Informed Consent: I have reviewed the patients History and Physical, chart, labs and discussed the procedure including the risks, benefits and alternatives for the proposed anesthesia with the patient or authorized representative who has indicated his/her understanding and acceptance.     Dental advisory given  Plan Discussed with: CRNA and Anesthesiologist  Anesthesia Plan Comments: (R adductor plus popliteal block)       Anesthesia Quick Evaluation

## 2021-04-14 NOTE — Anesthesia Procedure Notes (Addendum)
Anesthesia Regional Block: Popliteal block   Pre-Anesthetic Checklist: , timeout performed,  Correct Patient, Correct Site, Correct Laterality,  Correct Procedure, Correct Position, site marked,  Risks and benefits discussed,  Pre-op evaluation,  At surgeon's request and post-op pain management  Laterality: Lower and Right  Prep: Maximum Sterile Barrier Precautions used, chloraprep       Needles:  Injection technique: Single-shot  Needle Type: Echogenic Needle     Needle Length: 9cm  Needle Gauge: 21     Additional Needles:   Procedures:,,,, ultrasound used (permanent image in chart),,    Narrative:  Start time: 04/14/2021 1:45 PM End time: 04/14/2021 1:51 PM Injection made incrementally with aspirations every 5 mL.  Performed by: Personally  Anesthesiologist: Barnet Glasgow, MD  Additional Notes: Block assessed. Patient tolerated procedure well.

## 2021-04-16 ENCOUNTER — Encounter (HOSPITAL_COMMUNITY): Payer: Self-pay | Admitting: Orthopedic Surgery

## 2021-04-16 DIAGNOSIS — Z4789 Encounter for other orthopedic aftercare: Secondary | ICD-10-CM | POA: Insufficient documentation

## 2021-04-16 NOTE — Addendum Note (Signed)
Addendum  created 04/16/21 1038 by Barnet Glasgow, MD   Clinical Note Signed, Intraprocedure Blocks edited, SmartForm saved

## 2022-08-16 DIAGNOSIS — Z419 Encounter for procedure for purposes other than remedying health state, unspecified: Secondary | ICD-10-CM | POA: Diagnosis not present

## 2022-09-04 IMAGING — US US PELVIS COMPLETE WITH TRANSVAGINAL
1 series · 15 of 25 positions shown · non-contrast
Comparison: None

CLINICAL DATA: Dysfunctional uterine bleeding, uterine fibroid,
history breast cancer; LMP March 2020 specific date not provided;
history tubal ligation, Caesarean section

EXAM:
TRANSABDOMINAL AND TRANSVAGINAL ULTRASOUND OF PELVIS
TECHNIQUE: Both transabdominal and transvaginal ultrasound examinations of the
pelvis were performed. Transabdominal technique was performed for
global imaging of the pelvis including uterus, ovaries, adnexal
regions, and pelvic cul-de-sac. It was necessary to proceed with
endovaginal exam following the transabdominal exam to visualize the
endometrium and ovaries.

[Series 1: us pelvis complete with transvaginal · 15 of 107 slices shown]
[im 1/107]
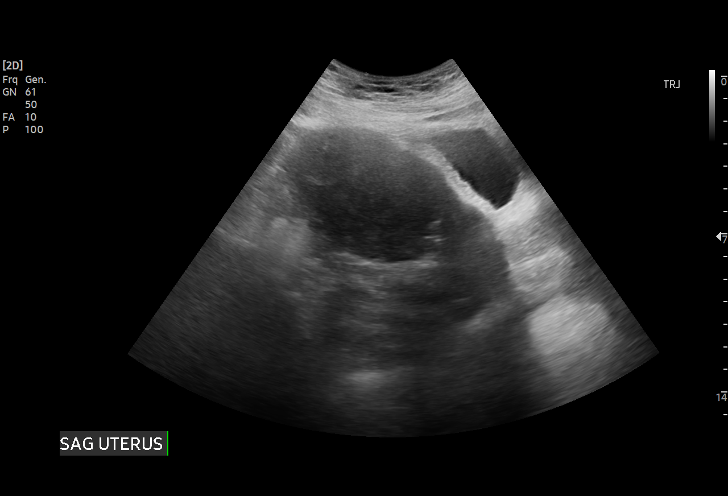
[im 9/107]
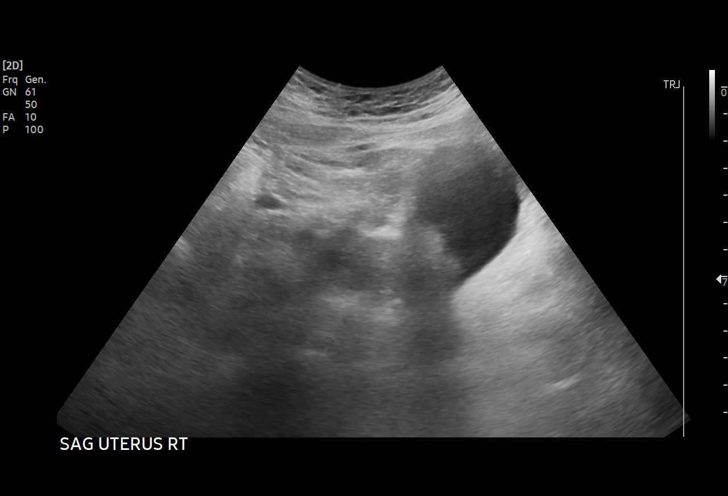
[im 18/107]
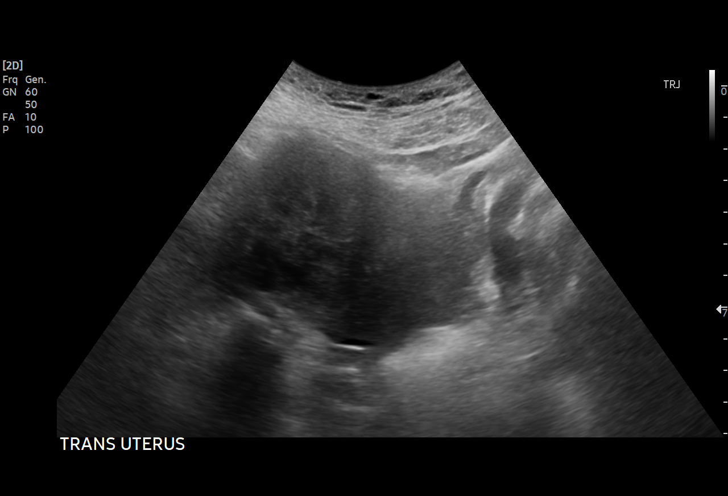
[im 23/107]
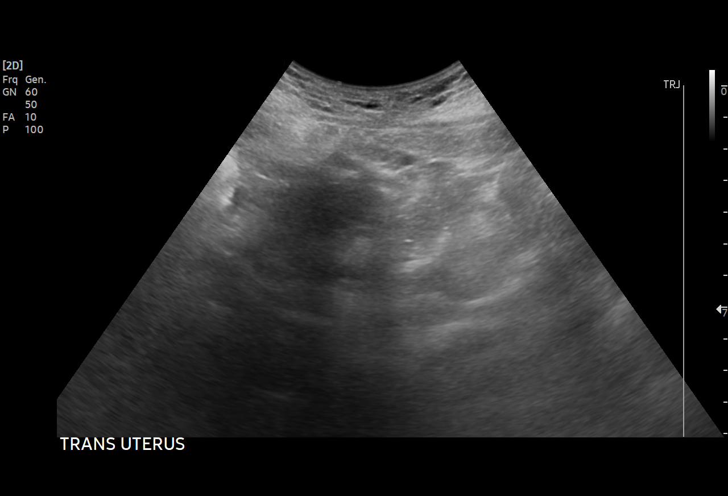
[im 31/107]
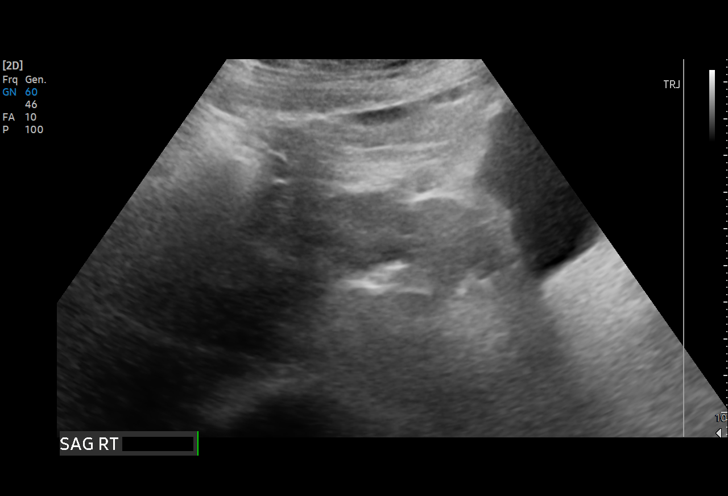
[im 40/107]
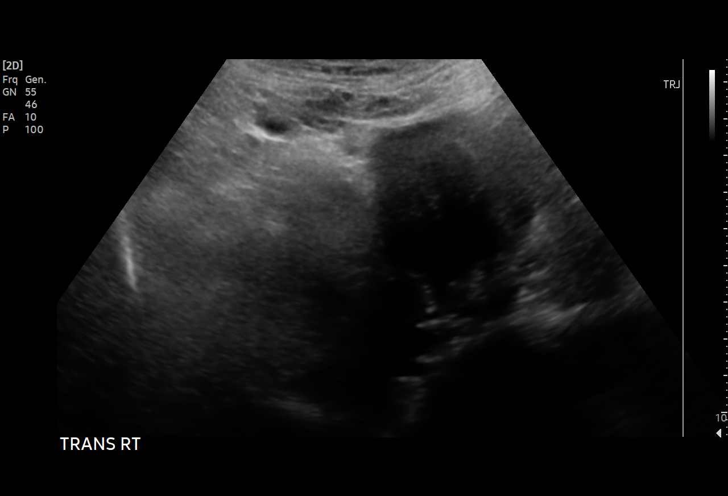
[im 45/107]
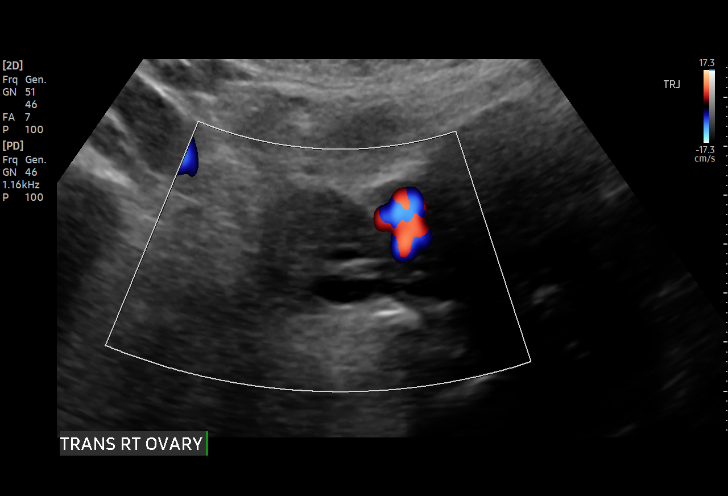
[im 54/107]
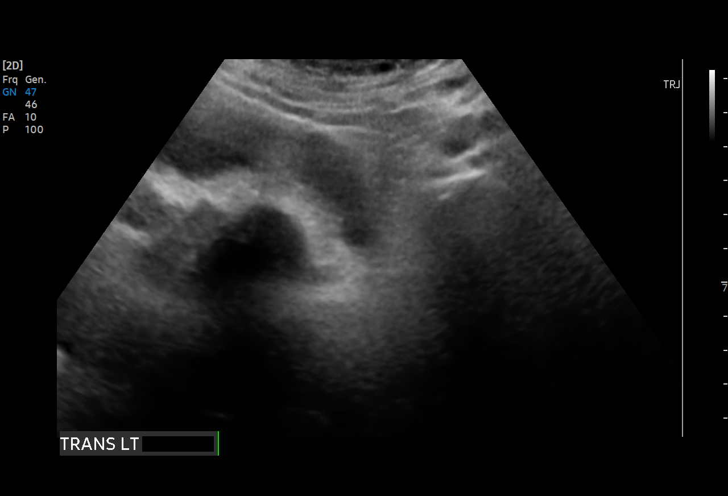
[im 62/107]
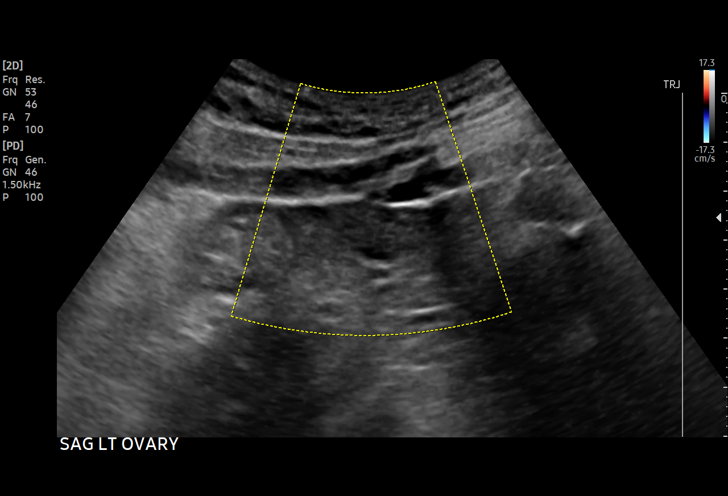
[im 67/107]
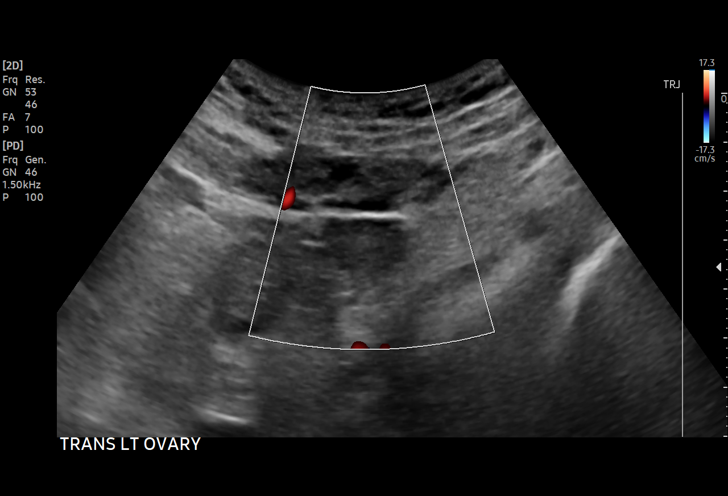
[im 76/107]
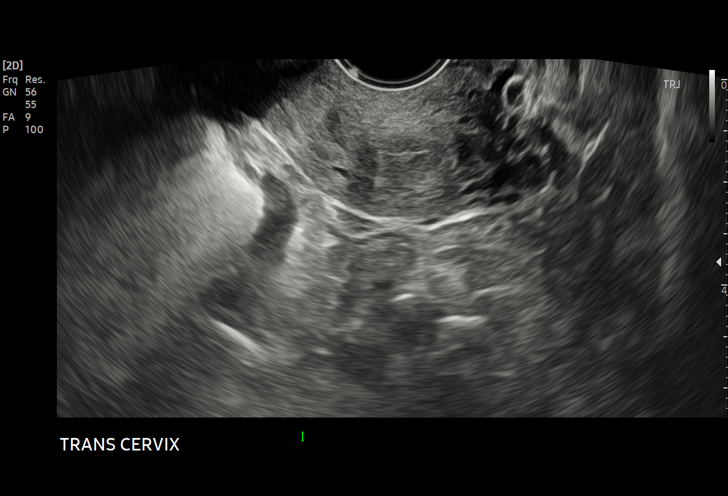
[im 84/107]
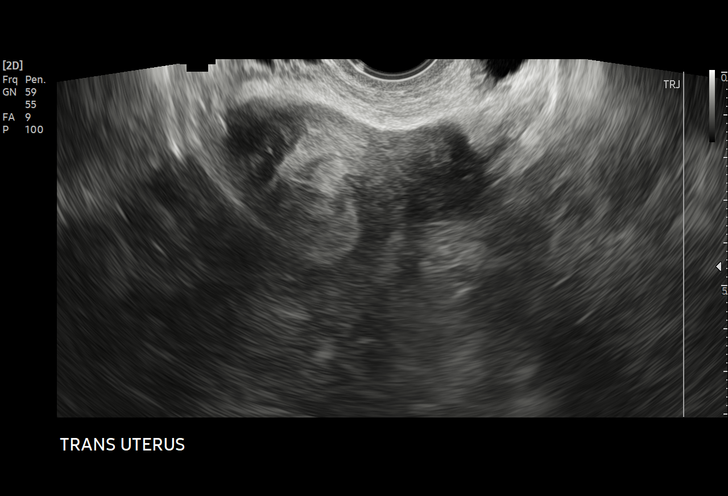
[im 89/107]
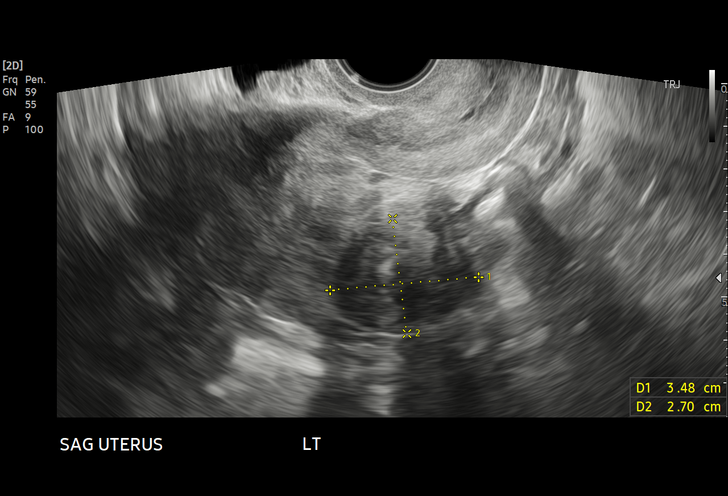
[im 98/107]
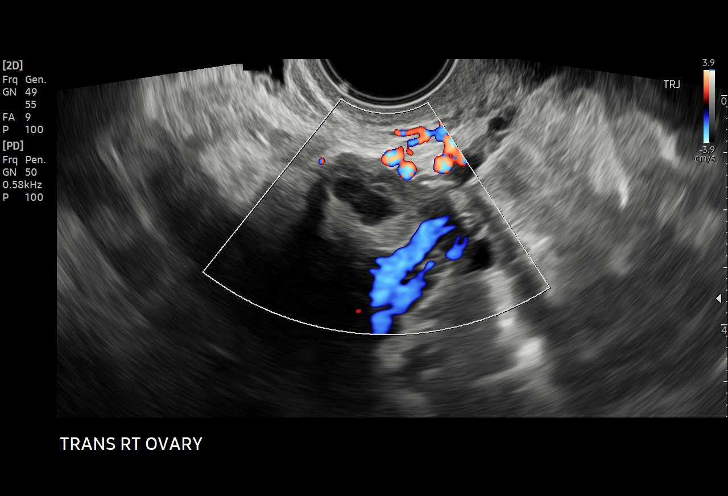
[im 107/107]
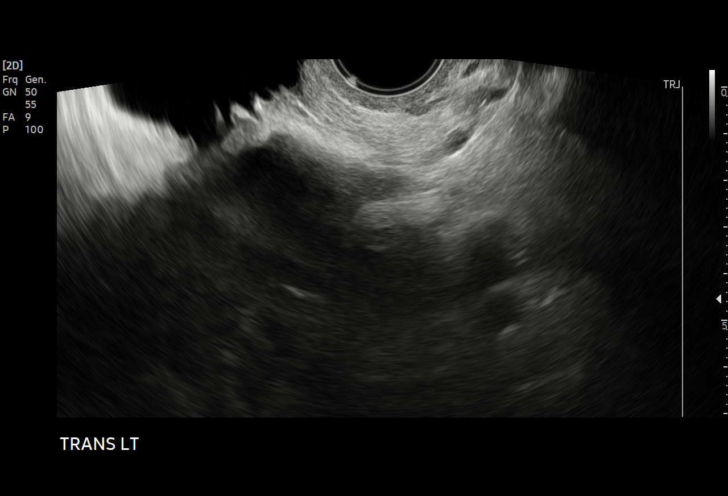

[15 of 25 positions shown; findings below may reference images not displayed]

FINDINGS: Uterus

Measurements: 11.3 x 5.4 x 7.6 cm = volume: 242 mL. Anteverted.
Lobulated appearance of the mid upper uterus due to presence of
multiple uterine leiomyomata. These include a transmural fundal
leiomyoma 6.7 cm greatest diameter, posterior wall leiomyoma to LEFT
3.6 cm greatest diameter, and a small submucosal upper uterine
anterior leiomyoma 2.2 cm diameter.

Endometrium

Thickness: 5 mm. Difficult to visualize due to distortion from
uterine leiomyomata. No obvious endometrial fluid.

Right ovary

Measurements: 2.3 x 1.9 x 2.4 cm = volume: 5.5 mL. Normal morphology
without mass

Left ovary

Measurements: 2.5 x 1.7 x 1.6 cm = volume: 3.3 mL. Normal morphology
without mass

Other findingsTrace free pelvic fluid.  No adnexal masses.
IMPRESSION: Mildly enlarged uterus containing at least 3 uterine leiomyomata,
largest 6.7 mm diameter.

## 2022-09-16 DIAGNOSIS — Z419 Encounter for procedure for purposes other than remedying health state, unspecified: Secondary | ICD-10-CM | POA: Diagnosis not present

## 2022-10-15 DIAGNOSIS — Z419 Encounter for procedure for purposes other than remedying health state, unspecified: Secondary | ICD-10-CM | POA: Diagnosis not present

## 2022-11-15 DIAGNOSIS — Z419 Encounter for procedure for purposes other than remedying health state, unspecified: Secondary | ICD-10-CM | POA: Diagnosis not present

## 2022-12-15 DIAGNOSIS — Z419 Encounter for procedure for purposes other than remedying health state, unspecified: Secondary | ICD-10-CM | POA: Diagnosis not present

## 2023-01-15 DIAGNOSIS — Z419 Encounter for procedure for purposes other than remedying health state, unspecified: Secondary | ICD-10-CM | POA: Diagnosis not present

## 2023-01-19 ENCOUNTER — Ambulatory Visit (INDEPENDENT_AMBULATORY_CARE_PROVIDER_SITE_OTHER): Payer: Medicaid Other

## 2023-01-19 ENCOUNTER — Encounter: Payer: Self-pay | Admitting: Nurse Practitioner

## 2023-01-19 ENCOUNTER — Ambulatory Visit (HOSPITAL_COMMUNITY)
Admission: RE | Admit: 2023-01-19 | Discharge: 2023-01-19 | Disposition: A | Discharge status: Home / Self Care | Payer: Medicaid Other | Source: Ambulatory Visit | Attending: Physician Assistant | Admitting: Physician Assistant

## 2023-01-19 ENCOUNTER — Encounter (HOSPITAL_COMMUNITY): Payer: Self-pay

## 2023-01-19 VITALS — BP 139/93 | HR 75 | Temp 98.0°F | Resp 20 | Wt 191.4 lb

## 2023-01-19 DIAGNOSIS — R112 Nausea with vomiting, unspecified: Secondary | ICD-10-CM | POA: Insufficient documentation

## 2023-01-19 DIAGNOSIS — R634 Abnormal weight loss: Secondary | ICD-10-CM | POA: Insufficient documentation

## 2023-01-19 DIAGNOSIS — R197 Diarrhea, unspecified: Secondary | ICD-10-CM | POA: Insufficient documentation

## 2023-01-19 LAB — CBC WITH DIFFERENTIAL/PLATELET
Abs Immature Granulocytes: 0.02 10*3/uL (ref 0.00–0.07)
Basophils Absolute: 0 10*3/uL (ref 0.0–0.1)
Basophils Relative: 1 %
Eosinophils Absolute: 0.1 10*3/uL (ref 0.0–0.5)
Eosinophils Relative: 1 %
HCT: 37.6 % (ref 36.0–46.0)
Hemoglobin: 11.7 g/dL — ABNORMAL LOW (ref 12.0–15.0)
Immature Granulocytes: 0 %
Lymphocytes Relative: 20 %
Lymphs Abs: 0.9 10*3/uL (ref 0.7–4.0)
MCH: 27.1 pg (ref 26.0–34.0)
MCHC: 31.1 g/dL (ref 30.0–36.0)
MCV: 87.2 fL (ref 80.0–100.0)
Monocytes Absolute: 0.3 10*3/uL (ref 0.1–1.0)
Monocytes Relative: 6 %
Neutro Abs: 3.4 10*3/uL (ref 1.7–7.7)
Neutrophils Relative %: 72 %
Platelets: 98 10*3/uL — ABNORMAL LOW (ref 150–400)
RBC: 4.31 MIL/uL (ref 3.87–5.11)
RDW: 17.2 % — ABNORMAL HIGH (ref 11.5–15.5)
WBC: 4.7 10*3/uL (ref 4.0–10.5)
nRBC: 0 % (ref 0.0–0.2)

## 2023-01-19 LAB — COMPREHENSIVE METABOLIC PANEL
ALT: 218 U/L — ABNORMAL HIGH (ref 0–44)
AST: 531 U/L — ABNORMAL HIGH (ref 15–41)
Albumin: 3.3 g/dL — ABNORMAL LOW (ref 3.5–5.0)
Alkaline Phosphatase: 168 U/L — ABNORMAL HIGH (ref 38–126)
Anion gap: 12 (ref 5–15)
BUN: 11 mg/dL (ref 6–20)
CO2: 24 mmol/L (ref 22–32)
Calcium: 8.7 mg/dL — ABNORMAL LOW (ref 8.9–10.3)
Chloride: 102 mmol/L (ref 98–111)
Creatinine, Ser: 0.86 mg/dL (ref 0.44–1.00)
GFR, Estimated: >60 mL/min (ref >60)
Glucose, Bld: 105 mg/dL — ABNORMAL HIGH (ref 70–99)
Potassium: 4 mmol/L (ref 3.5–5.1)
Sodium: 138 mmol/L (ref 135–145)
Total Bilirubin: 0.9 mg/dL (ref 0.3–1.2)
Total Protein: 6.7 g/dL (ref 6.5–8.1)

## 2023-01-19 LAB — POCT URINALYSIS DIP (MANUAL ENTRY)
Bilirubin, UA: NEGATIVE
Blood, UA: NEGATIVE
Glucose, UA: NEGATIVE mg/dL
Leukocytes, UA: NEGATIVE
Nitrite, UA: NEGATIVE
Protein Ur, POC: NEGATIVE mg/dL
Spec Grav, UA: 1.02 (ref 1.010–1.025)
Urobilinogen, UA: 0.2 E.U./dL
pH, UA: 6 (ref 5.0–8.0)

## 2023-01-19 LAB — HEMOGLOBIN A1C
Hgb A1c MFr Bld: 5.7 % — ABNORMAL HIGH (ref 4.8–5.6)
Mean Plasma Glucose: 116.89 mg/dL

## 2023-01-19 LAB — TSH: TSH: 2.503 u[IU]/mL (ref 0.350–4.500)

## 2023-01-19 LAB — T4, FREE: Free T4: 1.01 ng/dL (ref 0.61–1.12)

## 2023-01-19 LAB — HEPATITIS C ANTIBODY: HCV Ab: NONREACTIVE

## 2023-01-19 LAB — LIPASE, BLOOD: Lipase: 18 U/L (ref 11–51)

## 2023-01-19 MED ORDER — DICYCLOMINE HCL 10 MG PO CAPS: 10.0000 mg | ORAL_CAPSULE | Freq: Three times a day (TID) | ORAL | 0 refills | Status: DC

## 2023-01-19 MED ORDER — ONDANSETRON 4 MG PO TBDP: 4.0000 mg | ORAL_TABLET | Freq: Three times a day (TID) | ORAL | 0 refills | Status: DC | PRN

## 2023-01-19 NOTE — ED Triage Notes (Signed)
Pt reports emesis and diarrhea for the past 3 months as well as a drastic amount of weight loss. States anything she eats comes back up. Currently weighs 191.4 lbs and states normal weigh ranges from 225-230.   Also mentions her mom had similar symptoms and had to surgery for removal of the thyroid.

## 2023-01-19 NOTE — ED Provider Notes (Signed)
[facility]    CSN: [id number] Arrival date & time: 01/19/23  4098      History   Chief Complaint Chief Complaint  Patient presents with   Emesis   Diarrhea    HPI Cheryl Holloway is a 47 y.o. female.   Patient presents today with a 29-month history of unintentional weight loss.  Reports that she has lost approximately 40 pounds in this timeframe.  She reports associated abdominal pain, nausea, vomiting, diarrhea.  Her symptoms are triggered by eating and she has not identified any specific foods that trigger it; happens after most oral intake.  She does have a history of malignancy but was treated many years ago.  She has had gastric bypass but reports that her weight had stabilized.  She denies any associated melena, hematochezia, hematemesis.  She reports being up-to-date on age-appropriate cancer screening.  Denies any history of thyroid condition but does have a family history of hypothyroidism in her mother.  She does report associated intermittent palpitations and heat intolerance.  Denies any dietary or medication changes.  She has started Pepto-Bismol without improvement of symptoms.  She has not seen a GI specialist.  Does not currently have a primary care provider.    Past Medical History:  Diagnosis Date   Anemia    takes iron   Breast cancer (HCC)    Fibroids    uterine   History of kidney stones 1994    Patient Active Problem List   Diagnosis Date Noted   Sepsis (HCC) 03/27/2016   Fatty liver 01/01/2016   UGIB (upper gastrointestinal bleed) 01/01/2016   Malignant neoplasm of right female breast (HCC) 09/30/2015   Unintentional weight loss 09/30/2015   S/P mastectomy 04/25/2014   Breast cancer, right (HCC) 01/27/2014   Gastric bypass status for obesity 01/27/2014   Iron deficiency anemia due to chronic blood loss 01/27/2014    Past Surgical History:  Procedure Laterality Date   BREAST IMPLANT REMOVAL Right 04/25/2014   Procedure: REMOVAL BREAST  IMPLANT WITH CAPSULECTOMY;  Surgeon: Wayland Denis, DO;  Location: [facility];  Service: Plastics;  Laterality: Right;   BREAST RECONSTRUCTION     BREAST REDUCTION SURGERY     CESAREAN SECTION     x3   CYSTOSCOPY N/A 10/07/2020   Procedure: CYSTOSCOPY;  Surgeon: Adam Phenix, MD;  Location: [facility] OR;  Service: Gynecology;  Laterality: N/A;   DILATION AND CURETTAGE OF UTERUS  1993   ESOPHAGOGASTRODUODENOSCOPY N/A 01/02/2016   Procedure: ESOPHAGOGASTRODUODENOSCOPY (EGD);  Surgeon: Vida Rigger, MD;  Location: [facility] ENDOSCOPY;  Service: Endoscopy;  Laterality: N/A;   GASTRIC BYPASS     LAPAROSCOPY N/A 10/07/2020   Procedure: LAPAROSCOPY DIAGNOSTIC;  Surgeon: Adam Phenix, MD;  Location: [facility] OR;  Service: Gynecology;  Laterality: N/A;   MASTECTOMY     right   ORIF ANKLE FRACTURE Right 04/14/2021   Procedure: OPEN REDUCTION INTERNAL FIXATION (ORIF) RIGHT ANKLE FRACTURE WITH SYNDESMOTIC FIXATION;  Surgeon: Yolonda Kida, MD;  Location[facility];  Service: Orthopedics;  Laterality: Right;   SIMPLE MASTECTOMY WITH AXILLARY SENTINEL NODE BIOPSY Left 04/25/2014   Procedure: LEFT TOTAL  MASTECTOMY (PROPHYLACTIC);  Surgeon: Emelia Loron, MD;  Location: [facility];  Service: General;  Laterality: Left;   SUPRACERVICAL ABDOMINAL HYSTERECTOMY N/A 10/07/2020   Procedure: HYSTERECTOMY SUPRACERVICAL ABDOMINAL;  Surgeon: Adam Phenix, MD;  Location: [facility] OR;  Service: Gynecology;  Laterality: N/A;   TUBAL LIGATION     UNILATERAL SALPINGECTOMY Right  10/07/2020   Procedure: UNILATERAL SALPINGECTOMY;  Surgeon: Adam Phenix, MD;  Location: [facility] OR;  Service: Gynecology;  Laterality: Right;    OB History     Gravida  4   Para  3   Term  3   Preterm      AB  1   Living  3      SAB  1   IAB      Ectopic      Multiple      Live Births  3            Home Medications    Prior to Admission medications   Medication Sig Start Date End Date Taking?  Authorizing Provider  dicyclomine (BENTYL) 10 MG capsule Take 1 capsule (10 mg total) by mouth 3 (three) times daily before meals. 01/19/23  Yes Cordon Gassett K, PA-C  Fiber Adult Gummies 2 g CHEW Chew 1 tablet by mouth daily.    [provider]  Multiple Vitamin (MULTIVITAMIN ADULT PO) Take 1 tablet by mouth daily. prenatel    [provider]  ondansetron (ZOFRAN ODT) 4 MG disintegrating tablet Take 1 tablet (4 mg total) by mouth every 8 (eight) hours as needed. 01/19/23   Leondre Taul, Noberto Retort, PA-C  oxyCODONE (OXY IR/ROXICODONE) 5 MG immediate release tablet Take 1-2 tablets (5-10 mg total) by mouth every 4 (four) hours as needed for moderate pain or severe pain. 04/14/21   Yolonda Kida, MD    Family History Family History  Problem Relation Age of Onset   Healthy Mother     Social History Social History   Tobacco Use   Smoking status: Never   Smokeless tobacco: Never  Vaping Use   Vaping Use: Never used  Substance Use Topics   Alcohol use: Not Currently    Comment: social   Drug use: No     Allergies   Ibuprofen   Review of Systems Review of Systems  Constitutional:  Positive for activity change and unexpected weight change. Negative for appetite change, fatigue and fever.  Respiratory:  Negative for cough and shortness of breath.   Cardiovascular:  Positive for palpitations. Negative for chest pain.  Gastrointestinal:  Positive for abdominal pain, diarrhea, nausea and vomiting. Negative for blood in stool and constipation.  Endocrine: Positive for heat intolerance. Negative for cold intolerance.  Genitourinary:  Negative for dysuria, frequency, urgency, vaginal bleeding, vaginal discharge and vaginal pain.     Physical Exam Triage Vital Signs ED Triage Vitals  Enc Vitals Group     BP 01/19/23 1003 (!) 139/93     Pulse Rate 01/19/23 1003 75     Resp 01/19/23 1003 20     Temp 01/19/23 1003 98 F (36.7 C)     Temp Source 01/19/23 1003 Oral     SpO2  01/19/23 1003 98 %     Weight 01/19/23 1001 191 lb 6.4 oz (86.8 kg)     Height --      Head Circumference --      Peak Flow --      Pain Score 01/19/23 1001 0     Pain Loc --      Pain Edu? --      Excl. in GC? --    No data found.  Updated Vital Signs BP (!) 139/93 (BP Location: Left Arm)   Pulse 75   Temp 98 F (36.7 C) (Oral)   Resp 20   Wt 191 lb 6.4  oz (86.8 kg)   LMP 04/07/2020 (Approximate)   SpO2 98%   BMI 31.85 kg/m   Visual Acuity Right Eye Distance:   Left Eye Distance:   Bilateral Distance:    Right Eye Near:   Left Eye Near:    Bilateral Near:     Physical Exam Vitals reviewed.  Constitutional:      General: She is awake. She is not in acute distress.    Appearance: Normal appearance. She is well-developed. She is not ill-appearing.     Comments: Very pleasant female appears stated age in no acute distress sitting comfortably in exam room  HENT:     Head: Normocephalic and atraumatic.     Mouth/Throat:     Pharynx: Uvula midline. No oropharyngeal exudate or posterior oropharyngeal erythema.  Cardiovascular:     Rate and Rhythm: Normal rate and regular rhythm.     Heart sounds: Normal heart sounds, S1 normal and S2 normal. No murmur heard. Pulmonary:     Effort: Pulmonary effort is normal.     Breath sounds: Normal breath sounds. No wheezing, rhonchi or rales.     Comments: Clear to auscultation bilaterally Abdominal:     General: Bowel sounds are normal.     Palpations: Abdomen is soft.     Tenderness: There is abdominal tenderness in the right upper quadrant, epigastric area and left upper quadrant. There is no right CVA tenderness, left CVA tenderness, guarding or rebound.     Comments: Mild tenderness palpation throughout upper abdomen.  No evidence of acute abdomen on physical exam.  Psychiatric:        Behavior: Behavior is cooperative.      UC Treatments / Results  Labs (all labs ordered are listed, but only abnormal results are  displayed) Labs Reviewed  POCT URINALYSIS DIP (MANUAL ENTRY) - Abnormal; Notable for the following components:      Result Value   Color, UA straw (*)    Ketones, POC UA trace (5) (*)    All other components within normal limits  CBC WITH DIFFERENTIAL/PLATELET  COMPREHENSIVE METABOLIC PANEL  LIPASE, BLOOD  TSH  T4, FREE  HEPATITIS C ANTIBODY  HEMOGLOBIN A1C    EKG   Radiology DG Chest 2 View  Result Date: 01/19/2023 CLINICAL DATA:  Weight loss EXAM: CHEST - 2 VIEW COMPARISON:  Chest x-ray dated April 09, 2019 FINDINGS: The heart size and mediastinal contours are within normal limits. Both lungs are clear. The visualized skeletal structures are unremarkable. IMPRESSION: No active cardiopulmonary disease. Electronically Signed   By: Allegra Lai M.D.   On: 01/19/2023 10:54    Procedures Procedures (including critical care time)  Medications Ordered in UC Medications - No data to display  Initial Impression / Assessment and Plan / UC Course  I have reviewed the triage vital signs and the nursing notes.  Pertinent labs & imaging results that were available during my care of the patient were reviewed by me and considered in my medical decision making (see chart for details).     Patient is well-appearing, afebrile, nontoxic, nontachycardic.  Vital signs and physical exam are reassuring today with no indication for emergent evaluation or imaging.  Urine pregnancy was obtained as she is status post hysterectomy.  UA showed ketones but was otherwise normal with no evidence of infection.  Given her history of malignancy and unintentional weight loss x-ray was obtained that was normal.  Lab work including thyroid studies, CBC, CMP, HIV, hepatitis, A1c obtain and  are pending.  We will contact her if any of this is abnormal.  Will start Zofran to help with nausea symptoms as well as Bentyl for abdominal discomfort and diarrhea.  She was encouraged to eat a bland diet and drink plenty of  fluid.  Discussed that ultimately she will need to see a GI specialist for further evaluation and management and was given contact information for local provider with instruction to call to schedule an appointment.  She does not currently have a primary care so we will try to establish her with 1 via PCP assistance.  Discussed that if she has any worsening or changing symptoms including severe abdominal pain, persistent weight loss, nausea/vomiting interfering with oral intake, weakness, she needs to be seen immediately.  Strict return precautions given.    Final Clinical Impressions(s) / UC Diagnoses   Final diagnoses:  Unintentional weight loss  Nausea vomiting and diarrhea     Discharge Instructions      Your urine showed that you had not had much to eat today but was otherwise normal with no evidence of infection.  Your chest x-ray was normal.  We will contact you once we have your blood work results.  Start Zofran to help with your nausea.  Use dicyclomine before each meal to help with abdominal discomfort.  Ultimately, you need to see a GI specialist; call them to schedule an appointment.  I have also put in to help you find a primary care.  Someone should call you to schedule with PCP within a week or so.  If you have any worsening symptoms including severe abdominal pain, nausea/vomiting interfering with oral intake, weakness, blood in your stool, blood in your vomit you need to go to the emergency room.     ED Prescriptions     Medication Sig Dispense Auth. Provider   ondansetron (ZOFRAN ODT) 4 MG disintegrating tablet Take 1 tablet (4 mg total) by mouth every 8 (eight) hours as needed. 20 tablet Lauris Serviss K, PA-C   dicyclomine (BENTYL) 10 MG capsule Take 1 capsule (10 mg total) by mouth 3 (three) times daily before meals. 30 capsule Romonia Yanik K, PA-C      PDMP not reviewed this encounter.   Jeani Hawking, PA-C 01/19/23 1108

## 2023-01-19 NOTE — Discharge Instructions (Signed)
Your urine showed that you had not had much to eat today but was otherwise normal with no evidence of infection.  Your chest x-ray was normal.  We will contact you once we have your blood work results.  Start Zofran to help with your nausea.  Use dicyclomine before each meal to help with abdominal discomfort.  Ultimately, you need to see a GI specialist; call them to schedule an appointment.  I have also put in to help you find a primary care.  Someone should call you to schedule with PCP within a week or so.  If you have any worsening symptoms including severe abdominal pain, nausea/vomiting interfering with oral intake, weakness, blood in your stool, blood in your vomit you need to go to the emergency room.

## 2023-01-20 ENCOUNTER — Emergency Department (HOSPITAL_COMMUNITY)
Admission: EM | Admit: 2023-01-20 | Discharge: 2023-01-20 | Disposition: A | Discharge status: Home / Self Care | Payer: Medicaid Other | Attending: Emergency Medicine | Admitting: Emergency Medicine

## 2023-01-20 ENCOUNTER — Other Ambulatory Visit: Payer: Self-pay

## 2023-01-20 ENCOUNTER — Encounter (HOSPITAL_COMMUNITY): Payer: Self-pay | Admitting: Emergency Medicine

## 2023-01-20 ENCOUNTER — Emergency Department (HOSPITAL_COMMUNITY): Payer: Medicaid Other

## 2023-01-20 DIAGNOSIS — D696 Thrombocytopenia, unspecified: Secondary | ICD-10-CM | POA: Diagnosis not present

## 2023-01-20 DIAGNOSIS — Z853 Personal history of malignant neoplasm of breast: Secondary | ICD-10-CM | POA: Diagnosis not present

## 2023-01-20 DIAGNOSIS — R7401 Elevation of levels of liver transaminase levels: Secondary | ICD-10-CM | POA: Diagnosis not present

## 2023-01-20 DIAGNOSIS — R1013 Epigastric pain: Secondary | ICD-10-CM | POA: Diagnosis present

## 2023-01-20 DIAGNOSIS — K760 Fatty (change of) liver, not elsewhere classified: Secondary | ICD-10-CM | POA: Insufficient documentation

## 2023-01-20 LAB — I-STAT CHEM 8, ED
BUN: 10 mg/dL (ref 6–20)
Calcium, Ion: 1.07 mmol/L — ABNORMAL LOW (ref 1.15–1.40)
Chloride: 105 mmol/L (ref 98–111)
Creatinine, Ser: 0.7 mg/dL (ref 0.44–1.00)
Glucose, Bld: 103 mg/dL — ABNORMAL HIGH (ref 70–99)
HCT: 34 % — ABNORMAL LOW (ref 36.0–46.0)
Hemoglobin: 11.6 g/dL — ABNORMAL LOW (ref 12.0–15.0)
Potassium: 3.6 mmol/L (ref 3.5–5.1)
Sodium: 141 mmol/L (ref 135–145)
TCO2: 27 mmol/L (ref 22–32)

## 2023-01-20 LAB — CBC WITH DIFFERENTIAL/PLATELET
Abs Immature Granulocytes: 0.01 10*3/uL (ref 0.00–0.07)
Basophils Absolute: 0 10*3/uL (ref 0.0–0.1)
Basophils Relative: 1 %
Eosinophils Absolute: 0 10*3/uL (ref 0.0–0.5)
Eosinophils Relative: 1 %
HCT: 33.7 % — ABNORMAL LOW (ref 36.0–46.0)
Hemoglobin: 10.5 g/dL — ABNORMAL LOW (ref 12.0–15.0)
Immature Granulocytes: 0 %
Lymphocytes Relative: 24 %
Lymphs Abs: 1.1 10*3/uL (ref 0.7–4.0)
MCH: 27.9 pg (ref 26.0–34.0)
MCHC: 31.2 g/dL (ref 30.0–36.0)
MCV: 89.4 fL (ref 80.0–100.0)
Monocytes Absolute: 0.4 10*3/uL (ref 0.1–1.0)
Monocytes Relative: 9 %
Neutro Abs: 2.9 10*3/uL (ref 1.7–7.7)
Neutrophils Relative %: 65 %
Platelets: 83 10*3/uL — ABNORMAL LOW (ref 150–400)
RBC: 3.77 MIL/uL — ABNORMAL LOW (ref 3.87–5.11)
RDW: 17.4 % — ABNORMAL HIGH (ref 11.5–15.5)
WBC: 4.4 10*3/uL (ref 4.0–10.5)
nRBC: 0 % (ref 0.0–0.2)

## 2023-01-20 LAB — COMPREHENSIVE METABOLIC PANEL
ALT: 172 U/L — ABNORMAL HIGH (ref 0–44)
AST: 287 U/L — ABNORMAL HIGH (ref 15–41)
Albumin: 2.9 g/dL — ABNORMAL LOW (ref 3.5–5.0)
Alkaline Phosphatase: 134 U/L — ABNORMAL HIGH (ref 38–126)
Anion gap: 10 (ref 5–15)
BUN: 11 mg/dL (ref 6–20)
CO2: 21 mmol/L — ABNORMAL LOW (ref 22–32)
Calcium: 8.1 mg/dL — ABNORMAL LOW (ref 8.9–10.3)
Chloride: 106 mmol/L (ref 98–111)
Creatinine, Ser: 0.72 mg/dL (ref 0.44–1.00)
GFR, Estimated: >60 mL/min (ref >60)
Glucose, Bld: 105 mg/dL — ABNORMAL HIGH (ref 70–99)
Potassium: 3.5 mmol/L (ref 3.5–5.1)
Sodium: 137 mmol/L (ref 135–145)
Total Bilirubin: 0.5 mg/dL (ref 0.3–1.2)
Total Protein: 5.9 g/dL — ABNORMAL LOW (ref 6.5–8.1)

## 2023-01-20 LAB — PROTIME-INR
INR: 0.9 (ref 0.8–1.2)
Prothrombin Time: 12.6 seconds (ref 11.4–15.2)

## 2023-01-20 LAB — LIPASE, BLOOD: Lipase: 19 U/L (ref 11–51)

## 2023-01-20 LAB — I-STAT BETA HCG BLOOD, ED (MC, WL, AP ONLY): I-stat hCG, quantitative: <5 m[IU]/mL (ref <5)

## 2023-01-20 LAB — ACETAMINOPHEN LEVEL: Acetaminophen (Tylenol), Serum: <10 ug/mL — ABNORMAL LOW (ref 10–30)

## 2023-01-20 MED ORDER — IOHEXOL 350 MG/ML SOLN
75.0000 mL | Freq: Once | INTRAVENOUS | Status: AC | PRN
  Administered 2023-01-20: 75 mL via INTRAVENOUS

## 2023-01-20 NOTE — ED Notes (Signed)
Pt  declines labwork at this time, since her labs were drawn yesterday.

## 2023-01-20 NOTE — Discharge Instructions (Signed)
You were seen in the emergency department for your vomiting and diarrhea with abnormal liver tests.  Your workup here today does show that you have fatty liver.  Your liver enzymes are improved compared to yesterday but still elevated and you still have low platelets which is likely related to your fatty liver.  You can follow-up with your primary doctor to have your labs rechecked and by the gastroenterologist for further management of your fatty liver.  You should try to limit your Tylenol and alcohol intake and try to sustain a low fat diet to help prevent worsening disease.  You should return to the emergency department if you notice yellowing of your eyes or skin, you vomit blood or have black or bloody stools, you have significantly worsening pain or if you have any other new or concerning symptoms.

## 2023-01-20 NOTE — ED Triage Notes (Signed)
Pt in with n/v/d for 3 mos, states she has lost about 40lbs during this time. Pt went to UC yesterday, and had labs done showing elevated liver enzymes and low Plts. Pt currently still having abdominal cramps.

## 2023-01-20 NOTE — ED Provider Notes (Signed)
[place] EMERGENCY DEPARTMENT AT [facility] Provider Note   CSN: [id number] Arrival date & time: 01/20/23  4098     History  Chief Complaint  Patient presents with   Emesis   Diarrhea    Cheryl Holloway is a 47 y.o. female.  Patient is a 47 year old female with a past medical history of breast cancer several years ago status postchemotherapy presenting to the emergency department with nausea, vomiting and diarrhea.  She states that her symptoms have been ongoing for the last several months.  She states that she still has an appetite but every time she eats she either vomits or has diarrhea.  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  She states that she was seen at urgent care yesterday who performed labs and was called this morning to inform her that her liver enzymes and platelets were abnormal and that she should come to the emergency department for further evaluation.  Patient states that she has had approximately 40 pound weight loss in the last few months.  She also endorses night sweats though she attributed those to hot flashes after her breast cancer treatment.  She denies any fevers or chills.  She states that she has associated epigastric abdominal pain when she tries to eat.  She denies any black or bloody stools.  She states that she has been taking about 4 tabs of Tylenol per day for pain.  She denies any alcohol or drug use.  The history is provided by the patient.  Emesis Associated symptoms: diarrhea   Diarrhea Associated symptoms: vomiting        Home Medications Prior to Admission medications   Medication Sig Start Date End Date Taking? Authorizing Provider  dicyclomine (BENTYL) 10 MG capsule Take 1 capsule (10 mg total) by mouth 3 (three) times daily before meals. 01/19/23   Raspet, Erin K, PA-C  Fiber Adult Gummies 2 g CHEW Chew 1 tablet by mouth daily.     [provider]  Multiple Vitamin (MULTIVITAMIN ADULT PO) Take 1 tablet by mouth daily. prenatel    [provider]  ondansetron (ZOFRAN ODT) 4 MG disintegrating tablet Take 1 tablet (4 mg total) by mouth every 8 (eight) hours as needed. 01/19/23   Raspet, Noberto Retort, PA-C  oxyCODONE (OXY IR/ROXICODONE) 5 MG immediate release tablet Take 1-2 tablets (5-10 mg total) by mouth every 4 (four) hours as needed for moderate pain or severe pain. 04/14/21   Yolonda Kida, MD      Allergies    Ibuprofen    Review of Systems   Review of Systems  Gastrointestinal:  Positive for diarrhea and vomiting.    Physical Exam Updated Vital Signs BP (!) 149/97 (BP Location: Right Arm)   Pulse 66   Temp 98.9 F (37.2 C) (Oral)   Resp 18   Wt 86.8 kg   LMP 04/07/2020 (Approximate)   SpO2 100%   BMI 31.85 kg/m  Physical Exam Vitals and nursing note reviewed.  Constitutional:      General: She is not in acute distress.    Appearance: Normal appearance.  HENT:     Head: Normocephalic and atraumatic.     Nose: Nose normal.     Mouth/Throat:     Mouth: Mucous membranes are moist.     Pharynx: Oropharynx is clear.  Eyes:     General: No scleral icterus.  Extraocular Movements: Extraocular movements intact.     Conjunctiva/sclera: Conjunctivae normal.  Cardiovascular:     Rate and Rhythm: Normal rate and regular rhythm.     Heart sounds: Normal heart sounds.  Pulmonary:     Effort: Pulmonary effort is normal.     Breath sounds: Normal breath sounds.  Abdominal:     General: Abdomen is flat.     Palpations: Abdomen is soft.     Tenderness: There is abdominal tenderness (Minimal epigastric). There is no guarding or rebound.  Musculoskeletal:        General: Normal range of motion.     Cervical back: Normal range of motion and neck supple.  Skin:    General: Skin is warm and dry.  Neurological:     General: No focal deficit present.     Mental Status: She is alert and  oriented to person, place, and time.  Psychiatric:        Mood and Affect: Mood normal.        Behavior: Behavior normal.     ED Results / Procedures / Treatments   Labs (all labs ordered are listed, but only abnormal results are displayed) Labs Reviewed  COMPREHENSIVE METABOLIC PANEL - Abnormal; Notable for the following components:      Result Value   CO2 21 (*)    Glucose, Bld 105 (*)    Calcium 8.1 (*)    Total Protein 5.9 (*)    Albumin 2.9 (*)    AST 287 (*)    ALT 172 (*)    Alkaline Phosphatase 134 (*)    All other components within normal limits  CBC WITH DIFFERENTIAL/PLATELET - Abnormal; Notable for the following components:   RBC 3.77 (*)    Hemoglobin 10.5 (*)    HCT 33.7 (*)    RDW 17.4 (*)    Platelets 83 (*)    All other components within normal limits  ACETAMINOPHEN LEVEL - Abnormal; Notable for the following components:   Acetaminophen (Tylenol), Serum <10 (*)    All other components within normal limits  I-STAT CHEM 8, ED - Abnormal; Notable for the following components:   Glucose, Bld 103 (*)    Calcium, Ion 1.07 (*)    Hemoglobin 11.6 (*)    HCT 34.0 (*)    All other components within normal limits  LIPASE, BLOOD  PROTIME-INR  I-STAT BETA HCG BLOOD, ED ([facility], [facility], [facility] ONLY)    EKG None  Radiology CT ABDOMEN PELVIS W CONTRAST  Result Date: 01/20/2023 CLINICAL DATA:  Flank pain. Epigastric pain nausea vomiting and diarrhea. Weight loss. History of breast cancer. EXAM: CT ABDOMEN AND PELVIS WITH CONTRAST TECHNIQUE: Multidetector CT imaging of the abdomen and pelvis was performed using the standard protocol following bolus administration of intravenous contrast. RADIATION DOSE REDUCTION: This exam was performed according to the departmental dose-optimization program which includes automated exposure control, adjustment of the mA and/or kV according to patient size and/or use of iterative reconstruction technique. CONTRAST:  75mL OMNIPAQUE IOHEXOL 350 MG/ML  SOLN COMPARISON:  Pelvic ultrasound 06/12/2020. Abdominal ultrasound 10/28/2015. Old CT 01/15/2014 FINDINGS: Lower chest: No pleural effusion lung bases. Stable 4 mm right lower lobe lung nodule on series 7 image 1 going back to a CT of 2015 demonstrating long-term stability. No additional follow-up. Similar focus subpleural lateral on series 7, image 27. Also long-term stability. Hepatobiliary: Severe fatty liver infiltration. Gallbladder is present. Patent portal vein. Pancreas: Moderate atrophy of the pancreas.  No mass  lesion. Spleen: Normal in size without focal abnormality. Adrenals/Urinary Tract: Adrenal glands are preserved. No enhancing renal mass. Lobular contours of the kidneys. Duplicated right renal collecting system. Preserved contours of the urinary bladder. Stomach/Bowel: No oral contrast. Prior gastric bypass with a small gastric component of the gastrojejunostomy. No dilatation of the stomach, small bowel or large bowel. There is moderate diffuse colonic stool. Redundant course seen the colon in the sigmoid region. Normal retrocecal appendix in the right hemipelvis. Several fluid-filled loops of small bowel. Vascular/Lymphatic: No significant vascular findings are present. No enlarged abdominal or pelvic lymph nodes. Reproductive: Status post hysterectomy. No adnexal masses. Other: No free air or free fluid. Musculoskeletal: No acute or significant osseous findings. IMPRESSION: Severe fatty liver infiltration. No bowel obstruction, free air or free fluid. Moderate colonic stool. Normal caliber appendix. Electronically Signed   By: Karen Kays M.D.   On: 01/20/2023 11:26   DG Chest 2 View  Result Date: 01/19/2023 CLINICAL DATA:  Weight loss EXAM: CHEST - 2 VIEW COMPARISON:  Chest x-ray dated April 09, 2019 FINDINGS: The heart size and mediastinal contours are within normal limits. Both lungs are clear. The visualized skeletal structures are unremarkable. IMPRESSION: No active cardiopulmonary  disease. Electronically Signed   By: Allegra Lai M.D.   On: 01/19/2023 10:54    Procedures Procedures    Medications Ordered in ED Medications  iohexol (OMNIPAQUE) 350 MG/ML injection 75 mL (75 mLs Intravenous Contrast Given 01/20/23 1041)    ED Course/ Medical Decision Making/ A&P Clinical Course as of 01/20/23 1151  Thu Jan 20, 2023  1132 CTAP with severe fatty liver, LFTs downtrending. [VK]  1147 Upon reassessment, the patient is asleep in bed resting comfortably well-appearing in no acute distress.  She has had no vomiting in the emergency department and no recurrence of her abdominal pain.  She is stable for discharge home and will be given outpatient GI follow-up and was given strict return precautions. [VK]    Clinical Course User Index [VK] Rexford Maus, DO                             Medical Decision Making Look good this patient presents to the ED with chief complaint(s) of N/V/D, abnormal labs with pertinent past medical history of breast cancer in remission which further complicates the presenting complaint. The complaint involves an extensive differential diagnosis and also carries with it a high risk of complications and morbidity.    The differential diagnosis includes cholelithiasis, cholecystitis, hepatitis, Tylenol toxicity, malignancy, pancreatitis, dehydration, electrolyte abnormality  Additional history obtained: Additional history obtained from N/A Records reviewed urgent care records  ED Course and Reassessment: On patient's arrival to the emergency department she is hemodynamically stable in no acute distress.  I did review her labs from urgent care yesterday and she had new transaminitis with LFTs in the 500s as well as a platelets in the 90s compared to labs last done about a year ago.  The patient will have repeat labs performed today as well as imaging to evaluate for possible causes of her pain and symptoms and she will be closely  reassessed.  Independent labs interpretation:  The following labs were independently interpreted: downtrending LFTs, stable thrombocytopenia  Independent visualization of imaging: - I independently visualized the following imaging with scope of interpretation limited to determining acute life threatening conditions related to emergency care: CTAP, which revealed fatty liver  Consultation: - Consulted or  discussed management/test interpretation w/ external professional: N/A  Consideration for admission or further workup: Patient has no emergent conditions requiring admission or further work-up at this time and is stable for discharge home with primary care and GI follow-up  Social Determinants of health: N/A    Amount and/or Complexity of Data Reviewed Labs: ordered. Radiology: ordered.  Risk Prescription drug management.          Final Clinical Impression(s) / ED Diagnoses Final diagnoses:  Fatty liver  Transaminitis  Thrombocytopenia [facility])    Rx / DC Orders ED Discharge Orders     None         Rexford Maus, DO 01/20/23 1151

## 2023-01-21 ENCOUNTER — Inpatient Hospital Stay: Payer: Self-pay | Admitting: Nurse Practitioner

## 2023-02-14 DIAGNOSIS — Z419 Encounter for procedure for purposes other than remedying health state, unspecified: Secondary | ICD-10-CM | POA: Diagnosis not present

## 2023-03-08 ENCOUNTER — Encounter: Payer: Self-pay | Admitting: Family Medicine

## 2023-03-08 ENCOUNTER — Other Ambulatory Visit (HOSPITAL_COMMUNITY)
Admission: RE | Admit: 2023-03-08 | Discharge: 2023-03-08 | Disposition: A | Discharge status: Home / Self Care | Payer: Medicaid Other | Source: Ambulatory Visit | Attending: Family Medicine | Admitting: Family Medicine

## 2023-03-08 ENCOUNTER — Other Ambulatory Visit: Payer: Self-pay

## 2023-03-08 ENCOUNTER — Ambulatory Visit: Payer: Medicaid Other | Admitting: Family Medicine

## 2023-03-08 VITALS — BP 127/76 | HR 77 | Wt 187.3 lb

## 2023-03-08 DIAGNOSIS — Z124 Encounter for screening for malignant neoplasm of cervix: Secondary | ICD-10-CM

## 2023-03-08 DIAGNOSIS — Z01419 Encounter for gynecological examination (general) (routine) without abnormal findings: Secondary | ICD-10-CM | POA: Diagnosis not present

## 2023-03-08 DIAGNOSIS — Z9013 Acquired absence of bilateral breasts and nipples: Secondary | ICD-10-CM | POA: Diagnosis not present

## 2023-03-08 DIAGNOSIS — Z90711 Acquired absence of uterus with remaining cervical stump: Secondary | ICD-10-CM | POA: Diagnosis not present

## 2023-03-08 NOTE — Progress Notes (Signed)
GYNECOLOGY OFFICE VISIT NOTE  History:   Cheryl Holloway is a 47 y.o. [phone] here today for an annual exam. She denies any abnormal vaginal discharge, bleeding, pelvic pain or other concerns.  She has a history of breast cancer X 2, in the left breast; and has had a bilateral mastectomy.  She also had had a subtotal hysterectomy for symptomatic fibroids and AUB; cervix was left intact; and she will still benefit from routine pap smears.   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   History of kidney stones 1994   History of mastectomy 04/25/2014   Post-operative state 01/27/2014   2009     Ruptured right breast implant, initial encounter 02/22/2014   Upper gastrointestinal bleeding 01/01/2016    Past Surgical History:  Procedure Laterality Date   BREAST IMPLANT REMOVAL Right 04/25/2014   Procedure: REMOVAL BREAST IMPLANT WITH CAPSULECTOMY;  Surgeon: Wayland Denis, DO;  Location: [facility];  Service: Plastics;  Laterality: Right;   BREAST RECONSTRUCTION     BREAST REDUCTION SURGERY     CESAREAN SECTION     x3   CYSTOSCOPY N/A 10/07/2020   Procedure: CYSTOSCOPY;  Surgeon: Adam Phenix, MD;  Location: [facility] OR;  Service: Gynecology;  Laterality: N/A;   DILATION AND CURETTAGE OF UTERUS  1993   ESOPHAGOGASTRODUODENOSCOPY N/A 01/02/2016   Procedure: ESOPHAGOGASTRODUODENOSCOPY (EGD);  Surgeon: Vida Rigger, MD;  Location: [facility] ENDOSCOPY;  Service: Endoscopy;  Laterality: N/A;   GASTRIC BYPASS     LAPAROSCOPY N/A 10/07/2020   Procedure: LAPAROSCOPY DIAGNOSTIC;  Surgeon: Adam Phenix, MD;  Location: [facility] OR;  Service: Gynecology;  Laterality: N/A;   MASTECTOMY     right   ORIF ANKLE FRACTURE Right 04/14/2021   Procedure: OPEN REDUCTION INTERNAL FIXATION (ORIF) RIGHT ANKLE  FRACTURE WITH SYNDESMOTIC FIXATION;  Surgeon: Yolonda Kida, MD;  Location: [facility] OR;  Service: Orthopedics;  Laterality: Right;   SIMPLE MASTECTOMY WITH AXILLARY SENTINEL NODE BIOPSY Left 04/25/2014   Procedure: LEFT TOTAL  MASTECTOMY (PROPHYLACTIC);  Surgeon: Emelia Loron, MD;  Location: [facility];  Service: General;  Laterality: Left;   SUPRACERVICAL ABDOMINAL HYSTERECTOMY N/A 10/07/2020   Procedure: HYSTERECTOMY SUPRACERVICAL ABDOMINAL;  Surgeon: Adam Phenix, MD;  Location: [facility] OR;  Service: Gynecology;  Laterality: N/A;   TUBAL LIGATION     UNILATERAL SALPINGECTOMY Right 10/07/2020   Procedure: UNILATERAL SALPINGECTOMY;  Surgeon: Adam Phenix, MD;  Location: [facility] OR;  Service: Gynecology;  Laterality: Right;    The following portions of the patient's history were reviewed and updated as appropriate: allergies, current medications, past family history, past medical history, past social history, past surgical history and problem list.   Health Maintenance:  Normal pap and negative HRHPV in 2021.Marland Kitchen   Review of Systems:  Pertinent items noted in HPI and remainder of comprehensive ROS otherwise negative.  Physical Exam:  BP 127/76   Pulse 77   Wt 187 lb 4.8 oz (85 kg)   LMP 04/07/2020 (Approximate)   BMI 31.17 kg/m  CONSTITUTIONAL: Well-developed, well-nourished female in no acute distress.  HEENT:  Normocephalic, atraumatic. External right and left ear normal. No scleral icterus.  NECK: Normal range of motion, supple, no masses noted on observation SKIN: No rash noted.  Not diaphoretic. No erythema. No pallor. MUSCULOSKELETAL: Normal range of motion. No edema noted. NEUROLOGIC: Alert and oriented to person, place, and time. Normal muscle tone coordination. No cranial nerve deficit noted. PSYCHIATRIC: Normal mood and affect. Normal behavior. Normal judgment and thought content. CARDIOVASCULAR: Normal heart rate noted RESPIRATORY: Effort and breath sounds  normal, no problems with respiration noted ABDOMEN: No masses noted. No other overt distention noted.   PELVIC: Normal appearing external genitalia; normal urethral meatus; normal appearing vaginal mucosa. Cervix appears stenotic.  No abnormal discharge noted. no other palpable masses, no uterine or adnexal tenderness. Performed in the presence of a chaperone  Labs and Imaging No results found for this or any previous visit (from the past 168 hour(s)). No results found.    Assessment and Plan:  Encounter for annual routine gynecological examination Cervical cancer screening Pap smear collected; + HPV. If normal recommended next screening in 5 years.  Status post bilateral mastectomy Doing well. S/p treatment and is in remission.  S/P abdominal supracervical subtotal hysterectomy Discussed need to continue pap smears until at least age 23; when a risk factor discussion can be had  Routine preventative health maintenance measures emphasized. Please refer to After Visit Summary for other counseling recommendations.   No follow-ups on file.    I spent 20 minutes dedicated to the care of this patient including pre-visit review of records, face to face time with the patient discussing her conditions and treatments and post visit orders.  Sheppard Evens MD MPH OB Fellow, Faculty Practice [facility], Center for [facility] 03/08/2023

## 2023-03-10 LAB — CYTOLOGY - PAP
Comment: NEGATIVE
Diagnosis: NEGATIVE
High risk HPV: NEGATIVE

## 2023-03-17 DIAGNOSIS — Z419 Encounter for procedure for purposes other than remedying health state, unspecified: Secondary | ICD-10-CM | POA: Diagnosis not present

## 2023-03-31 ENCOUNTER — Ambulatory Visit: Payer: Medicaid Other | Admitting: Family Medicine

## 2023-04-07 ENCOUNTER — Ambulatory Visit: Payer: Medicaid Other | Admitting: Nurse Practitioner

## 2023-04-17 DIAGNOSIS — Z419 Encounter for procedure for purposes other than remedying health state, unspecified: Secondary | ICD-10-CM | POA: Diagnosis not present

## 2023-04-28 ENCOUNTER — Ambulatory Visit (HOSPITAL_COMMUNITY)
Admission: EM | Admit: 2023-04-28 | Discharge: 2023-04-28 | Disposition: A | Discharge status: Home / Self Care | Payer: Medicaid Other | Attending: Emergency Medicine | Admitting: Emergency Medicine

## 2023-04-28 ENCOUNTER — Encounter (HOSPITAL_COMMUNITY): Payer: Self-pay

## 2023-04-28 DIAGNOSIS — R55 Syncope and collapse: Secondary | ICD-10-CM | POA: Diagnosis not present

## 2023-04-28 LAB — COMPREHENSIVE METABOLIC PANEL
ALT: 78 U/L — ABNORMAL HIGH (ref 0–44)
AST: 115 U/L — ABNORMAL HIGH (ref 15–41)
Albumin: 3.7 g/dL (ref 3.5–5.0)
Alkaline Phosphatase: 119 U/L (ref 38–126)
Anion gap: 10 (ref 5–15)
BUN: 10 mg/dL (ref 6–20)
CO2: 24 mmol/L (ref 22–32)
Calcium: 9.3 mg/dL (ref 8.9–10.3)
Chloride: 102 mmol/L (ref 98–111)
Creatinine, Ser: 0.93 mg/dL (ref 0.44–1.00)
GFR, Estimated: >60 mL/min (ref >60)
Glucose, Bld: 160 mg/dL — ABNORMAL HIGH (ref 70–99)
Potassium: 4.2 mmol/L (ref 3.5–5.1)
Sodium: 136 mmol/L (ref 135–145)
Total Bilirubin: 1.2 mg/dL (ref 0.3–1.2)
Total Protein: 7.1 g/dL (ref 6.5–8.1)

## 2023-04-28 LAB — POCT URINALYSIS DIP (MANUAL ENTRY)
Blood, UA: NEGATIVE
Glucose, UA: NEGATIVE mg/dL
Leukocytes, UA: NEGATIVE
Nitrite, UA: NEGATIVE
Protein Ur, POC: NEGATIVE mg/dL
Spec Grav, UA: 1.025 (ref 1.010–1.025)
Urobilinogen, UA: 0.2 U/dL
pH, UA: 5.5 (ref 5.0–8.0)

## 2023-04-28 LAB — CBC
HCT: 37.8 % (ref 36.0–46.0)
Hemoglobin: 12.1 g/dL (ref 12.0–15.0)
MCH: 27.1 pg (ref 26.0–34.0)
MCHC: 32 g/dL (ref 30.0–36.0)
MCV: 84.8 fL (ref 80.0–100.0)
Platelets: 116 10*3/uL — ABNORMAL LOW (ref 150–400)
RBC: 4.46 MIL/uL (ref 3.87–5.11)
RDW: 15.8 % — ABNORMAL HIGH (ref 11.5–15.5)
WBC: 5.5 10*3/uL (ref 4.0–10.5)
nRBC: 0 % (ref 0.0–0.2)

## 2023-04-28 LAB — POCT FASTING CBG KUC MANUAL ENTRY: POCT Glucose (KUC): 163 mg/dL — AB (ref 70–99)

## 2023-04-28 NOTE — ED Provider Notes (Signed)
[facility]    CSN: [id number] Arrival date & time: 04/28/23  0815     History   Chief Complaint Chief Complaint  Patient presents with   Headache   Fatigue    HPI Cheryl Holloway is a 47 y.o. female.  Standing in line today when she started to feel faint, dizzy, weak Also was nauseous in that moment. No emesis.  Felt better after she sat down Still has some lingering mild headache and feels "woozy" but overall symptoms resolved Not having vision changes. No chest pain or shortness of breath. She ate a little this morning and has been drinking water  Denies fever, chills, congestion, cough No history of this  S/p hysterectomy in 2021 Breast cancer in remission  Anemia, takes iron sup every other day  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   History of kidney stones 1994   History of mastectomy 04/25/2014   Malignant neoplasm of female breast (HCC) 09/30/2015   Post-operative state 01/27/2014   2009     Ruptured right breast implant, initial encounter 02/22/2014   Upper gastrointestinal bleeding 01/01/2016    Patient Active Problem List   Diagnosis Date Noted   S/P abdominal supracervical subtotal hysterectomy 03/08/2023   Steatosis of liver 01/01/2016   S/P mastectomy 04/25/2014   Breast cancer, right (HCC) 01/27/2014   Gastric bypass status for obesity 01/27/2014   Iron deficiency anemia due to chronic blood loss 01/27/2014   Breast CA (HCC) 01/27/2014    Past Surgical History:  Procedure Laterality Date   BREAST IMPLANT REMOVAL Right 04/25/2014   Procedure: REMOVAL BREAST IMPLANT WITH CAPSULECTOMY;  Surgeon: Wayland Denis, DO;  Location: [facility];  Service: Plastics;  Laterality: Right;   BREAST RECONSTRUCTION     BREAST  REDUCTION SURGERY     CESAREAN SECTION     x3   CYSTOSCOPY N/A 10/07/2020   Procedure: CYSTOSCOPY;  Surgeon: Adam Phenix, MD;  Location: [facility] OR;  Service: Gynecology;  Laterality: N/A;   DILATION AND CURETTAGE OF UTERUS  1993   ESOPHAGOGASTRODUODENOSCOPY N/A 01/02/2016   Procedure: ESOPHAGOGASTRODUODENOSCOPY (EGD);  Surgeon: Vida Rigger, MD;  Location: [facility] END[facility]COPY;  Service: Endoscopy;  Laterality: N/A;   GASTRIC BYPASS     LAPAROSCOPY N/A 10/07/2020   Procedure: LAPAROSCOPY DIAGNOSTIC;  Surgeon: Adam Phenix, MD;  Location: [facility] OR;  Service: Gynecology;  Laterality: N/A;   MASTECTOMY     right   ORIF ANKLE FRACTURE Right 04/14/2021   Procedure: OPEN REDUCTION INTERNAL FIXATION (ORIF) RIGHT ANKLE FRACTURE WITH SYNDESMOTIC FIXATION;  Surgeon: Yolonda Kida, MD;  Location: [facility] OR;  Service: Orthopedics;  Laterality: Right;   SIMPLE MASTECTOMY WITH AXILLARY SENTINEL NODE BIOPSY Left 04/25/2014   Procedure: LEFT TOTAL  MASTECTOMY (PROPHYLACTIC);  Surgeon: Emelia Loron, MD;  Location:  SURGERY CENTER;  Service: General;  Laterality: Left;   SUPRACERVICAL ABDOMINAL HYSTERECTOMY N/A 10/07/2020   Procedure: HYSTERECTOMY SUPRACERVICAL ABDOMINAL;  Surgeon: Adam Phenix, MD;  Location: [facility] OR;  Service: Gynecology;  Laterality: N/A;   TUBAL LIGATION     UNILATERAL SALPINGECTOMY Right 10/07/2020   Procedure: UNILATERAL SALPINGECTOMY;  Surgeon: Adam Phenix, MD;  Location: [facility] OR;  Service: Gynecology;  Laterality: Right;    OB  History     Gravida  4   Para  3   Term  3   Preterm      AB  1   Living  3      SAB  1   IAB      Ectopic      Multiple      Live Births  3            Home Medications    Prior to Admission medications   Medication Sig Start Date End Date Taking? Authorizing Provider  Fiber Adult Gummies 2 g CHEW Chew 1 tablet by mouth daily.   Yes [provider]  Multiple Vitamin (MULTIVITAMIN ADULT PO) Take 1 tablet by mouth  daily. prenatel   Yes [provider]    Family History Family History  Problem Relation Age of Onset   Healthy Mother     Social History Social History   Tobacco Use   Smoking status: Never   Smokeless tobacco: Never  Vaping Use   Vaping status: Never Used  Substance Use Topics   Alcohol use: Not Currently    Comment: social   Drug use: No     Allergies   Ibuprofen   Review of Systems Review of Systems As per HPI  Physical Exam Triage Vital Signs ED Triage Vitals  Encounter Vitals Group     BP 04/28/23 0845 115/80     Systolic BP Percentile --      Diastolic BP Percentile --      Pulse Rate 04/28/23 0845 67     Resp 04/28/23 0845 18     Temp 04/28/23 0845 98 F (36.7 C)     Temp Source 04/28/23 0845 Oral     SpO2 04/28/23 0845 97 %     Weight 04/28/23 0845 200 lb (90.7 kg)     Height 04/28/23 0845 5\' 5"  (1.651 m)     Head Circumference --      Peak Flow --      Pain Score 04/28/23 0843 0     Pain Loc --      Pain Education --      Exclude from Growth Chart --    No data found.  Updated Vital Signs BP 115/80 (BP Location: Left Arm)   Pulse 67   Temp 98 F (36.7 C) (Oral)   Resp 18   Ht 5\' 5"  (1.651 m)   Wt 200 lb (90.7 kg)   LMP 04/07/2020 (Approximate)   SpO2 97%   BMI 33.28 kg/m   Physical Exam Vitals and nursing note reviewed.  Constitutional:      General: She is not in acute distress.    Appearance: She is not ill-appearing or diaphoretic.  HENT:     Head: Atraumatic.     Right Ear: Tympanic membrane and ear canal normal.     Left Ear: Tympanic membrane and ear canal normal.     Mouth/Throat:     Mouth: Mucous membranes are moist.     Pharynx: Oropharynx is clear. No posterior oropharyngeal erythema.  Eyes:     Extraocular Movements: Extraocular movements intact.     Conjunctiva/sclera: Conjunctivae normal.     Pupils: Pupils are equal, round, and reactive to light.  Cardiovascular:     Rate and Rhythm: Normal rate  and regular rhythm.     Pulses: Normal pulses.     Heart sounds: Normal heart sounds.  Pulmonary:     Effort:  Pulmonary effort is normal.     Breath sounds: Normal breath sounds.  Abdominal:     Palpations: Abdomen is soft.     Tenderness: There is no abdominal tenderness.  Musculoskeletal:        General: Normal range of motion.     Cervical back: Normal range of motion.  Skin:    General: Skin is warm and dry.     Coloration: Skin is not pale.     Findings: No rash.  Neurological:     General: No focal deficit present.     Mental Status: She is alert and oriented to person, place, and time.     Cranial Nerves: Cranial nerves 2-12 are intact. No cranial nerve deficit.     Sensory: Sensation is intact.     Motor: Motor function is intact. No weakness, tremor or seizure activity.     Coordination: Coordination is intact. Finger-Nose-Finger Test normal.     Gait: Gait is intact.     Comments: Strength and sensation intact throughout. Strong pulses. Normal finger to nose, steady gait      UC Treatments / Results  Labs (all labs ordered are listed, but only abnormal results are displayed) Labs Reviewed  CBC - Abnormal; Notable for the following components:      Result Value   RDW 15.8 (*)    Platelets 116 (*)    All other components within normal limits  COMPREHENSIVE METABOLIC PANEL - Abnormal; Notable for the following components:   Glucose, Bld 160 (*)    AST 115 (*)    ALT 78 (*)    All other components within normal limits  POCT FASTING CBG KUC MANUAL ENTRY - Abnormal; Notable for the following components:   POCT Glucose (KUC) 163 (*)    All other components within normal limits  POCT URINALYSIS DIP (MANUAL ENTRY) - Abnormal; Notable for the following components:   Clarity, UA cloudy (*)    Bilirubin, UA small (*)    Ketones, POC UA trace (5) (*)    All other components within normal limits    EKG  Radiology No results  found.  Procedures Procedures  Medications Ordered in UC Medications - No data to display  Initial Impression / Assessment and Plan / UC Course  I have reviewed the triage vital signs and the nursing notes.  Pertinent labs & imaging results that were available during my care of the patient were reviewed by me and considered in my medical decision making (see chart for details).  Afebrile, stable vitals CBG 163 UA trace ketones, small bili  CBC low platelets but improved from 3 months ago CMP slight elevation in AST/ALT, also improved Patient declined covid test today  She is well appearing, stable physical and neurological exam No red flags at this time Discussed possible etiologies; consider vasovagal, menopausal, metabolic. Reassurance provided given stable vitals and good exam. Strict return and ED precautions, follow up with primary care provider. Patient agreeable to plan. All questions answered.  Final Clinical Impressions(s) / UC Diagnoses   Final diagnoses:  Pre-syncope  Vasovagal near syncope     Discharge Instructions      I will call you if anything results abnormal on your lab work.  In the meantime keep drinking lots of fluids, eating small meals, resting throughout the day.  Your vitals and exam are very reassuring. I wonder if you had some sort of vasovagal response while standing in line, or menopausal symptoms, or other issue  such as dehydration. The good news is your symptoms are improving.  I would like you to keep an eye on yourself; if anything worsens or become severe, go directly to the emergency department     ED Prescriptions   None    PDMP not reviewed this encounter.   Aprille Sawhney, Ray Church 04/28/23 1211

## 2023-04-28 NOTE — ED Triage Notes (Signed)
Dizziness; Headache; Weakness, sweats onset today while in line at the [facility]. Patient stated she felt faint. No diabetes or fainting. Felt nauseous in that moment.  No one with similar symptoms or sick, no medication changes, has started a healthier diet.

## 2023-04-28 NOTE — Discharge Instructions (Addendum)
I will call you if anything results abnormal on your lab work.  In the meantime keep drinking lots of fluids, eating small meals, resting throughout the day.  Your vitals and exam are very reassuring. I wonder if you had some sort of vasovagal response while standing in line, or menopausal symptoms, or other issue such as dehydration. The good news is your symptoms are improving.  I would like you to keep an eye on yourself; if anything worsens or become severe, go directly to the emergency department

## 2023-05-17 DIAGNOSIS — Z419 Encounter for procedure for purposes other than remedying health state, unspecified: Secondary | ICD-10-CM | POA: Diagnosis not present

## 2023-05-25 ENCOUNTER — Ambulatory Visit: Payer: Medicaid Other | Admitting: Family Medicine

## 2023-05-26 ENCOUNTER — Ambulatory Visit: Payer: Medicaid Other | Admitting: Nurse Practitioner

## 2023-07-04 ENCOUNTER — Encounter (HOSPITAL_COMMUNITY): Payer: Self-pay | Admitting: Emergency Medicine

## 2023-07-04 ENCOUNTER — Emergency Department (HOSPITAL_COMMUNITY): Payer: Medicaid Other

## 2023-07-04 ENCOUNTER — Emergency Department (HOSPITAL_COMMUNITY)
Admission: EM | Admit: 2023-07-04 | Discharge: 2023-07-04 | Disposition: A | Discharge status: Home / Self Care | Payer: Medicaid Other | Attending: Emergency Medicine | Admitting: Emergency Medicine

## 2023-07-04 ENCOUNTER — Other Ambulatory Visit: Payer: Self-pay

## 2023-07-04 DIAGNOSIS — R079 Chest pain, unspecified: Secondary | ICD-10-CM | POA: Diagnosis present

## 2023-07-04 DIAGNOSIS — R0602 Shortness of breath: Secondary | ICD-10-CM | POA: Diagnosis not present

## 2023-07-04 DIAGNOSIS — R0789 Other chest pain: Secondary | ICD-10-CM | POA: Insufficient documentation

## 2023-07-04 DIAGNOSIS — R61 Generalized hyperhidrosis: Secondary | ICD-10-CM | POA: Diagnosis not present

## 2023-07-04 DIAGNOSIS — R002 Palpitations: Secondary | ICD-10-CM | POA: Insufficient documentation

## 2023-07-04 DIAGNOSIS — R42 Dizziness and giddiness: Secondary | ICD-10-CM | POA: Diagnosis not present

## 2023-07-04 LAB — CBC
HCT: 36.4 % (ref 36.0–46.0)
Hemoglobin: 11.4 g/dL — ABNORMAL LOW (ref 12.0–15.0)
MCH: 26.3 pg (ref 26.0–34.0)
MCHC: 31.3 g/dL (ref 30.0–36.0)
MCV: 84.1 fL (ref 80.0–100.0)
Platelets: 350 10*3/uL (ref 150–400)
RBC: 4.33 MIL/uL (ref 3.87–5.11)
RDW: 17.7 % — ABNORMAL HIGH (ref 11.5–15.5)
WBC: 5.7 10*3/uL (ref 4.0–10.5)
nRBC: 0 % (ref 0.0–0.2)

## 2023-07-04 LAB — BASIC METABOLIC PANEL
Anion gap: 13 (ref 5–15)
BUN: 9 mg/dL (ref 6–20)
CO2: 23 mmol/L (ref 22–32)
Calcium: 8.8 mg/dL — ABNORMAL LOW (ref 8.9–10.3)
Chloride: 105 mmol/L (ref 98–111)
Creatinine, Ser: 0.8 mg/dL (ref 0.44–1.00)
GFR, Estimated: >60 mL/min (ref >60)
Glucose, Bld: 154 mg/dL — ABNORMAL HIGH (ref 70–99)
Potassium: 4.1 mmol/L (ref 3.5–5.1)
Sodium: 141 mmol/L (ref 135–145)

## 2023-07-04 LAB — TROPONIN I (HIGH SENSITIVITY)
Troponin I (High Sensitivity): 6 ng/L (ref <18)
Troponin I (High Sensitivity): 6 ng/L (ref <18)

## 2023-07-04 LAB — D-DIMER, QUANTITATIVE: D-Dimer, Quant: 0.4 ug{FEU}/mL (ref 0.00–0.50)

## 2023-07-04 NOTE — ED Triage Notes (Signed)
Pt reports while driving she had 1 sharp pain in the center of her chest. Pt reports she became [facility]. Pt reports she is having no pain or SHOB at this time.

## 2023-07-04 NOTE — ED Notes (Signed)
Patient transported to X-ray 

## 2023-07-04 NOTE — Discharge Instructions (Addendum)
We saw you in the ER for the chest pain/shortness of breath/palpitations. All of our cardiac workup is normal and your screening for blood clot in the lungs is also normal.  We are not sure what is causing your discomfort, but we feel comfortable sending you home at this time. The workup in the ER is not complete, and you should follow up with your primary care doctor for further evaluation.  Please return to the ER if you have worsening chest pain, shortness of breath, pain radiating to your jaw, shoulder, or back, sweats or fainting. Otherwise see the Cardiologist or your primary care doctor as requested.

## 2023-07-04 NOTE — ED Provider Notes (Signed)
[place] EMERGENCY DEPARTMENT AT [facility] Provider Note   CSN: [id number] Arrival date & time: 07/04/23  1043     History {Add pertinent medical, surgical, social history, OB history to HPI:1} Chief Complaint  Patient presents with   Shortness of Breath    Cheryl Holloway is a 47 y.o. female.  Patient presents with sudden onset sharp chest pain mild shortness of breath while driving.  Patient said a few brief episodes of this similar in the past.  Patient denies any known history of blood clots, heart attack or stents.  No recent cardiac workup.  Patient has history of breast cancer, no active treatment this time.  Anemia history on iron.  The history is provided by the patient.  Shortness of Breath Associated symptoms: chest pain   Associated symptoms: no abdominal pain, no fever, no headaches, no neck pain, no rash and no vomiting        Home Medications Prior to Admission medications   Medication Sig Start Date End Date Taking? Authorizing Provider  Fiber Adult Gummies 2 g CHEW Chew 1 tablet by mouth daily.    [provider]  Multiple Vitamin (MULTIVITAMIN ADULT PO) Take 1 tablet by mouth daily. prenatel    [provider]      Allergies    Ibuprofen    Review of Systems   Review of Systems  Constitutional:  Negative for chills and fever.  HENT:  Negative for congestion.   Eyes:  Negative for visual disturbance.  Respiratory:  Positive for shortness of breath.   Cardiovascular:  Positive for chest pain. Negative for leg swelling.  Gastrointestinal:  Negative for abdominal pain and vomiting.  Genitourinary:  Negative for dysuria and flank pain.  Musculoskeletal:  Negative for back pain, neck pain and neck stiffness.  Skin:  Negative for rash.  Neurological:  Negative for light-headedness and headaches.    Physical Exam Updated Vital Signs BP (!) 146/87 (BP Location: Right Arm)   Pulse 99   Temp 98 F (36.7 C)   Resp 18   LMP  04/07/2020 (Approximate)   SpO2 100%  Physical Exam Vitals and nursing note reviewed.  Constitutional:      General: She is not in acute distress.    Appearance: She is well-developed.  HENT:     Head: Normocephalic and atraumatic.     Mouth/Throat:     Mouth: Mucous membranes are moist.  Eyes:     General:        Right eye: No discharge.        Left eye: No discharge.     Conjunctiva/sclera: Conjunctivae normal.  Neck:     Trachea: No tracheal deviation.  Cardiovascular:     Rate and Rhythm: Normal rate and regular rhythm.     Heart sounds: No murmur heard. Pulmonary:     Effort: Pulmonary effort is normal.     Breath sounds: Normal breath sounds.  Abdominal:     General: There is no distension.     Palpations: Abdomen is soft.     Tenderness: There is no abdominal tenderness. There is no guarding.  Musculoskeletal:     Cervical back: Normal range of motion and neck supple. No rigidity.     Right lower leg: No edema.     Left lower leg: No edema.  Skin:    General: Skin is warm.     Capillary Refill: Capillary refill takes less than 2 seconds.     Findings:  No rash.  Neurological:     General: No focal deficit present.     Mental Status: She is alert.     Cranial Nerves: No cranial nerve deficit.  Psychiatric:        Mood and Affect: Mood normal.     ED Results / Procedures / Treatments   Labs (all labs ordered are listed, but only abnormal results are displayed) Labs Reviewed  BASIC METABOLIC PANEL - Abnormal; Notable for the following components:      Result Value   Glucose, Bld 154 (*)    Calcium 8.8 (*)    All other components within normal limits  CBC - Abnormal; Notable for the following components:   Hemoglobin 11.4 (*)    RDW 17.7 (*)    All other components within normal limits  D-DIMER, QUANTITATIVE  TROPONIN I (HIGH SENSITIVITY)  TROPONIN I (HIGH SENSITIVITY)    EKG EKG Interpretation Date/Time:  Monday July 04 2023 11:31:42  EST Ventricular Rate:  86 PR Interval:  160 QRS Duration:  88 QT Interval:  356 QTC Calculation: 426 R Axis:   51  Text Interpretation: Normal sinus rhythm Moderate voltage criteria for LVH, may be normal variant ( Sokolow-Lyon , Cornell product ) Borderline ECG When compared with ECG of 27-Mar-2016 17:12, PREVIOUS ECG IS PRESENT Confirmed by Blane Ohara [id number]) on 07/04/2023 1:11:26 PM  Radiology No results found.  Procedures Procedures  {Document cardiac monitor, telemetry assessment procedure when appropriate:1}  Medications Ordered in ED Medications - No data to display  ED Course/ Medical Decision Making/ A&P   {   Click here for ABCD2, HEART and other calculatorsREFRESH Note before signing :1}                              Medical Decision Making Amount and/or Complexity of Data Reviewed Labs: ordered. Radiology: ordered.   Patient presents after episode of acute chest pain shortness of breath discussed differential including atypical ACS, pulmonary embolism, pleuritis, reflux, other.  Patient has minimal symptoms at this time is well-appearing.  Plan for blood work, chest x-ray, troponins and D-dimer as she is lower risk.  Discussed follow-up with cardiology given recurrent palpitations and chest pain.  Discussed with husband who is with the patient.  {Document critical care time when appropriate:1} {Document review of labs and clinical decision tools ie heart score, Chads2Vasc2 etc:1}  {Document your independent review of radiology images, and any outside records:1} {Document your discussion with family members, caretakers, and with consultants:1} {Document social determinants of health affecting pt's care:1} {Document your decision making why or why not admission, treatments were needed:1} Final Clinical Impression(s) / ED Diagnoses Final diagnoses:  Acute chest pain  Heart palpitations    Rx / DC Orders ED Discharge Orders     None

## 2023-07-04 NOTE — ED Notes (Signed)
Pt updated on plan of care. Pt does report some chest discomfort at this time.

## 2023-07-04 NOTE — ED Provider Notes (Signed)
  Physical Exam  BP 137/83 (BP Location: Right Arm)   Pulse 98   Temp 98 F (36.7 C)   Resp 20   LMP 04/07/2020 (Approximate)   SpO2 99%   Physical Exam  Procedures  Procedures  ED Course / MDM    Medical Decision Making Amount and/or Complexity of Data Reviewed Labs: ordered. Radiology: ordered.   47 y/o F comes in with cc of chest pain. Hx of breast cancer,  gastric bypass. Sudden pain.  Dimer neg. Trops x 2. Pain is not GI type.  If trops neg - can d/c.       Derwood Kaplan, MD 07/04/23 [phone]

## 2023-07-17 DIAGNOSIS — Z419 Encounter for procedure for purposes other than remedying health state, unspecified: Secondary | ICD-10-CM | POA: Diagnosis not present

## 2023-08-04 DIAGNOSIS — R5383 Other fatigue: Secondary | ICD-10-CM | POA: Diagnosis not present

## 2023-08-04 DIAGNOSIS — R030 Elevated blood-pressure reading, without diagnosis of hypertension: Secondary | ICD-10-CM | POA: Diagnosis not present

## 2023-08-04 DIAGNOSIS — E559 Vitamin D deficiency, unspecified: Secondary | ICD-10-CM | POA: Diagnosis not present

## 2023-08-04 DIAGNOSIS — Z1159 Encounter for screening for other viral diseases: Secondary | ICD-10-CM | POA: Diagnosis not present

## 2023-08-04 DIAGNOSIS — Z6829 Body mass index (BMI) 29.0-29.9, adult: Secondary | ICD-10-CM | POA: Diagnosis not present

## 2023-08-04 DIAGNOSIS — K219 Gastro-esophageal reflux disease without esophagitis: Secondary | ICD-10-CM | POA: Diagnosis not present

## 2023-08-04 DIAGNOSIS — Z0000 Encounter for general adult medical examination without abnormal findings: Secondary | ICD-10-CM | POA: Diagnosis not present

## 2023-08-04 DIAGNOSIS — R9431 Abnormal electrocardiogram [ECG] [EKG]: Secondary | ICD-10-CM | POA: Diagnosis not present

## 2023-08-17 DIAGNOSIS — Z419 Encounter for procedure for purposes other than remedying health state, unspecified: Secondary | ICD-10-CM | POA: Diagnosis not present

## 2023-08-26 DIAGNOSIS — R748 Abnormal levels of other serum enzymes: Secondary | ICD-10-CM | POA: Diagnosis not present

## 2023-08-26 DIAGNOSIS — E6609 Other obesity due to excess calories: Secondary | ICD-10-CM | POA: Diagnosis not present

## 2023-08-26 DIAGNOSIS — Z6831 Body mass index (BMI) 31.0-31.9, adult: Secondary | ICD-10-CM | POA: Diagnosis not present

## 2023-08-26 DIAGNOSIS — E559 Vitamin D deficiency, unspecified: Secondary | ICD-10-CM | POA: Diagnosis not present

## 2023-09-17 DIAGNOSIS — Z419 Encounter for procedure for purposes other than remedying health state, unspecified: Secondary | ICD-10-CM | POA: Diagnosis not present

## 2023-09-20 ENCOUNTER — Ambulatory Visit (HOSPITAL_BASED_OUTPATIENT_CLINIC_OR_DEPARTMENT_OTHER): Payer: Medicaid Other | Admitting: Cardiology

## 2023-09-20 NOTE — Progress Notes (Deleted)
 Cardiology Office Note:    Date:  09/20/2023   ID:  Kendrick Moats, DOB 1975/09/05, MRN [id number]  PCP:  Patient, No Pcp Per  Cardiologist:  None  Electrophysiologist:  None   Referring MD: Charlyn Sora, MD   No chief complaint on file. ***  History of Present Illness:    Cheryl Holloway is a 48 y.o. female with a hx of breast cancer, obesity status post gastric bypass, iron deficiency anemia who presents as an ED follow-up for shortness of breath.  She was seen in the ED on 07/04/2023 with chest pain and shortness of breath.  Workup including troponins negative x 2.  D-dimer negative.  Echocardiogram in 2017 showed EF 60 to 65%, normal RV function, no significant valvular disease.  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   History of kidney stones 1994   History of mastectomy 04/25/2014   Malignant neoplasm of female breast (HCC) 09/30/2015   Post-operative state 01/27/2014   2009     Ruptured right breast implant, initial encounter 02/22/2014   Upper gastrointestinal bleeding 01/01/2016    Past Surgical History:  Procedure Laterality Date   BREAST IMPLANT REMOVAL Right 04/25/2014   Procedure: REMOVAL BREAST IMPLANT WITH CAPSULECTOMY;  Surgeon: Estefana Reichert, DO;  Location: [facility];  Service: Plastics;  Laterality: Right;   BREAST RECONSTRUCTION     BREAST REDUCTION SURGERY     CESAREAN SECTION     x3   CYSTOSCOPY N/A 10/07/2020   Procedure: CYSTOSCOPY;  Surgeon: Eveline Lynwood MATSU, MD;  Location: [facility] OR;  Service: Gynecology;  Laterality: N/A;   DILATION AND CURETTAGE OF UTERUS  1993   ESOPHAGOGASTRODUODENOSCOPY N/A 01/02/2016   Procedure: ESOPHAGOGASTRODUODENOSCOPY (EGD);  Surgeon: Oliva Boots, MD;  Location: [facility] ENDOSCOPY;  Service: Endoscopy;  Laterality:  N/A;   GASTRIC BYPASS     LAPAROSCOPY N/A 10/07/2020   Procedure: LAPAROSCOPY DIAGNOSTIC;  Surgeon: Eveline Lynwood MATSU, MD;  Location: [facility] OR;  Service: Gynecology;  Laterality: N/A;   MASTECTOMY     right   ORIF ANKLE FRACTURE Right 04/14/2021   Procedure: OPEN REDUCTION INTERNAL FIXATION (ORIF) RIGHT ANKLE FRACTURE WITH SYNDESMOTIC FIXATION;  Surgeon: Sharl Selinda Dover, MD;  Location: [facility] OR;  Service: Orthopedics;  Laterality: Right;   SIMPLE MASTECTOMY WITH AXILLARY SENTINEL NODE BIOPSY Left 04/25/2014   Procedure: LEFT TOTAL  MASTECTOMY (PROPHYLACTIC);  Surgeon: Donnice Bury, MD;  Location: [facility];  Service: General;  Laterality: Left;   SUPRACERVICAL ABDOMINAL HYSTERECTOMY N/A 10/07/2020   Procedure: HYSTERECTOMY SUPRACERVICAL ABDOMINAL;  Surgeon: Eveline Lynwood MATSU, MD;  Location: [facility] OR;  Service: Gynecology;  Laterality: N/A;   TUBAL LIGATION     UNILATERAL SALPINGECTOMY Right 10/07/2020   Procedure: UNILATERAL SALPINGECTOMY;  Surgeon: Eveline Lynwood MATSU, MD;  Location: [facility] OR;  Service: Gynecology;  Laterality: Right;    Current Medications: No outpatient medications have been marked as taking for the 09/20/23 encounter (Appointment) with Kate Lonni CROME, MD.     Allergies:   Ibuprofen   Social History   Socioeconomic History   Marital status: Married    Spouse name: Not on file   Number of children: Not on file   Years of education: Not on file   Highest education level: Not  on file  Occupational History   Not on file  Tobacco Use   Smoking status: Never   Smokeless tobacco: Never  Vaping Use   Vaping status: Never Used  Substance and Sexual Activity   Alcohol use: Not Currently    Comment: social   Drug use: No   Sexual activity: Not Currently  Other Topics Concern   Not on file  Social History Narrative   Not on file   Social Drivers of Health   Financial Resource Strain: Not on file  Food Insecurity: No Food Insecurity (11/06/2020)    Hunger Vital Sign    Worried About Running Out of Food in the Last Year: Never true    Ran Out of Food in the Last Year: Never true  Transportation Needs: No Transportation Needs (11/06/2020)   PRAPARE - Administrator, Civil Service (Medical): No    Lack of Transportation (Non-Medical): No  Physical Activity: Not on file  Stress: Not on file  Social Connections: Not on file     Family History: The patient's ***family history includes Healthy in her mother.  ROS:   Please see the history of present illness.    *** All other systems reviewed and are negative.  EKGs/Labs/Other Studies Reviewed:    The following studies were reviewed today: ***  EKG:  EKG is *** ordered today.  The ekg ordered today demonstrates ***  Recent Labs: 01/19/2023: TSH 2.503 04/28/2023: ALT 78 07/04/2023: BUN 9; Creatinine, Ser 0.80; Hemoglobin 11.4; Platelets 350; Potassium 4.1; Sodium 141  Recent Lipid Panel    Component Value Date/Time   CHOL 42 03/30/2016 0254   TRIG 42 03/30/2016 0254   HDL 10 (L) 03/30/2016 0254   CHOLHDL 4.2 03/30/2016 0254   VLDL 8 03/30/2016 0254   LDLCALC 24 03/30/2016 0254    Physical Exam:    VS:  LMP 04/07/2020 (Approximate)     Wt Readings from Last 3 Encounters:  04/28/23 200 lb (90.7 kg)  03/08/23 187 lb 4.8 oz (85 kg)  01/20/23 191 lb 6.4 oz (86.8 kg)     GEN: *** Well nourished, well developed in no acute distress HEENT: Normal NECK: No JVD; No carotid bruits LYMPHATICS: No lymphadenopathy CARDIAC: ***RRR, no murmurs, rubs, gallops RESPIRATORY:  Clear to auscultation without rales, wheezing or rhonchi  ABDOMEN: Soft, non-tender, non-distended MUSCULOSKELETAL:  No edema; No deformity  SKIN: Warm and dry NEUROLOGIC:  Alert and oriented x 3 PSYCHIATRIC:  Normal affect   ASSESSMENT:    No diagnosis found. PLAN:    Chest pain/DOE:  RTC in***   Medication Adjustments/Labs and Tests Ordered: Current medicines are reviewed at length  with the patient today.  Concerns regarding medicines are outlined above.  No orders of the defined types were placed in this encounter.  No orders of the defined types were placed in this encounter.   There are no Patient Instructions on file for this visit.   Signed, Lonni LITTIE Nanas, MD  09/20/2023 5:12 AM    [facility]

## 2023-09-26 ENCOUNTER — Ambulatory Visit: Payer: Medicaid Other | Admitting: Internal Medicine

## 2023-10-15 DIAGNOSIS — Z419 Encounter for procedure for purposes other than remedying health state, unspecified: Secondary | ICD-10-CM | POA: Diagnosis not present

## 2023-11-26 DIAGNOSIS — Z419 Encounter for procedure for purposes other than remedying health state, unspecified: Secondary | ICD-10-CM | POA: Diagnosis not present

## 2023-12-26 DIAGNOSIS — Z419 Encounter for procedure for purposes other than remedying health state, unspecified: Secondary | ICD-10-CM | POA: Diagnosis not present

## 2024-01-26 DIAGNOSIS — Z419 Encounter for procedure for purposes other than remedying health state, unspecified: Secondary | ICD-10-CM | POA: Diagnosis not present

## 2024-02-25 DIAGNOSIS — Z419 Encounter for procedure for purposes other than remedying health state, unspecified: Secondary | ICD-10-CM | POA: Diagnosis not present

## 2024-03-27 DIAGNOSIS — Z419 Encounter for procedure for purposes other than remedying health state, unspecified: Secondary | ICD-10-CM | POA: Diagnosis not present

## 2024-04-27 DIAGNOSIS — Z419 Encounter for procedure for purposes other than remedying health state, unspecified: Secondary | ICD-10-CM | POA: Diagnosis not present

## 2024-07-31 ENCOUNTER — Ambulatory Visit (HOSPITAL_COMMUNITY)
Admission: EM | Admit: 2024-07-31 | Discharge: 2024-07-31 | Disposition: A | Discharge status: Home / Self Care | Payer: Self-pay

## 2024-07-31 ENCOUNTER — Ambulatory Visit (HOSPITAL_COMMUNITY): Payer: Self-pay

## 2024-07-31 ENCOUNTER — Encounter (HOSPITAL_COMMUNITY): Payer: Self-pay | Admitting: *Deleted

## 2024-07-31 ENCOUNTER — Other Ambulatory Visit: Payer: Self-pay

## 2024-07-31 DIAGNOSIS — R112 Nausea with vomiting, unspecified: Secondary | ICD-10-CM | POA: Insufficient documentation

## 2024-07-31 DIAGNOSIS — R197 Diarrhea, unspecified: Secondary | ICD-10-CM

## 2024-07-31 DIAGNOSIS — R634 Abnormal weight loss: Secondary | ICD-10-CM | POA: Insufficient documentation

## 2024-07-31 LAB — CBC WITH DIFFERENTIAL/PLATELET
Abs Immature Granulocytes: 0.01 K/uL (ref 0.00–0.07)
Basophils Absolute: 0 K/uL (ref 0.0–0.1)
Basophils Relative: 1 %
Eosinophils Absolute: 0 K/uL (ref 0.0–0.5)
Eosinophils Relative: 1 %
HCT: 32.8 % — ABNORMAL LOW (ref 36.0–46.0)
Hemoglobin: 10.2 g/dL — ABNORMAL LOW (ref 12.0–15.0)
Immature Granulocytes: 0 %
Lymphocytes Relative: 28 %
Lymphs Abs: 1.4 K/uL (ref 0.7–4.0)
MCH: 27.3 pg (ref 26.0–34.0)
MCHC: 31.1 g/dL (ref 30.0–36.0)
MCV: 87.9 fL (ref 80.0–100.0)
Monocytes Absolute: 0.6 K/uL (ref 0.1–1.0)
Monocytes Relative: 12 %
Neutro Abs: 3.1 K/uL (ref 1.7–7.7)
Neutrophils Relative %: 58 %
Platelets: 189 K/uL (ref 150–400)
RBC: 3.73 MIL/uL — ABNORMAL LOW (ref 3.87–5.11)
RDW: 15.9 % — ABNORMAL HIGH (ref 11.5–15.5)
WBC: 5.2 K/uL (ref 4.0–10.5)
nRBC: 0 % (ref 0.0–0.2)

## 2024-07-31 LAB — POCT URINE DIPSTICK
Bilirubin, UA: NEGATIVE
Blood, UA: NEGATIVE
Glucose, UA: NEGATIVE mg/dL
Ketones, POC UA: NEGATIVE mg/dL
Leukocytes, UA: NEGATIVE
Nitrite, UA: NEGATIVE
Protein Ur, POC: NEGATIVE mg/dL
Spec Grav, UA: 1.015 (ref 1.010–1.025)
Urobilinogen, UA: 0.2 U/dL
pH, UA: 6 (ref 5.0–8.0)

## 2024-07-31 LAB — COMPREHENSIVE METABOLIC PANEL WITH GFR
ALT: 79 U/L — ABNORMAL HIGH (ref 0–44)
AST: 65 U/L — ABNORMAL HIGH (ref 15–41)
Albumin: 3.7 g/dL (ref 3.5–5.0)
Alkaline Phosphatase: 82 U/L (ref 38–126)
Anion gap: 9 (ref 5–15)
BUN: 14 mg/dL (ref 6–20)
CO2: 25 mmol/L (ref 22–32)
Calcium: 9.1 mg/dL (ref 8.9–10.3)
Chloride: 107 mmol/L (ref 98–111)
Creatinine, Ser: 0.76 mg/dL (ref 0.44–1.00)
GFR, Estimated: >60 mL/min (ref >60)
Glucose, Bld: 90 mg/dL (ref 70–99)
Potassium: 4.1 mmol/L (ref 3.5–5.1)
Sodium: 141 mmol/L (ref 135–145)
Total Bilirubin: 0.3 mg/dL (ref 0.0–1.2)
Total Protein: 6.5 g/dL (ref 6.5–8.1)

## 2024-07-31 LAB — LIPASE, BLOOD: Lipase: <10 U/L — ABNORMAL LOW (ref 11–51)

## 2024-07-31 LAB — T4, FREE: Free T4: 1.04 ng/dL (ref 0.80–2.00)

## 2024-07-31 LAB — TSH: TSH: 0.682 u[IU]/mL (ref 0.350–4.500)

## 2024-07-31 MED ORDER — ONDANSETRON 4 MG PO TBDP
4.0000 mg | ORAL_TABLET | Freq: Once | ORAL | Status: AC
  Administered 2024-07-31: 18:00:00 4 mg via ORAL

## 2024-07-31 MED ORDER — ONDANSETRON 4 MG PO TBDP: 4.0000 mg | ORAL_TABLET | Freq: Three times a day (TID) | ORAL | 0 refills | Status: AC | PRN

## 2024-07-31 MED ORDER — DICYCLOMINE HCL 10 MG PO CAPS: 10.0000 mg | ORAL_CAPSULE | Freq: Three times a day (TID) | ORAL | 0 refills | Status: AC

## 2024-07-31 MED ORDER — ONDANSETRON 4 MG PO TBDP
ORAL_TABLET | ORAL | Status: AC
  Filled 2024-07-31: qty 1

## 2024-07-31 NOTE — Discharge Instructions (Addendum)
 Your lab work will be sent out.  You will be notified via telephone for any abnormalities. Abdominal xray shows constipation. Urinalysis is negative.  Take Miralax daily. Take a fiber gummy daily. Increase fluid intake. You have been prescribed ondansetron .  Take 1 tablet every 8 hours as needed for nausea/vomiting. You have been prescribed dicyclomine .  Take 1 capsule three times daily before meals for abdominal pain/cramps.  You need to follow up with gastroenterology (GI).  Contact information: [facility] [address] [place]  3rd floor [place], [place]  [address] [phone]  If you develop any new or worsening symptoms or if your symptoms do not start to improve, please return here or follow-up with primary care provider.  If your symptoms are severe, please go to the emergency room.

## 2024-07-31 NOTE — ED Triage Notes (Addendum)
 C/O fatigue, generalized weakness, abd cramping, night sweats, vomiting, diarrhea for months, getting worse over past couple weeks. Reports weight loss -- states has gone from size 14 to size 10 over past 3 months. Denies fevers.

## 2024-07-31 NOTE — ED Provider Notes (Signed)
 [facility]    CSN: [id number] Arrival date & time: 07/31/24  1629      History   Chief Complaint Chief Complaint  Patient presents with   Weight Loss    Appt 1630   Fatigue    HPI Cheryl Holloway is a 48 y.o. female.   This 48 year old female is being seen for complaints of nausea, vomiting, diarrhea, and abdominal cramping for approximately 3 weeks.  She reports weight loss over the last 3 months for.  She reports weight has decreased from 180 pounds to approximately 130 pounds.  She reports abdominal cramping.  She says she vomits every day, typically directly after eating or drinking.  She says it does not appear to matter what she eats.  She denies blood in her vomit or stool.  She reports chills, night sweats.  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  She has a hx of anemia.  She takes a multivitamin.  She reports intermittent dizziness, blurry vision.  She denies syncope.  She denies chest pain, shortness of breath, cough.  Denies urinary symptoms.  She does not have a PCP.  She is agreeable to receiving PCP assistance.  Upon chart review, she has been seen for same symptoms previously in 2024.  She was advised to follow-up with GI at that time.  She says she has been unable to follow-up with GI.     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   History of kidney stones 1994   History of mastectomy 04/25/2014   Malignant neoplasm of female breast (HCC) 09/30/2015   Post-operative state 01/27/2014   2009     Ruptured right breast implant, initial encounter 02/22/2014   Upper gastrointestinal bleeding 01/01/2016    Patient Active Problem List   Diagnosis Date Noted   S/P abdominal supracervical subtotal hysterectomy 03/08/2023    Steatosis of liver 01/01/2016   S/P mastectomy 04/25/2014   Breast cancer, right (HCC) 01/27/2014   Gastric bypass status for obesity 01/27/2014   Iron deficiency anemia due to chronic blood loss 01/27/2014   Breast CA (HCC) 01/27/2014    Past Surgical History:  Procedure Laterality Date   BREAST IMPLANT REMOVAL Right 04/25/2014   Procedure: REMOVAL BREAST IMPLANT WITH CAPSULECTOMY;  Surgeon: Estefana Reichert, DO;  Location: [facility];  Service: Plastics;  Laterality: Right;   BREAST RECONSTRUCTION     BREAST REDUCTION SURGERY     CESAREAN SECTION     x3   CYSTOSCOPY N/A 10/07/2020   Procedure: CYSTOSCOPY;  Surgeon: Eveline Lynwood MATSU, MD;  Location: [facility] OR;  Service: Gynecology;  Laterality: N/A;   DILATION AND CURETTAGE OF UTERUS  1993   ESOPHAGOGASTRODUODENOSCOPY N/A 01/02/2016   Procedure: ESOPHAGOGASTRODUODENOSCOPY (EGD);  Surgeon: Oliva Boots, MD;  Location: [facility] ENDOSCOPY;  Service: Endoscopy;  Laterality: N/A;   GASTRIC BYPASS     LAPAROSCOPY N/A 10/07/2020   Procedure: LAPAROSCOPY DIAGNOSTIC;  Surgeon: Eveline Lynwood MATSU, MD;  Location: [facility] OR;  Service: Gynecology;  Laterality: N/A;   MASTECTOMY     right   ORIF ANKLE FRACTURE Right 04/14/2021   Procedure: OPEN REDUCTION INTERNAL FIXATION (ORIF) RIGHT ANKLE FRACTURE WITH SYNDESMOTIC FIXATION;  Surgeon: Sharl Selinda Dover, MD;  Location: [facility] OR;  Service: Orthopedics;  Laterality: Right;   SIMPLE MASTECTOMY WITH AXILLARY SENTINEL NODE BIOPSY Left 04/25/2014   Procedure: LEFT TOTAL  MASTECTOMY (PROPHYLACTIC);  Surgeon: Donnice Bury, MD;  Location: [facility];  Service: General;  Laterality: Left;   SUPRACERVICAL ABDOMINAL HYSTERECTOMY N/A 10/07/2020   Procedure: HYSTERECTOMY SUPRACERVICAL ABDOMINAL;  Surgeon: Eveline Lynwood MATSU, MD;  Location: [facility] OR;  Service: Gynecology;  Laterality: N/A;   TUBAL LIGATION     UNILATERAL SALPINGECTOMY Right 10/07/2020   Procedure: UNILATERAL SALPINGECTOMY;  Surgeon: Eveline Lynwood MATSU, MD;  Location: [facility] OR;  Service: Gynecology;  Laterality: Right;    OB History     Gravida  4   Para  3   Term  3   Preterm      AB  1   Living  3      SAB  1   IAB      Ectopic      Multiple      Live Births  3            Home Medications    Prior to Admission medications  Medication Sig Start Date End Date Taking? Authorizing Provider  dicyclomine  (BENTYL ) 10 MG capsule Take 1 capsule (10 mg total) by mouth 3 (three) times daily before meals. 07/31/24  Yes Trenyce Loera C, FNP  Multiple Vitamin (MULTIVITAMIN ADULT PO) Take 1 tablet by mouth daily. prenatel   Yes [provider]  ondansetron  (ZOFRAN -ODT) 4 MG disintegrating tablet Take 1 tablet (4 mg total) by mouth every 8 (eight) hours as needed for nausea or vomiting. 07/31/24  Yes Courtny Bennison, Jon BROCKS, FNP  Fiber Adult Gummies 2 g CHEW Chew 1 tablet by mouth daily.    [provider]    Family History Family History  Problem Relation Age of Onset   Healthy Mother     Social History Social History[1]   Allergies   Ibuprofen   Review of Systems Review of Systems  Constitutional:  Positive for chills and unexpected weight change. Negative for activity change, appetite change and fever.  Eyes:  Positive for visual disturbance.  Respiratory:  Negative for cough and shortness of breath.   Cardiovascular:  Negative for chest pain.  Gastrointestinal:  Positive for abdominal pain, diarrhea, nausea and vomiting. Negative for blood in stool.  Genitourinary:  Negative for difficulty urinating, dysuria, frequency, hematuria and urgency.  Musculoskeletal:  Negative for back pain.  Skin:  Negative for color change and rash.  Neurological:  Positive for dizziness. Negative for syncope and headaches.  All other systems reviewed and are negative.    Physical Exam Triage Vital Signs ED Triage Vitals  Encounter Vitals Group     BP 07/31/24 1700 120/84     Girls Systolic BP  Percentile --      Girls Diastolic BP Percentile --      Boys Systolic BP Percentile --      Boys Diastolic BP Percentile --      Pulse Rate 07/31/24 1700 63     Resp 07/31/24 1700 16     Temp 07/31/24 1700 98.1 F (36.7 C)     Temp Source 07/31/24 1700 Oral     SpO2 07/31/24 1700 98 %     Weight --      Height --      Head Circumference --      Peak Flow --      Pain Score 07/31/24 1701 0  Pain Loc --      Pain Education --      Exclude from Growth Chart --    No data found.  Updated Vital Signs BP 120/84   Pulse 63   Temp 98.1 F (36.7 C) (Oral)   Resp 16   LMP 04/07/2020   SpO2 98%   Visual Acuity Right Eye Distance:   Left Eye Distance:   Bilateral Distance:    Right Eye Near:   Left Eye Near:    Bilateral Near:     Physical Exam Vitals and nursing note reviewed.  Constitutional:      General: She is awake. She is not in acute distress.    Appearance: She is well-developed. She is not ill-appearing or toxic-appearing.     Comments: Pleasant female appearing stated age is found sitting in chair in no acute distress.  HENT:     Head: Normocephalic and atraumatic.     Mouth/Throat:     Lips: Pink.  Eyes:     Conjunctiva/sclera: Conjunctivae normal.  Cardiovascular:     Rate and Rhythm: Normal rate and regular rhythm.     Heart sounds: Normal heart sounds. No murmur heard. Pulmonary:     Effort: Pulmonary effort is normal. No respiratory distress.     Breath sounds: Normal breath sounds.  Abdominal:     General: Bowel sounds are normal. There is no distension.     Palpations: Abdomen is soft.     Tenderness: There is generalized abdominal tenderness. There is no guarding.  Skin:    General: Skin is warm and dry.     Capillary Refill: Capillary refill takes less than 2 seconds.  Neurological:     Mental Status: She is alert.  Psychiatric:        Mood and Affect: Mood normal.        Behavior: Behavior is cooperative.      UC Treatments /  Results  Labs (all labs ordered are listed, but only abnormal results are displayed) Labs Reviewed  POCT URINE DIPSTICK - Normal  CBC WITH DIFFERENTIAL/PLATELET  COMPREHENSIVE METABOLIC PANEL WITH GFR  LIPASE, BLOOD  TSH  T4, FREE    EKG   Radiology DG Abd 1 View Result Date: 07/31/2024 CLINICAL DATA:  emesis EXAM: ABDOMEN - 1 VIEW COMPARISON:  January 20, 2023 FINDINGS: Surgical changes in the epigastrium, consistent with prior gastric bypass. Nonobstructive bowel gas pattern.Moderate volume fecal loading throughout the colon.No pneumoperitoneum. No organomegaly or radiopaque calculi. No acute fracture or destructive lesion. Multilevel thoracolumbar osteophytosis. IMPRESSION: 1. Nonobstructive bowel gas pattern. 2. Moderate volume fecal loading throughout the colon, as can be seen in constipation. Electronically Signed   By: Rogelia Myers M.D.   On: 07/31/2024 18:15    Procedures Procedures (including critical care time)  Medications Ordered in UC Medications  ondansetron  (ZOFRAN -ODT) disintegrating tablet 4 mg (4 mg Oral Given 07/31/24 1747)    Initial Impression / Assessment and Plan / UC Course  I have reviewed the triage vital signs and the nursing notes.  Pertinent labs & imaging results that were available during my care of the patient were reviewed by me and considered in my medical decision making (see chart for details).     Vitals and triage reviewed, patient is hemodynamically stable.  There is no indication for emergent evaluation or imaging today.  UA obtained and is negative.  Abdominal x-ray obtained which reveals constipation.  Obtained CBC, CMP, TSH, free T4.  She will be  notified for any abnormalities.  She is given ondansetron  in clinic.  She had a successful oral challenge.  Prescription given for ondansetron  and Bentyl .  Advised to eat a bland diet and drink plenty of fluids.  Discussed that she will need to follow-up with GI for further evaluation and  management.  Provided with contact information.  She does not have a primary care provider, therefore PCP assistance initiated.  Plan of care, follow-up care, return precautions given, no questions at this time. Final Clinical Impressions(s) / UC Diagnoses   Final diagnoses:  Nausea and vomiting, unspecified vomiting type  Diarrhea, unspecified type  Unintentional weight loss     Discharge Instructions      Your lab work will be sent out.  You will be notified via telephone for any abnormalities. Abdominal xray shows constipation. Urinalysis is negative.  Take Miralax daily. Take a fiber gummy daily. Increase fluid intake. You have been prescribed ondansetron .  Take 1 tablet every 8 hours as needed for nausea/vomiting. You have been prescribed dicyclomine .  Take 1 capsule three times daily before meals for abdominal pain/cramps.  You need to follow up with gastroenterology (GI).  Contact information: [facility] [address] [place]  3rd floor [place], [place]  [address] [phone]  If you develop any new or worsening symptoms or if your symptoms do not start to improve, please return here or follow-up with primary care provider.  If your symptoms are severe, please go to the emergency room.     ED Prescriptions     Medication Sig Dispense Auth. Provider   ondansetron  (ZOFRAN -ODT) 4 MG disintegrating tablet Take 1 tablet (4 mg total) by mouth every 8 (eight) hours as needed for nausea or vomiting. 20 tablet Mills Mitton C, FNP   dicyclomine  (BENTYL ) 10 MG capsule Take 1 capsule (10 mg total) by mouth 3 (three) times daily before meals. 90 capsule Nelton Amsden C, FNP      I have reviewed the PDMP during this encounter.     [1]  Social History Tobacco Use   Smoking status: Never   Smokeless tobacco: Never  Vaping Use   Vaping status: Never Used  Substance Use Topics   Alcohol use: Not Currently    Comment: social   Drug use: No     Lennice Jon BROCKS,  FNP 07/31/24 1850

## 2024-08-01 ENCOUNTER — Encounter: Payer: Self-pay | Admitting: Physician Assistant

## 2024-08-01 ENCOUNTER — Ambulatory Visit (HOSPITAL_COMMUNITY): Payer: Self-pay

## 2024-08-03 NOTE — Telephone Encounter (Signed)
 Pt returned call. Advised of results and PA recs. Verbalized understanding.

## 2024-08-27 ENCOUNTER — Ambulatory Visit: Payer: Self-pay | Admitting: Medical

## 2024-09-05 NOTE — Progress Notes (Unsigned)
 "    09/05/2024 Marquel Spoto [id number] 03/23/1976  Referring provider: No ref. provider found Primary GI doctor: {[id number]}  ASSESSMENT AND PLAN:  Diarrhea, nausea, vomiting, weight loss, constipation on KUB 01/20/2023 CTAP W severe fatty liver infiltration moderate colonic stool burden prior gastric bypass with small gastric component of gastrojejunostomy redundant colon and sigmoid region fluid-filled loops and small bowel moderate atrophy of the pancreas unremarkable gallbladder 07/31/2024 labs at UC show anemia, elevated liver function, normal thyroid  and lipase 07/21/2024 KUB shows constipation  Anemia with history of gastric bypass 07/31/2024  HGB 10.2 MCV 87.9 Platelets 189 Recent Labs    07/31/24 1723  HGB 10.2*   Personal history of breast cancer 2017 Status postmastectomy  Elevated liver function with steatosis 01/20/2023 CTAP W severe fatty liver infiltration moderate colonic stool burden prior gastric bypass with small gastric component of gastrojejunostomy redundant colon and sigmoid region fluid-filled loops and small bowel moderate atrophy of the pancreas unremarkable gallbladder    Latest Ref Rng & Units 07/31/2024    5:23 PM 04/28/2023    9:37 AM 01/20/2023    9:27 AM  Hepatic Function  Total Protein 6.5 - 8.1 g/dL 6.5  7.1  5.9   Albumin  3.5 - 5.0 g/dL 3.7  3.7  2.9   AST 15 - 41 U/L 65  115  287   ALT 0 - 44 U/L 79  78  172   Alk Phosphatase 38 - 126 U/L 82  119  134   Total Bilirubin 0.0 - 1.2 mg/dL 0.3  1.2  0.5    Platelets 189  INR 01/20/2023 Fib four 1.20 advanced fibrosis excluded from labs 07/31/2024 Patient Care Team: Patient, No Pcp Per as PCP - General (General Practice)  HISTORY OF PRESENT ILLNESS: 49 y.o. female with a past medical history listed below presents for evaluation of ***.   *** Discussed the use of AI scribe software for clinical note transcription with the patient, who gave verbal consent to proceed.  History of Present Illness             She  reports that she has never smoked. She has never used smokeless tobacco. She reports that she does not currently use alcohol. She reports that she does not use drugs.  RELEVANT GI HISTORY, IMAGING AND LABS: Results          CBC    Component Value Date/Time   WBC 5.2 07/31/2024 1723   RBC 3.73 (L) 07/31/2024 1723   HGB 10.2 (L) 07/31/2024 1723   HGB 8.8 Repeated and Verified (L) 09/29/2015 0912   HCT 32.8 (L) 07/31/2024 1723   HCT 27.1 (L) 09/29/2015 0912   PLT 189 07/31/2024 1723   PLT 148 09/29/2015 0912   MCV 87.9 07/31/2024 1723   MCV 79.5 09/29/2015 0912   MCH 27.3 07/31/2024 1723   MCHC 31.1 07/31/2024 1723   RDW 15.9 (H) 07/31/2024 1723   RDW 16.1 (H) 09/29/2015 0912   LYMPHSABS 1.4 07/31/2024 1723   LYMPHSABS 1.2 09/29/2015 0912   MONOABS 0.6 07/31/2024 1723   MONOABS 0.4 09/29/2015 0912   EOSABS 0.0 07/31/2024 1723   EOSABS 0.0 09/29/2015 0912   BASOSABS 0.0 07/31/2024 1723   BASOSABS 0.0 09/29/2015 0912   Recent Labs    07/31/24 1723  HGB 10.2*    CMP     Component Value Date/Time   NA 141 07/31/2024 1723   NA 141 09/29/2015 0912   K 4.1 07/31/2024 1723   K 4.0  09/29/2015 0912   CL 107 07/31/2024 1723   CO2 25 07/31/2024 1723   CO2 24 09/29/2015 0912   GLUCOSE 90 07/31/2024 1723   GLUCOSE 104 09/29/2015 0912   BUN 14 07/31/2024 1723   BUN <4.0 (L) 09/29/2015 0912   CREATININE 0.76 07/31/2024 1723   CREATININE 0.7 09/29/2015 0912   CALCIUM 9.1 07/31/2024 1723   CALCIUM 7.2 (L) 09/29/2015 0912   PROT 6.5 07/31/2024 1723   PROT 6.0 (L) 09/29/2015 0912   ALBUMIN  3.7 07/31/2024 1723   ALBUMIN  2.4 (L) 09/29/2015 0912   AST 65 (H) 07/31/2024 1723   AST 148 (H) 09/29/2015 0912   ALT 79 (H) 07/31/2024 1723   ALT 101 (H) 09/29/2015 0912   ALKPHOS 82 07/31/2024 1723   ALKPHOS 135 09/29/2015 0912   BILITOT 0.3 07/31/2024 1723   BILITOT 0.61 09/29/2015 0912   GFRNONAA >60 07/31/2024 1723   GFRAA >60 03/30/2016 0254      Latest  Ref Rng & Units 07/31/2024    5:23 PM 04/28/2023    9:37 AM 01/20/2023    9:27 AM  Hepatic Function  Total Protein 6.5 - 8.1 g/dL 6.5  7.1  5.9   Albumin  3.5 - 5.0 g/dL 3.7  3.7  2.9   AST 15 - 41 U/L 65  115  287   ALT 0 - 44 U/L 79  78  172   Alk Phosphatase 38 - 126 U/L 82  119  134   Total Bilirubin 0.0 - 1.2 mg/dL 0.3  1.2  0.5       Current Medications:   Current Outpatient Medications (Other):    dicyclomine  (BENTYL ) 10 MG capsule, Take 1 capsule (10 mg total) by mouth 3 (three) times daily before meals.   Fiber Adult Gummies 2 g CHEW, Chew 1 tablet by mouth daily.   Multiple Vitamin (MULTIVITAMIN ADULT PO), Take 1 tablet by mouth daily. prenatel   ondansetron  (ZOFRAN -ODT) 4 MG disintegrating tablet, Take 1 tablet (4 mg total) by mouth every 8 (eight) hours as needed for nausea or vomiting.  Medical History:  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   History of kidney stones 1994   History of mastectomy 04/25/2014   Malignant neoplasm of female breast (HCC) 09/30/2015   Post-operative state 01/27/2014   2009     Ruptured right breast implant, initial encounter 02/22/2014   Upper gastrointestinal bleeding 01/01/2016   Allergies: Allergies[1]   Surgical History:  She  has a past surgical history that includes Breast reconstruction; Breast reduction surgery; Gastric bypass; Tubal ligation; Cesarean section; Mastectomy; Simple mastectomy with axillary sentinel node biopsy (Left, 04/25/2014); Breast implant removal (Right, 04/25/2014); Esophagogastroduodenoscopy (N/A, 01/02/2016); Dilation and curettage of uterus (1993); Cystoscopy (N/A, 10/07/2020); Supracervical abdominal hysterectomy (N/A, 10/07/2020); laparoscopy (N/A, 10/07/2020); Unilateral salpingectomy (Right, 10/07/2020);  and ORIF ankle fracture (Right, 04/14/2021). Family History:  Her family history includes Healthy in her mother.  REVIEW OF SYSTEMS  : All other systems reviewed and negative except where noted in the History of Present Illness.  PHYSICAL EXAM: LMP 04/07/2020  Physical Exam          Alan JONELLE Coombs, PA-C 1:24 PM      [1]  Allergies Allergen Reactions   Ibuprofen Hives   "

## 2024-09-06 ENCOUNTER — Ambulatory Visit: Payer: Self-pay | Admitting: Physician Assistant

## 2024-09-18 ENCOUNTER — Encounter: Payer: Self-pay | Admitting: Internal Medicine

## 2024-10-05 ENCOUNTER — Ambulatory Visit: Payer: Self-pay | Admitting: Physician Assistant

## 2024-10-12 ENCOUNTER — Ambulatory Visit: Payer: Self-pay | Admitting: Medical

## 2024-10-12 ENCOUNTER — Ambulatory Visit: Payer: Self-pay | Admitting: Physician Assistant
